# Patient Record
Sex: Female | Born: 1945 | Race: White | Hispanic: No | Marital: Married | Smoking: Never smoker
Health system: Southern US, Community
[De-identification: ages and names within clinical notes are randomized; demographics above are authoritative.]

## PROBLEM LIST (undated history)

## (undated) DIAGNOSIS — E039 Hypothyroidism, unspecified: Secondary | ICD-10-CM

## (undated) DIAGNOSIS — R51 Headache: Secondary | ICD-10-CM

## (undated) DIAGNOSIS — Z9889 Other specified postprocedural states: Secondary | ICD-10-CM

## (undated) DIAGNOSIS — C801 Malignant (primary) neoplasm, unspecified: Secondary | ICD-10-CM

## (undated) DIAGNOSIS — R112 Nausea with vomiting, unspecified: Secondary | ICD-10-CM

## (undated) DIAGNOSIS — R519 Headache, unspecified: Secondary | ICD-10-CM

## (undated) DIAGNOSIS — K219 Gastro-esophageal reflux disease without esophagitis: Secondary | ICD-10-CM

## (undated) DIAGNOSIS — M1990 Unspecified osteoarthritis, unspecified site: Secondary | ICD-10-CM

## (undated) DIAGNOSIS — C50919 Malignant neoplasm of unspecified site of unspecified female breast: Secondary | ICD-10-CM

## (undated) DIAGNOSIS — E079 Disorder of thyroid, unspecified: Secondary | ICD-10-CM

## (undated) DIAGNOSIS — I10 Essential (primary) hypertension: Secondary | ICD-10-CM

## (undated) HISTORY — PX: TONSILLECTOMY: SUR1361

## (undated) HISTORY — PX: DILATION AND CURETTAGE OF UTERUS: SHX78

## (undated) HISTORY — PX: BREAST SURGERY: SHX581

## (undated) HISTORY — PX: ABDOMINAL HYSTERECTOMY: SHX81

## (undated) HISTORY — PX: CHOLECYSTECTOMY: SHX55

## (undated) HISTORY — DX: Malignant neoplasm of unspecified site of unspecified female breast: C50.919

---

## 1974-12-02 HISTORY — PX: KNEE SURGERY: SHX244

## 1998-10-19 ENCOUNTER — Ambulatory Visit (HOSPITAL_COMMUNITY): Admission: RE | Admit: 1998-10-19 | Discharge: 1998-10-19 | Payer: Self-pay | Admitting: Neurosurgery

## 1998-10-19 ENCOUNTER — Encounter: Payer: Self-pay | Admitting: Neurosurgery

## 1998-11-08 ENCOUNTER — Ambulatory Visit (HOSPITAL_COMMUNITY): Admission: RE | Admit: 1998-11-08 | Discharge: 1998-11-08 | Payer: Self-pay | Admitting: Neurosurgery

## 1998-11-08 ENCOUNTER — Encounter: Payer: Self-pay | Admitting: Neurosurgery

## 1999-12-03 HISTORY — PX: BACK SURGERY: SHX140

## 2004-09-03 ENCOUNTER — Ambulatory Visit: Payer: Self-pay | Admitting: General Surgery

## 2005-09-02 ENCOUNTER — Ambulatory Visit: Payer: Self-pay | Admitting: General Surgery

## 2006-09-10 ENCOUNTER — Ambulatory Visit: Payer: Self-pay | Admitting: General Surgery

## 2006-09-12 ENCOUNTER — Ambulatory Visit: Payer: Self-pay | Admitting: General Surgery

## 2007-10-06 ENCOUNTER — Ambulatory Visit: Payer: Self-pay | Admitting: General Surgery

## 2008-10-11 ENCOUNTER — Ambulatory Visit: Payer: Self-pay | Admitting: General Surgery

## 2009-11-07 ENCOUNTER — Ambulatory Visit: Payer: Self-pay

## 2011-01-22 ENCOUNTER — Ambulatory Visit: Payer: Self-pay

## 2011-08-23 ENCOUNTER — Ambulatory Visit: Payer: Self-pay

## 2012-04-14 ENCOUNTER — Ambulatory Visit: Payer: Self-pay

## 2013-06-08 ENCOUNTER — Ambulatory Visit: Payer: Self-pay

## 2013-12-02 HISTORY — PX: MASTECTOMY: SHX3

## 2014-04-06 DIAGNOSIS — I10 Essential (primary) hypertension: Secondary | ICD-10-CM | POA: Insufficient documentation

## 2014-04-06 DIAGNOSIS — M47816 Spondylosis without myelopathy or radiculopathy, lumbar region: Secondary | ICD-10-CM | POA: Insufficient documentation

## 2014-04-06 DIAGNOSIS — K219 Gastro-esophageal reflux disease without esophagitis: Secondary | ICD-10-CM | POA: Insufficient documentation

## 2014-04-06 DIAGNOSIS — E039 Hypothyroidism, unspecified: Secondary | ICD-10-CM | POA: Insufficient documentation

## 2014-05-09 DIAGNOSIS — J310 Chronic rhinitis: Secondary | ICD-10-CM | POA: Insufficient documentation

## 2014-05-09 DIAGNOSIS — E785 Hyperlipidemia, unspecified: Secondary | ICD-10-CM | POA: Insufficient documentation

## 2014-06-21 ENCOUNTER — Ambulatory Visit: Payer: Self-pay | Admitting: Internal Medicine

## 2014-06-24 ENCOUNTER — Ambulatory Visit: Payer: Self-pay | Admitting: Internal Medicine

## 2014-06-28 ENCOUNTER — Ambulatory Visit: Payer: Self-pay | Admitting: Internal Medicine

## 2014-07-01 ENCOUNTER — Ambulatory Visit: Payer: Self-pay | Admitting: Internal Medicine

## 2014-07-02 ENCOUNTER — Ambulatory Visit: Payer: Self-pay | Admitting: Internal Medicine

## 2014-07-04 LAB — PATHOLOGY REPORT

## 2014-07-13 DIAGNOSIS — C50919 Malignant neoplasm of unspecified site of unspecified female breast: Secondary | ICD-10-CM | POA: Insufficient documentation

## 2014-08-02 ENCOUNTER — Ambulatory Visit: Payer: Self-pay | Admitting: Internal Medicine

## 2015-01-03 DIAGNOSIS — C50911 Malignant neoplasm of unspecified site of right female breast: Secondary | ICD-10-CM | POA: Diagnosis not present

## 2015-01-24 DIAGNOSIS — M25511 Pain in right shoulder: Secondary | ICD-10-CM | POA: Diagnosis not present

## 2015-01-24 DIAGNOSIS — K219 Gastro-esophageal reflux disease without esophagitis: Secondary | ICD-10-CM | POA: Diagnosis not present

## 2015-01-24 DIAGNOSIS — E039 Hypothyroidism, unspecified: Secondary | ICD-10-CM | POA: Diagnosis not present

## 2015-01-24 DIAGNOSIS — I10 Essential (primary) hypertension: Secondary | ICD-10-CM | POA: Diagnosis not present

## 2015-04-04 DIAGNOSIS — C50911 Malignant neoplasm of unspecified site of right female breast: Secondary | ICD-10-CM | POA: Diagnosis not present

## 2015-04-18 DIAGNOSIS — I10 Essential (primary) hypertension: Secondary | ICD-10-CM | POA: Diagnosis not present

## 2015-04-18 DIAGNOSIS — E039 Hypothyroidism, unspecified: Secondary | ICD-10-CM | POA: Diagnosis not present

## 2015-04-18 DIAGNOSIS — E782 Mixed hyperlipidemia: Secondary | ICD-10-CM | POA: Diagnosis not present

## 2015-04-25 DIAGNOSIS — E039 Hypothyroidism, unspecified: Secondary | ICD-10-CM | POA: Diagnosis not present

## 2015-04-25 DIAGNOSIS — E782 Mixed hyperlipidemia: Secondary | ICD-10-CM | POA: Diagnosis not present

## 2015-04-25 DIAGNOSIS — I10 Essential (primary) hypertension: Secondary | ICD-10-CM | POA: Diagnosis not present

## 2015-07-19 DIAGNOSIS — R51 Headache: Secondary | ICD-10-CM | POA: Diagnosis not present

## 2015-07-31 DIAGNOSIS — C50911 Malignant neoplasm of unspecified site of right female breast: Secondary | ICD-10-CM | POA: Diagnosis not present

## 2015-08-17 DIAGNOSIS — C50911 Malignant neoplasm of unspecified site of right female breast: Secondary | ICD-10-CM | POA: Diagnosis not present

## 2015-08-21 DIAGNOSIS — E039 Hypothyroidism, unspecified: Secondary | ICD-10-CM | POA: Diagnosis not present

## 2015-08-28 DIAGNOSIS — K219 Gastro-esophageal reflux disease without esophagitis: Secondary | ICD-10-CM | POA: Diagnosis not present

## 2015-08-28 DIAGNOSIS — Z23 Encounter for immunization: Secondary | ICD-10-CM | POA: Diagnosis not present

## 2015-08-28 DIAGNOSIS — I10 Essential (primary) hypertension: Secondary | ICD-10-CM | POA: Insufficient documentation

## 2015-08-28 DIAGNOSIS — E782 Mixed hyperlipidemia: Secondary | ICD-10-CM | POA: Insufficient documentation

## 2015-08-28 DIAGNOSIS — E039 Hypothyroidism, unspecified: Secondary | ICD-10-CM | POA: Diagnosis not present

## 2015-10-03 DIAGNOSIS — C50911 Malignant neoplasm of unspecified site of right female breast: Secondary | ICD-10-CM | POA: Diagnosis not present

## 2015-11-06 DIAGNOSIS — E039 Hypothyroidism, unspecified: Secondary | ICD-10-CM | POA: Diagnosis not present

## 2015-11-13 DIAGNOSIS — J302 Other seasonal allergic rhinitis: Secondary | ICD-10-CM | POA: Insufficient documentation

## 2015-11-13 DIAGNOSIS — K219 Gastro-esophageal reflux disease without esophagitis: Secondary | ICD-10-CM | POA: Diagnosis not present

## 2015-11-13 DIAGNOSIS — I10 Essential (primary) hypertension: Secondary | ICD-10-CM | POA: Diagnosis not present

## 2015-11-13 DIAGNOSIS — E559 Vitamin D deficiency, unspecified: Secondary | ICD-10-CM | POA: Insufficient documentation

## 2015-11-13 DIAGNOSIS — E039 Hypothyroidism, unspecified: Secondary | ICD-10-CM | POA: Diagnosis not present

## 2015-11-13 DIAGNOSIS — J3089 Other allergic rhinitis: Secondary | ICD-10-CM | POA: Diagnosis not present

## 2015-12-12 DIAGNOSIS — Z91041 Radiographic dye allergy status: Secondary | ICD-10-CM | POA: Diagnosis not present

## 2015-12-12 DIAGNOSIS — L905 Scar conditions and fibrosis of skin: Secondary | ICD-10-CM | POA: Diagnosis not present

## 2015-12-12 DIAGNOSIS — M79621 Pain in right upper arm: Secondary | ICD-10-CM | POA: Diagnosis not present

## 2015-12-12 DIAGNOSIS — G588 Other specified mononeuropathies: Secondary | ICD-10-CM | POA: Diagnosis not present

## 2015-12-12 DIAGNOSIS — L987 Excessive and redundant skin and subcutaneous tissue: Secondary | ICD-10-CM | POA: Diagnosis not present

## 2015-12-12 DIAGNOSIS — C50911 Malignant neoplasm of unspecified site of right female breast: Secondary | ICD-10-CM | POA: Diagnosis not present

## 2015-12-12 DIAGNOSIS — Z853 Personal history of malignant neoplasm of breast: Secondary | ICD-10-CM | POA: Diagnosis not present

## 2015-12-12 DIAGNOSIS — Z885 Allergy status to narcotic agent status: Secondary | ICD-10-CM | POA: Diagnosis not present

## 2015-12-12 DIAGNOSIS — M799 Soft tissue disorder, unspecified: Secondary | ICD-10-CM | POA: Diagnosis not present

## 2015-12-12 DIAGNOSIS — Z882 Allergy status to sulfonamides status: Secondary | ICD-10-CM | POA: Diagnosis not present

## 2015-12-12 DIAGNOSIS — N644 Mastodynia: Secondary | ICD-10-CM | POA: Diagnosis not present

## 2016-01-16 ENCOUNTER — Emergency Department
Admission: EM | Admit: 2016-01-16 | Discharge: 2016-01-16 | Disposition: A | Discharge status: Home / Self Care | Payer: Commercial Managed Care - HMO | Attending: Emergency Medicine | Admitting: Emergency Medicine

## 2016-01-16 ENCOUNTER — Encounter: Payer: Self-pay | Admitting: Emergency Medicine

## 2016-01-16 ENCOUNTER — Emergency Department: Payer: Commercial Managed Care - HMO

## 2016-01-16 DIAGNOSIS — R05 Cough: Secondary | ICD-10-CM

## 2016-01-16 DIAGNOSIS — K429 Umbilical hernia without obstruction or gangrene: Secondary | ICD-10-CM | POA: Diagnosis not present

## 2016-01-16 DIAGNOSIS — R059 Cough, unspecified: Secondary | ICD-10-CM

## 2016-01-16 DIAGNOSIS — R000 Tachycardia, unspecified: Secondary | ICD-10-CM | POA: Diagnosis not present

## 2016-01-16 DIAGNOSIS — I10 Essential (primary) hypertension: Secondary | ICD-10-CM | POA: Insufficient documentation

## 2016-01-16 DIAGNOSIS — R1031 Right lower quadrant pain: Secondary | ICD-10-CM | POA: Diagnosis present

## 2016-01-16 DIAGNOSIS — F419 Anxiety disorder, unspecified: Secondary | ICD-10-CM | POA: Insufficient documentation

## 2016-01-16 DIAGNOSIS — B349 Viral infection, unspecified: Secondary | ICD-10-CM | POA: Insufficient documentation

## 2016-01-16 DIAGNOSIS — Z9011 Acquired absence of right breast and nipple: Secondary | ICD-10-CM | POA: Diagnosis not present

## 2016-01-16 HISTORY — DX: Essential (primary) hypertension: I10

## 2016-01-16 HISTORY — DX: Disorder of thyroid, unspecified: E07.9

## 2016-01-16 HISTORY — DX: Malignant (primary) neoplasm, unspecified: C80.1

## 2016-01-16 LAB — CBC
HEMATOCRIT: 40.4 % (ref 35.0–47.0)
Hemoglobin: 13.7 g/dL (ref 12.0–16.0)
MCH: 29.1 pg (ref 26.0–34.0)
MCHC: 33.8 g/dL (ref 32.0–36.0)
MCV: 86 fL (ref 80.0–100.0)
Platelets: 216 10*3/uL (ref 150–440)
RBC: 4.7 MIL/uL (ref 3.80–5.20)
RDW: 14.1 % (ref 11.5–14.5)
WBC: 7.4 10*3/uL (ref 3.6–11.0)

## 2016-01-16 LAB — COMPREHENSIVE METABOLIC PANEL
ALT: 25 U/L (ref 14–54)
AST: 30 U/L (ref 15–41)
Albumin: 4.1 g/dL (ref 3.5–5.0)
Alkaline Phosphatase: 79 U/L (ref 38–126)
Anion gap: 10 (ref 5–15)
BUN: 19 mg/dL (ref 6–20)
CHLORIDE: 107 mmol/L (ref 101–111)
CO2: 23 mmol/L (ref 22–32)
Calcium: 9.7 mg/dL (ref 8.9–10.3)
Creatinine, Ser: 0.83 mg/dL (ref 0.44–1.00)
GFR calc Af Amer: >60 mL/min (ref >60)
Glucose, Bld: 111 mg/dL — ABNORMAL HIGH (ref 65–99)
POTASSIUM: 3.8 mmol/L (ref 3.5–5.1)
SODIUM: 140 mmol/L (ref 135–145)
Total Bilirubin: 0.5 mg/dL (ref 0.3–1.2)
Total Protein: 7.3 g/dL (ref 6.5–8.1)

## 2016-01-16 LAB — LIPASE, BLOOD: LIPASE: 25 U/L (ref 11–51)

## 2016-01-16 LAB — TROPONIN I
Troponin I: 0.03 ng/mL (ref <0.031)
Troponin I: <0.03 ng/mL (ref <0.031)

## 2016-01-16 MED ORDER — DIPHENHYDRAMINE HCL 50 MG/ML IJ SOLN
INTRAMUSCULAR | Status: AC
  Administered 2016-01-16: 12.5 mg via INTRAVENOUS
  Filled 2016-01-16: qty 1

## 2016-01-16 MED ORDER — DIPHENHYDRAMINE HCL 25 MG PO CAPS
50.0000 mg | ORAL_CAPSULE | Freq: Once | ORAL | Status: AC
  Administered 2016-01-16: 50 mg via ORAL
  Filled 2016-01-16: qty 2

## 2016-01-16 MED ORDER — IOHEXOL 300 MG/ML  SOLN
100.0000 mL | Freq: Once | INTRAMUSCULAR | Status: AC | PRN
  Administered 2016-01-16: 100 mL via INTRAVENOUS

## 2016-01-16 MED ORDER — DIPHENHYDRAMINE HCL 50 MG/ML IJ SOLN
12.5000 mg | Freq: Once | INTRAMUSCULAR | Status: AC
  Administered 2016-01-16: 12.5 mg via INTRAVENOUS

## 2016-01-16 MED ORDER — HYDROCORTISONE NA SUCCINATE PF 100 MG IJ SOLR
200.0000 mg | Freq: Once | INTRAMUSCULAR | Status: AC
  Administered 2016-01-16: 200 mg via INTRAVENOUS
  Filled 2016-01-16: qty 4

## 2016-01-16 MED ORDER — IOHEXOL 240 MG/ML SOLN: 25.0000 mL | Freq: Once | INTRAMUSCULAR | Status: DC | PRN

## 2016-01-16 NOTE — Discharge Instructions (Signed)
Fortunately a thorough evaluation today did not reveal any acute or emergent medical conditions.  You likely is suffering from a viral illness which is causing her to have some discomfort at your postoperative sites.  Please follow-up with your regular doctor tomorrow for the next available follow-up appointment.  We do recommend that you take over-the-counter Benadryl as needed for the next couple of days since she did have an allergic reaction to the IV contrast dye.  We have updated your allergy list in the computer to reflect this allergic reaction.  If he develop new or worsening symptoms, please return immediately to the emergency department.   Viral Infections A viral infection can be caused by different types of viruses.Most viral infections are not serious and resolve on their own. However, some infections may cause severe symptoms and may lead to further complications. SYMPTOMS Viruses can frequently cause:  Minor sore throat.  Aches and pains.  Headaches.  Runny nose.  Different types of rashes.  Watery eyes.  Tiredness.  Cough.  Loss of appetite.  Gastrointestinal infections, resulting in nausea, vomiting, and diarrhea. These symptoms do not respond to antibiotics because the infection is not caused by bacteria. However, you might catch a bacterial infection following the viral infection. This is sometimes called a "superinfection." Symptoms of such a bacterial infection may include:  Worsening sore throat with pus and difficulty swallowing.  Swollen neck glands.  Chills and a high or persistent fever.  Severe headache.  Tenderness over the sinuses.  Persistent overall ill feeling (malaise), muscle aches, and tiredness (fatigue).  Persistent cough.  Yellow, green, or brown mucus production with coughing. HOME CARE INSTRUCTIONS   Only take over-the-counter or prescription medicines for pain, discomfort, diarrhea, or fever as directed by your  caregiver.  Drink enough water and fluids to keep your urine clear or pale yellow. Sports drinks can provide valuable electrolytes, sugars, and hydration.  Get plenty of rest and maintain proper nutrition. Soups and broths with crackers or rice are fine. SEEK IMMEDIATE MEDICAL CARE IF:   You have severe headaches, shortness of breath, chest pain, neck pain, or an unusual rash.  You have uncontrolled vomiting, diarrhea, or you are unable to keep down fluids.  You or your child has an oral temperature above 102 F (38.9 C), not controlled by medicine.  Your baby is older than 3 months with a rectal temperature of 102 F (38.9 C) or higher.  Your baby is 100 months old or younger with a rectal temperature of 100.4 F (38 C) or higher. MAKE SURE YOU:   Understand these instructions.  Will watch your condition.  Will get help right away if you are not doing well or get worse.   This information is not intended to replace advice given to you by your health care provider. Make sure you discuss any questions you have with your health care provider.   Document Released: 08/28/2005 Document Revised: 02/10/2012 Document Reviewed: 04/26/2015 Elsevier Interactive Patient Education [employer].

## 2016-01-16 NOTE — ED Provider Notes (Signed)
[facility] Emergency Department Provider Note  ____________________________________________  Time seen: Approximately 12:04 PM  I have reviewed the triage vital signs and the nursing notes.   HISTORY  Chief Complaint Nausea RLQ pain Cough Post-op chest swelling Hypertension   HPI Margaret Townsend is a 70 y.o. female sent from [facility] for evaluation of HTN and tachycardia.  Patient arrives very anxious, hyperventilating.  She has a history of right-sided mastectomy with a surgical revision approximately 1 month ago at [facility].  She states that she has been having swelling around the sites of her incision over the last week with a feeling of "swelling on the inside" as well.  Over that period of time she has also been developing a cough which is nonproductive but moderate and persistent.  Nothing is making it better and nothing makes it worse.  She has also had nasal congestion and postnasal drip.  She denies shortness of breath and chest pain.  She has been checking her blood pressure and her heart rate regularly and has been concerned that the blood pressure has been "extremely high" with systolic pressures in the 150s.  She has had generalized weakness and fatigue.  She also developed some right lower quadrant abdominal pain yesterday which is persistent, dull, aching, nothing makes it better and nothing makes it worse.    Past Medical History  Diagnosis Date  . Hypertension   . Thyroid disease   . Cancer [facility])     breast    There are no active problems to display for this patient.   Past Surgical History  Procedure Laterality Date  . Breast surgery    . Abdominal hysterectomy    . Mastectomy      No current outpatient prescriptions on file.  Allergies Codeine; Sulfa antibiotics; and Iohexol  No family history on file.  Social History Social History  Substance Use Topics  . Smoking status: Never Smoker   . Smokeless tobacco: None  . Alcohol Use: No     Review of Systems Constitutional: No fever/chills Eyes: No visual changes. ENT: No sore throat.  Nasal congestion, runny nose, postnasal drip. Cardiovascular: Denies chest pain.  Intermittent tachycardia between 100-110 measured at home. Respiratory: Denies shortness of breath.  Persistent nonproductive cough.  Feelings of swelling in the right side of her chest in the area of her recent surgery Gastrointestinal: Right lower quadrant abdominal pain developed yesterday and persistent.  No nausea, no vomiting.  No diarrhea.  No constipation. Genitourinary: Negative for dysuria. Musculoskeletal: Negative for back pain. Skin: Negative for rash. Neurological: Negative for headaches, focal weakness or numbness.  10-point ROS otherwise negative.  ____________________________________________   PHYSICAL EXAM:  VITAL SIGNS: ED Triage Vitals  Enc Vitals Group     BP 01/16/16 1019 158/93 mmHg     Pulse Rate 01/16/16 1019 87     Resp 01/16/16 1019 18     Temp 01/16/16 1019 98.2 F (36.8 C)     Temp Source 01/16/16 1019 Oral     SpO2 01/16/16 1019 100 %     Weight 01/16/16 1019 168 lb (76.204 kg)     Height 01/16/16 1019 5\' 5"  (1.651 m)     Head Cir --      Peak Flow --      Pain Score --      Pain Loc --      Pain Edu? --      Excl. in [place]? --     Constitutional:  Alert and oriented.  Anxious but Well appearing and in no acute distress. Eyes: Conjunctivae are normal. PERRL. EOMI. Head: Atraumatic. Nose: Mild congestion Mouth/Throat: Mucous membranes are moist.  Oropharynx non-erythematous. Neck: No stridor.   Cardiovascular: Normal rate, regular rhythm. Grossly normal heart sounds.  Good peripheral circulation.  The patient is status post right-sided mastectomy with well-appearing surgical wounds.  I do not palpate any fluctuance around the wound edges, there is no dehiscence, and there is mild tenderness to palpation.  There is no evidence of cellulitis. Respiratory: Normal  respiratory effort.  No retractions. Lungs CTAB. Gastrointestinal: Soft with RLQ tenderness to palpation. No distention. No rebound nor guarding.  No abdominal bruits. No CVA tenderness. Musculoskeletal: No lower extremity tenderness nor edema.  No joint effusions. Neurologic:  Normal speech and language. No gross focal neurologic deficits are appreciated.  Skin:  Skin is warm, dry and intact. No rash noted. Psychiatric: Mood and affect are slightly anxious but otherwise normal. Speech and behavior are normal.  ____________________________________________   LABS (all labs ordered are listed, but only abnormal results are displayed)  Labs Reviewed  COMPREHENSIVE METABOLIC PANEL - Abnormal; Notable for the following:    Glucose, Bld 111 (*)    All other components within normal limits  LIPASE, BLOOD  CBC  TROPONIN I  TROPONIN I   ____________________________________________  EKG  ED ECG REPORT I, Janiylah Hannis, the attending physician, personally viewed and interpreted this ECG.  Date: 01/16/2016 EKG Time: 10:09 Rate: 95 Rhythm: normal sinus rhythm QRS Axis: normal Intervals: normal ST/T Wave abnormalities: normal Conduction Disturbances: none Narrative Interpretation: unremarkable  ____________________________________________  RADIOLOGY   Ct Chest W Contrast  01/16/2016  CLINICAL DATA:  Hyperventilation, high blood pressure today. EXAM: CT ABDOMEN AND PELVIS WITH CONTRAST TECHNIQUE: Multidetector CT imaging of the abdomen and pelvis was performed using the standard protocol following bolus administration of intravenous contrast. CONTRAST:  173mL OMNIPAQUE IOHEXOL 300 MG/ML  SOLN Patient broke out in hives after the examination. The patient was returned to the ER department in stable condition. The ER doctor was notified by the CT technologist. COMPARISON:  Chest x-ray August 23, 2011 FINDINGS: The heart size is normal. There is no pericardial effusion. There is no  mediastinal hilar lymphadenopathy. There is atherosclerosis of the aorta without dissection or aneurysm. Patient status post prior right mastectomy. Images of the lungs demonstrate minimal dependent atelectasis of the posterior lung bases. There is no moped pulmonary nodule/mass. There is no pleural effusion. There is no focal pneumonia. Patient status post prior cholecystectomy with mild intrahepatic and extra hepatic biliary ductal dilatation, postsurgical. The liver is otherwise normal. No focal liver lesion is identified. There is a 1 cm low-density lesion in the spleen possibly a cyst. The spleen is otherwise normal. The pancreas is normal. The adrenal glands and kidneys are normal. There is no hydronephrosis bilaterally. No focal renal lesion is noted. There is atherosclerosis of the abdominal aorta without aneurysmal dilatation. There is no abdominal lymphadenopathy. There is no small bowel obstruction or diverticulitis. The appendix is not seen but no inflammation is noted around cecum. Moderate bowel content is identified throughout colon. Fluid-filled bladder is normal. Patient status post prior hysterectomy. There is small umbilical herniation of mesenteric fat. There are scoliosis and degenerative joint changes of the spine. IMPRESSION: No acute abnormality identified in the chest, abdomen, and pelvis. Status post prior cholecystectomy with mild intra and extrahepatic postsurgical biliary ductal dilatation. Small splenic cyst. Small umbilical herniation of mesenteric fat.  Electronically Signed   By: Abelardo Diesel M.D.   On: 01/16/2016 16:15   Ct Abdomen Pelvis W Contrast  01/16/2016  CLINICAL DATA:  Hyperventilation, high blood pressure today. EXAM: CT ABDOMEN AND PELVIS WITH CONTRAST TECHNIQUE: Multidetector CT imaging of the abdomen and pelvis was performed using the standard protocol following bolus administration of intravenous contrast. CONTRAST:  138mL OMNIPAQUE IOHEXOL 300 MG/ML  SOLN Patient  broke out in hives after the examination. The patient was returned to the ER department in stable condition. The ER doctor was notified by the CT technologist. COMPARISON:  Chest x-ray August 23, 2011 FINDINGS: The heart size is normal. There is no pericardial effusion. There is no mediastinal hilar lymphadenopathy. There is atherosclerosis of the aorta without dissection or aneurysm. Patient status post prior right mastectomy. Images of the lungs demonstrate minimal dependent atelectasis of the posterior lung bases. There is no moped pulmonary nodule/mass. There is no pleural effusion. There is no focal pneumonia. Patient status post prior cholecystectomy with mild intrahepatic and extra hepatic biliary ductal dilatation, postsurgical. The liver is otherwise normal. No focal liver lesion is identified. There is a 1 cm low-density lesion in the spleen possibly a cyst. The spleen is otherwise normal. The pancreas is normal. The adrenal glands and kidneys are normal. There is no hydronephrosis bilaterally. No focal renal lesion is noted. There is atherosclerosis of the abdominal aorta without aneurysmal dilatation. There is no abdominal lymphadenopathy. There is no small bowel obstruction or diverticulitis. The appendix is not seen but no inflammation is noted around cecum. Moderate bowel content is identified throughout colon. Fluid-filled bladder is normal. Patient status post prior hysterectomy. There is small umbilical herniation of mesenteric fat. There are scoliosis and degenerative joint changes of the spine. IMPRESSION: No acute abnormality identified in the chest, abdomen, and pelvis. Status post prior cholecystectomy with mild intra and extrahepatic postsurgical biliary ductal dilatation. Small splenic cyst. Small umbilical herniation of mesenteric fat. Electronically Signed   By: Abelardo Diesel M.D.   On: 01/16/2016 16:15    ____________________________________________   PROCEDURES  Procedure(s)  performed: None  Critical Care performed: No ____________________________________________   INITIAL IMPRESSION / ASSESSMENT AND PLAN / ED COURSE  Pertinent labs & imaging results that were available during my care of the patient were reviewed by me and considered in my medical decision making (see chart for details).  The patient is very concerned about swelling that has developed over the last week in her postoperative areas of her chest after the surgery about 1 month ago.  She has been having intermittent hypertension and cough as well.  She has not had any leg swelling or pain.  She is also developed right lower quadrant tenderness to palpation.  Her labs are generally reassuring her vitals are stable at this time.  I provided reassurance but I will further investigate with a CT scan with IV contrast of her chest and a CT scan of her abdomen and pelvis with oral and IV contrast to further evaluate possible pneumonia, possible postoperative seroma/infection, and to investigate her right lower quadrant pain.  The patient and daughter strongly agree with this plan of very much appreciate a thorough evaluation as they are both very concerned that the patient has a serious medical problem at this time.  ----------------------------------------- 2:17 PM on 01/16/2016 -----------------------------------------  The patient reports a distant history of possible IV contrast allergic reaction.  As per radiology protocol, I ordered 200 mg of hydrocortisone IV and Benadryl 50  mg by mouth.  The CT scans with a place an hour later.  I discussed with the patient and she has no memory of what the allergic reaction may have been (and this was in the late 1960s), and I have a very low suspicion that she had a severe or anaphylactic reaction.  I will proceed as described above.  ----------------------------------------- 4:58 PM on 01/16/2016 ----------------------------------------- (Note that documentation  was delayed due to multiple ED patients requiring immediate care.)  The patient did develop hives after receiving the IV contrast.  I made sure that her allergy list was updated.  She received another Benadryl 12.5 mg IV immediate after returning from CT scan.  At this time, several hours later, she is in no acute distress and the hives have resolved.  I gave the patient my usual recommendations are continue to take Benadryl as needed for the next couple of days.  I do not think she would benefit from a prescription of prednisone as she has had no evidence of anaphylaxis and no airway compromise.  The patient's workup was unremarkable with 2 negative troponins and unremarkable chest and abdomen CTs.  I provided reassurance and explained that I think most likely her symptoms are the result of a viral syndrome which is a cough which exacerbated the operative chest wall.  I advised her to follow up with her primary care doctor tomorrow.  She and her family understand and agree with this plan.    ____________________________________________  FINAL CLINICAL IMPRESSION(S) / ED DIAGNOSES  Final diagnoses:  Viral syndrome  Cough      NEW MEDICATIONS STARTED DURING THIS VISIT:  New Prescriptions   No medications on file     Hinda Kehr, MD 01/16/16 1659

## 2016-01-16 NOTE — ED Notes (Signed)
Pt here sent from [facility] clinic with c/o high blood pressure (taken at home). Was very anxious upon initial assessment, hyperventilating, [facility] told her that her heart rate was 110 and she was very upset at the high number. Pt reassured that she was within normal heart range, EKG obtained, NSR at 95, pt encouraged to breathe through her nose, sats 100% on RA, pt began to calm down. States she has a cold, and some congestion. States her arms and legs still feel heavy and tired.

## 2016-01-17 DIAGNOSIS — Z853 Personal history of malignant neoplasm of breast: Secondary | ICD-10-CM | POA: Diagnosis not present

## 2016-01-17 DIAGNOSIS — Z885 Allergy status to narcotic agent status: Secondary | ICD-10-CM | POA: Diagnosis not present

## 2016-01-17 DIAGNOSIS — Z881 Allergy status to other antibiotic agents status: Secondary | ICD-10-CM | POA: Diagnosis not present

## 2016-01-17 DIAGNOSIS — N12 Tubulo-interstitial nephritis, not specified as acute or chronic: Secondary | ICD-10-CM | POA: Diagnosis not present

## 2016-01-17 DIAGNOSIS — Z91041 Radiographic dye allergy status: Secondary | ICD-10-CM | POA: Diagnosis not present

## 2016-01-17 DIAGNOSIS — Z880 Allergy status to penicillin: Secondary | ICD-10-CM | POA: Diagnosis not present

## 2016-01-17 DIAGNOSIS — Z79899 Other long term (current) drug therapy: Secondary | ICD-10-CM | POA: Diagnosis not present

## 2016-01-19 DIAGNOSIS — R5381 Other malaise: Secondary | ICD-10-CM | POA: Diagnosis not present

## 2016-01-19 DIAGNOSIS — R109 Unspecified abdominal pain: Secondary | ICD-10-CM | POA: Diagnosis not present

## 2016-01-19 DIAGNOSIS — M545 Low back pain: Secondary | ICD-10-CM | POA: Diagnosis not present

## 2016-01-19 DIAGNOSIS — R42 Dizziness and giddiness: Secondary | ICD-10-CM | POA: Diagnosis not present

## 2016-01-19 DIAGNOSIS — R102 Pelvic and perineal pain: Secondary | ICD-10-CM | POA: Diagnosis not present

## 2016-01-22 ENCOUNTER — Other Ambulatory Visit: Payer: Self-pay | Admitting: Physician Assistant

## 2016-01-22 DIAGNOSIS — M545 Low back pain, unspecified: Secondary | ICD-10-CM

## 2016-01-23 ENCOUNTER — Ambulatory Visit
Admission: RE | Admit: 2016-01-23 | Discharge: 2016-01-23 | Disposition: A | Discharge status: Home / Self Care | Payer: Commercial Managed Care - HMO | Source: Ambulatory Visit | Attending: Physician Assistant | Admitting: Physician Assistant

## 2016-01-23 DIAGNOSIS — M545 Low back pain, unspecified: Secondary | ICD-10-CM

## 2016-01-23 DIAGNOSIS — M4806 Spinal stenosis, lumbar region: Secondary | ICD-10-CM | POA: Diagnosis not present

## 2016-01-24 DIAGNOSIS — E039 Hypothyroidism, unspecified: Secondary | ICD-10-CM | POA: Diagnosis not present

## 2016-01-25 DIAGNOSIS — I10 Essential (primary) hypertension: Secondary | ICD-10-CM | POA: Diagnosis not present

## 2016-01-25 DIAGNOSIS — K219 Gastro-esophageal reflux disease without esophagitis: Secondary | ICD-10-CM | POA: Diagnosis not present

## 2016-01-25 DIAGNOSIS — E039 Hypothyroidism, unspecified: Secondary | ICD-10-CM | POA: Diagnosis not present

## 2016-01-30 DIAGNOSIS — M5416 Radiculopathy, lumbar region: Secondary | ICD-10-CM | POA: Diagnosis not present

## 2016-02-07 ENCOUNTER — Other Ambulatory Visit: Payer: Self-pay | Admitting: Neurosurgery

## 2016-02-07 DIAGNOSIS — M5416 Radiculopathy, lumbar region: Secondary | ICD-10-CM | POA: Diagnosis not present

## 2016-02-09 ENCOUNTER — Ambulatory Visit
Admission: RE | Admit: 2016-02-09 | Discharge: 2016-02-09 | Disposition: A | Discharge status: Home / Self Care | Payer: Commercial Managed Care - HMO | Source: Ambulatory Visit | Attending: Neurosurgery | Admitting: Neurosurgery

## 2016-02-09 ENCOUNTER — Other Ambulatory Visit: Payer: Self-pay

## 2016-02-09 DIAGNOSIS — M5126 Other intervertebral disc displacement, lumbar region: Secondary | ICD-10-CM | POA: Diagnosis not present

## 2016-02-09 DIAGNOSIS — M5416 Radiculopathy, lumbar region: Secondary | ICD-10-CM

## 2016-02-09 MED ORDER — IOHEXOL 180 MG/ML  SOLN
15.0000 mL | Freq: Once | INTRAMUSCULAR | Status: AC | PRN
  Administered 2016-02-09: 15 mL via INTRATHECAL

## 2016-02-09 MED ORDER — KETOROLAC TROMETHAMINE 30 MG/ML IJ SOLN
30.0000 mg | Freq: Once | INTRAMUSCULAR | Status: AC
  Administered 2016-02-09: 30 mg via INTRAMUSCULAR

## 2016-02-09 MED ORDER — DIAZEPAM 5 MG PO TABS
5.0000 mg | ORAL_TABLET | Freq: Once | ORAL | Status: AC
  Administered 2016-02-09: 5 mg via ORAL

## 2016-02-09 NOTE — Discharge Instructions (Signed)

## 2016-02-09 NOTE — Progress Notes (Signed)
Patient states she pre-medicated with Benadryl 50mg  PO an hour before procedure.

## 2016-02-19 DIAGNOSIS — I10 Essential (primary) hypertension: Secondary | ICD-10-CM | POA: Diagnosis not present

## 2016-02-19 DIAGNOSIS — Z6828 Body mass index (BMI) 28.0-28.9, adult: Secondary | ICD-10-CM | POA: Diagnosis not present

## 2016-02-19 DIAGNOSIS — M5416 Radiculopathy, lumbar region: Secondary | ICD-10-CM | POA: Diagnosis not present

## 2016-02-20 ENCOUNTER — Other Ambulatory Visit: Payer: Self-pay | Admitting: Neurosurgery

## 2016-02-20 DIAGNOSIS — M5416 Radiculopathy, lumbar region: Secondary | ICD-10-CM

## 2016-02-23 ENCOUNTER — Other Ambulatory Visit: Payer: Self-pay | Admitting: Neurosurgery

## 2016-02-23 ENCOUNTER — Ambulatory Visit
Admission: RE | Admit: 2016-02-23 | Discharge: 2016-02-23 | Disposition: A | Discharge status: Home / Self Care | Payer: Commercial Managed Care - HMO | Source: Ambulatory Visit | Attending: Neurosurgery | Admitting: Neurosurgery

## 2016-02-23 DIAGNOSIS — M5416 Radiculopathy, lumbar region: Secondary | ICD-10-CM

## 2016-02-23 DIAGNOSIS — M5417 Radiculopathy, lumbosacral region: Secondary | ICD-10-CM | POA: Diagnosis not present

## 2016-02-23 MED ORDER — IOHEXOL 180 MG/ML  SOLN
1.0000 mL | Freq: Once | INTRAMUSCULAR | Status: AC | PRN
  Administered 2016-02-23: 1 mL via EPIDURAL

## 2016-02-23 MED ORDER — METHYLPREDNISOLONE ACETATE 40 MG/ML INJ SUSP (RADIOLOG
120.0000 mg | Freq: Once | INTRAMUSCULAR | Status: AC
  Administered 2016-02-23: 120 mg via EPIDURAL

## 2016-02-23 NOTE — Discharge Instructions (Signed)

## 2016-02-27 DIAGNOSIS — E039 Hypothyroidism, unspecified: Secondary | ICD-10-CM | POA: Diagnosis not present

## 2016-02-27 DIAGNOSIS — I10 Essential (primary) hypertension: Secondary | ICD-10-CM | POA: Diagnosis not present

## 2016-02-27 DIAGNOSIS — E559 Vitamin D deficiency, unspecified: Secondary | ICD-10-CM | POA: Diagnosis not present

## 2016-02-29 DIAGNOSIS — E039 Hypothyroidism, unspecified: Secondary | ICD-10-CM | POA: Diagnosis not present

## 2016-02-29 DIAGNOSIS — R5382 Chronic fatigue, unspecified: Secondary | ICD-10-CM | POA: Diagnosis not present

## 2016-04-02 DIAGNOSIS — C50911 Malignant neoplasm of unspecified site of right female breast: Secondary | ICD-10-CM | POA: Diagnosis not present

## 2016-04-08 DIAGNOSIS — E039 Hypothyroidism, unspecified: Secondary | ICD-10-CM | POA: Diagnosis not present

## 2016-04-15 DIAGNOSIS — Z0000 Encounter for general adult medical examination without abnormal findings: Secondary | ICD-10-CM | POA: Diagnosis not present

## 2016-04-15 DIAGNOSIS — Z124 Encounter for screening for malignant neoplasm of cervix: Secondary | ICD-10-CM | POA: Diagnosis not present

## 2016-04-24 DIAGNOSIS — Z0000 Encounter for general adult medical examination without abnormal findings: Secondary | ICD-10-CM | POA: Diagnosis not present

## 2016-08-06 DIAGNOSIS — S53125A Posterior dislocation of left ulnohumeral joint, initial encounter: Secondary | ICD-10-CM | POA: Diagnosis not present

## 2016-08-06 DIAGNOSIS — R0989 Other specified symptoms and signs involving the circulatory and respiratory systems: Secondary | ICD-10-CM | POA: Diagnosis not present

## 2016-08-06 DIAGNOSIS — M50321 Other cervical disc degeneration at C4-C5 level: Secondary | ICD-10-CM | POA: Diagnosis not present

## 2016-08-06 DIAGNOSIS — S63005A Unspecified dislocation of left wrist and hand, initial encounter: Secondary | ICD-10-CM | POA: Diagnosis not present

## 2016-08-06 DIAGNOSIS — S199XXA Unspecified injury of neck, initial encounter: Secondary | ICD-10-CM | POA: Diagnosis not present

## 2016-08-06 DIAGNOSIS — R200 Anesthesia of skin: Secondary | ICD-10-CM | POA: Diagnosis not present

## 2016-08-06 DIAGNOSIS — S0990XA Unspecified injury of head, initial encounter: Secondary | ICD-10-CM | POA: Diagnosis not present

## 2016-08-06 DIAGNOSIS — W109XXA Fall (on) (from) unspecified stairs and steps, initial encounter: Secondary | ICD-10-CM | POA: Diagnosis not present

## 2016-08-06 DIAGNOSIS — S4992XA Unspecified injury of left shoulder and upper arm, initial encounter: Secondary | ICD-10-CM | POA: Diagnosis not present

## 2016-08-06 DIAGNOSIS — M19012 Primary osteoarthritis, left shoulder: Secondary | ICD-10-CM | POA: Diagnosis not present

## 2016-08-06 DIAGNOSIS — M79602 Pain in left arm: Secondary | ICD-10-CM | POA: Diagnosis not present

## 2016-08-06 DIAGNOSIS — W19XXXA Unspecified fall, initial encounter: Secondary | ICD-10-CM | POA: Diagnosis not present

## 2016-08-06 DIAGNOSIS — M25552 Pain in left hip: Secondary | ICD-10-CM | POA: Diagnosis not present

## 2016-08-06 DIAGNOSIS — M25512 Pain in left shoulder: Secondary | ICD-10-CM | POA: Diagnosis not present

## 2016-08-06 DIAGNOSIS — R55 Syncope and collapse: Secondary | ICD-10-CM | POA: Diagnosis not present

## 2016-08-06 DIAGNOSIS — M79605 Pain in left leg: Secondary | ICD-10-CM | POA: Diagnosis not present

## 2016-08-06 DIAGNOSIS — S0083XA Contusion of other part of head, initial encounter: Secondary | ICD-10-CM | POA: Diagnosis not present

## 2016-08-06 DIAGNOSIS — S0219XA Other fracture of base of skull, initial encounter for closed fracture: Secondary | ICD-10-CM | POA: Diagnosis not present

## 2016-08-06 DIAGNOSIS — S53105A Unspecified dislocation of left ulnohumeral joint, initial encounter: Secondary | ICD-10-CM | POA: Diagnosis not present

## 2016-08-06 DIAGNOSIS — R202 Paresthesia of skin: Secondary | ICD-10-CM | POA: Diagnosis not present

## 2016-08-06 DIAGNOSIS — S6992XA Unspecified injury of left wrist, hand and finger(s), initial encounter: Secondary | ICD-10-CM | POA: Diagnosis not present

## 2016-08-06 DIAGNOSIS — R000 Tachycardia, unspecified: Secondary | ICD-10-CM | POA: Diagnosis not present

## 2016-08-06 DIAGNOSIS — M7989 Other specified soft tissue disorders: Secondary | ICD-10-CM | POA: Diagnosis not present

## 2016-08-06 DIAGNOSIS — M25562 Pain in left knee: Secondary | ICD-10-CM | POA: Diagnosis not present

## 2016-08-07 DIAGNOSIS — S53125A Posterior dislocation of left ulnohumeral joint, initial encounter: Secondary | ICD-10-CM | POA: Diagnosis not present

## 2016-08-15 DIAGNOSIS — S53105A Unspecified dislocation of left ulnohumeral joint, initial encounter: Secondary | ICD-10-CM | POA: Diagnosis not present

## 2016-08-15 DIAGNOSIS — S59902A Unspecified injury of left elbow, initial encounter: Secondary | ICD-10-CM | POA: Diagnosis not present

## 2016-08-15 DIAGNOSIS — M7989 Other specified soft tissue disorders: Secondary | ICD-10-CM | POA: Diagnosis not present

## 2016-08-15 DIAGNOSIS — M25532 Pain in left wrist: Secondary | ICD-10-CM | POA: Diagnosis not present

## 2016-08-15 DIAGNOSIS — S42402A Unspecified fracture of lower end of left humerus, initial encounter for closed fracture: Secondary | ICD-10-CM | POA: Diagnosis not present

## 2016-08-21 ENCOUNTER — Encounter: Payer: Commercial Managed Care - HMO | Admitting: Occupational Therapy

## 2016-08-23 ENCOUNTER — Ambulatory Visit: Payer: Commercial Managed Care - HMO | Attending: Family | Admitting: Occupational Therapy

## 2016-08-23 DIAGNOSIS — M25522 Pain in left elbow: Secondary | ICD-10-CM | POA: Insufficient documentation

## 2016-08-23 DIAGNOSIS — M25622 Stiffness of left elbow, not elsewhere classified: Secondary | ICD-10-CM | POA: Diagnosis not present

## 2016-08-23 NOTE — Patient Instructions (Signed)
Heat  AROM in supine for elbow flexion and extention  Stop when feeling pull - pain less than 2/10 Ice at end 3-5 x day

## 2016-08-23 NOTE — Therapy (Signed)
[place] [facility] [address] [place], [address] Phone: [phone]   Fax:  [phone]  Occupational Therapy Treatment  Patient Details  Name: Margaret Townsend MRN: [id number] Date of Birth: [birth year] Referring Provider: Collier Bullock  Encounter Date: 08/23/2016      OT End of Session - 08/23/16 0847    Visit Number 1   Number of Visits 16   Date for OT Re-Evaluation 10/18/16   OT Start Time 0821   OT Stop Time 0916   OT Time Calculation (min) 55 min   Activity Tolerance Patient tolerated treatment well   Behavior During Therapy [facility] for tasks assessed/performed      Past Medical History:  Diagnosis Date  . Cancer (HCC)    breast  . Hypertension   . Thyroid disease     Past Surgical History:  Procedure Laterality Date  . ABDOMINAL HYSTERECTOMY    . BREAST SURGERY    . MASTECTOMY      There were no vitals filed for this visit.      Subjective Assessment - 08/23/16 0830    Subjective   Fell on 9/5 - reduction same day - was immobilize until 9/14- refer to therapy -    Patient Stated Goals Want to get the motion and use of L hand and arm back - to cut hair, put contact lenses, sewing , yard work , dressing ,    Currently in Pain? Yes   Pain Score 2    Pain Location Wrist   Pain Orientation Left   Pain Descriptors / Indicators Aching;Sore   Pain Type Acute pain   Pain Onset 1 to 4 weeks ago   Pain Frequency Intermittent            [facility] OT Assessment - 08/23/16 0001      Assessment   Diagnosis L elbow dislocation    Referring Provider Collier Bullock   Onset Date 08/06/16     Balance Screen   Has the patient fallen in the past 6 months Yes   How many times? 1   Has the patient had a decrease in activity level because of a fear of falling?  No   Is the patient reluctant to leave their home because of a fear of falling?  No     Home  Environment   Lives With Spouse     Prior  Function   Vocation Part time employment   Leisure likes to sew, yard work ,  work part time beautician      AROM   Left Elbow Flexion 120       heat done over elbow  Gentle AROM for elbow flexion and extention - neutral position - slight pull  10 reps  Reviewed HEP  Tight and tender over distal bicep                   OT Education - 08/23/16 0847    Education provided Yes   Education Details HEP    Person(s) Educated Patient   Methods Explanation;Demonstration;Tactile cues;Verbal cues   Comprehension Verbal cues required;Returned demonstration;Verbalized understanding          OT Short Term Goals - 08/23/16 1112      OT SHORT TERM GOAL #1   Title Pt to be ind in HEP to increase AROM in L elbow flexion and extention to put hand in pocket and put contact lenses in  Baseline flexion 120 , ext -70   Time 4   Period Weeks   Status New           OT Long Term Goals - 08/23/16 1114      OT LONG TERM GOAL #1   Title Upgrade goals to increase strength  in L Elbow to increase functional use of L arm   Baseline only gentle PROM and AROM to elbow   Time 4   Period Weeks   Status New     OT LONG TERM GOAL #2   Title Assess wrist AROM when ordered by MD    Baseline Wrist in splint - lunate fx   Time 4   Period Weeks   Status New               Plan - 08/23/16 0849    Clinical Impression Statement Pt present 2 1/2 wks out from reduction of posterior disclocation at L elbow - pt in hinged elbow splint - off for ADL's , ROM , - in wrist splint secondary to possible lunate fx - orders from MD only for gentle PROM and AROM to L elbow - no weight bearing - pt report strap around neck hurts it and then shoulder hurts some - - report pain at wrist more than elbow actually - pt limited in use of L hand in all activities - pain about 3/10 at the worse at elbow - but it is in hinge splint all the time - pt can benefit from OT services to increase ROM     Rehab Potential Good   OT Frequency 2x / week   OT Duration 8 weeks   OT Treatment/Interventions Self-care/ADL training;Moist Heat;Fluidtherapy;Patient/family education;Splinting;Therapeutic exercises;Contrast Bath;Manual Therapy;Passive range of motion   Plan assess how doing with HEP   OT Home Exercise Plan see pt instruction    Consulted and Agree with Plan of Care Patient      Patient will benefit from skilled therapeutic intervention in order to improve the following deficits and impairments:  Decreased range of motion, Impaired flexibility, Increased edema, Impaired UE functional use, Pain, Decreased strength  Visit Diagnosis: Pain in left elbow - Plan: Ot plan of care cert/re-cert  Stiffness of left elbow, not elsewhere classified - Plan: Ot plan of care cert/re-cert    Problem List Patient Active Problem List   Diagnosis Date Noted  . Allergic rhinitis, seasonal 11/13/2015  . Avitaminosis D 11/13/2015  . Essential (primary) hypertension 08/28/2015  . Gastro-esophageal reflux disease without esophagitis 08/28/2015  . Combined fat and carbohydrate induced hyperlipemia 08/28/2015  . Malignant neoplasm of breast ([place]) 07/13/2014  . HLD (hyperlipidemia) 05/09/2014  . Inflamed nasal mucosa 05/09/2014  . Acid reflux 04/06/2014  . BP (high blood pressure) 04/06/2014  . Adult hypothyroidism 04/06/2014  . Degenerative arthritis of lumbar spine 04/06/2014    Rosalyn Gess OTR/L,CLT 08/23/2016, 11:22 AM  [place] [facility] [address] [place], [address] Phone: [phone]   Fax:  [phone]  Name: Margaret Townsend MRN: [id number] Date of Birth: 1946/07/20

## 2016-08-27 ENCOUNTER — Ambulatory Visit: Payer: Commercial Managed Care - HMO | Admitting: Occupational Therapy

## 2016-08-27 DIAGNOSIS — M25622 Stiffness of left elbow, not elsewhere classified: Secondary | ICD-10-CM | POA: Diagnosis not present

## 2016-08-27 DIAGNOSIS — M25522 Pain in left elbow: Secondary | ICD-10-CM | POA: Diagnosis not present

## 2016-08-27 NOTE — Patient Instructions (Addendum)
Same HEP for AROM   Add Scapula retraction 10 reps  Cervical rotation billateral  Lat cervical flexion R  And scalenes stretch to R  Add to HEP

## 2016-08-27 NOTE — Therapy (Signed)
[place] [facility] [address] [place], [address] Phone: [phone]   Fax:  [phone]  Occupational Therapy Treatment  Patient Details  Name: Margaret Townsend MRN: [id number] Date of Birth: [birth year] Referring Provider: Collier Bullock  Encounter Date: 08/27/2016      OT End of Session - 08/27/16 1228    Visit Number 2   Number of Visits 16   Date for OT Re-Evaluation 10/18/16   OT Start Time 1207   OT Stop Time 1256   OT Time Calculation (min) 49 min   Activity Tolerance Patient tolerated treatment well   Behavior During Therapy [facility] for tasks assessed/performed      Past Medical History:  Diagnosis Date  . Cancer (HCC)    breast  . Hypertension   . Thyroid disease     Past Surgical History:  Procedure Laterality Date  . ABDOMINAL HYSTERECTOMY    . BREAST SURGERY    . MASTECTOMY      There were no vitals filed for this visit.      Subjective Assessment - 08/27/16 1227    Subjective  Did okay - it hurts only really when I do my exercises - pain about 1/10 - still a lot of swelling at the elbow    Patient Stated Goals Want to get the motion and use of L hand and arm back - to cut hair, put contact lenses, sewing , yard work , dressing ,    Currently in Pain? Yes   Pain Score 1    Pain Location Elbow   Pain Orientation Left   Pain Descriptors / Indicators Aching;Sore   Pain Type Acute pain   Pain Onset 1 to 4 weeks ago                      OT Treatments/Exercises (OP) - 08/27/16 0001      Moist Heat Therapy   Number Minutes Moist Heat 10 Minutes   Moist Heat Location Elbow      AROM measured flexion and extention In SUPINE  heat done over elbow in extention with pillow under arm for support Soft tissue mobs over bicep and volar upper arm - proximal more than distal - tender and some trigger points distally   Gentle AROM for elbow flexion and extention - neutral  position - slight pull  10 reps  Reviewed HEP with pt  In supine  AAROM for shoulder flexion reaching over head in supine - with forearm supported - able to touch top of head  Reaching for face - simulate pt maybe able to assist with L in supine to keep eye open to get contact lenses in  - can prop upper arm onto pillow  Scapula retraction 10 reps  Cervical rotation billateral  Lat cervical flexion R  And scalenes stretch to R  Add to HEP              OT Education - 08/27/16 2009    Education provided Yes   Education Details HEP and injury    Person(s) Educated Patient;Other (comment)   Methods Explanation;Demonstration;Tactile cues;Verbal cues   Comprehension Verbal cues required;Verbalized understanding;Returned demonstration          OT Short Term Goals - 08/23/16 1112      OT SHORT TERM GOAL #1   Title Pt to be ind in HEP to increase AROM in L elbow flexion and extention to put hand  in pocket and put contact lenses in    Baseline flexion 120 , ext -70   Time 4   Period Weeks   Status New           OT Long Term Goals - 08/23/16 1114      OT LONG TERM GOAL #1   Title Upgrade goals to increase strength  in L Elbow to increase functional use of L arm   Baseline only gentle PROM and AROM to elbow   Time 4   Period Weeks   Status New     OT LONG TERM GOAL #2   Title Assess wrist AROM when ordered by MD    Baseline Wrist in splint - lunate fx   Time 4   Period Weeks   Status New               Plan - 08/27/16 1229    Clinical Impression Statement Pt arrive reporting still dizzy because of not able to wear her contact and wearing old glasses - simulated in supine to using L hand to open eye - can prop upper arm on pillow - Pt showed increase AROM at elbow this date - -50 ext coming in and 125 flexion - pain about 1/10  per pt - pt showed increase flexion and extention in session as well as coming in with  upper traps spasm L worse than R - added  some stretch and scapula squeezes  for posture    Rehab Potential Good   OT Frequency 2x / week   OT Duration 8 weeks   OT Treatment/Interventions Self-care/ADL training;Moist Heat;Fluidtherapy;Patient/family education;Splinting;Therapeutic exercises;Contrast Bath;Manual Therapy;Passive range of motion   Plan assess progress with HEP and L upper traps    OT Home Exercise Plan see pt instruction    Consulted and Agree with Plan of Care Patient      Patient will benefit from skilled therapeutic intervention in order to improve the following deficits and impairments:  Decreased range of motion, Impaired flexibility, Increased edema, Impaired UE functional use, Pain, Decreased strength  Visit Diagnosis: Pain in left elbow  Stiffness of left elbow, not elsewhere classified    Problem List Patient Active Problem List   Diagnosis Date Noted  . Allergic rhinitis, seasonal 11/13/2015  . Avitaminosis D 11/13/2015  . Essential (primary) hypertension 08/28/2015  . Gastro-esophageal reflux disease without esophagitis 08/28/2015  . Combined fat and carbohydrate induced hyperlipemia 08/28/2015  . Malignant neoplasm of breast ([place]) 07/13/2014  . HLD (hyperlipidemia) 05/09/2014  . Inflamed nasal mucosa 05/09/2014  . Acid reflux 04/06/2014  . BP (high blood pressure) 04/06/2014  . Adult hypothyroidism 04/06/2014  . Degenerative arthritis of lumbar spine 04/06/2014    Rosalyn Gess OTR/L,CLT  08/27/2016, 8:21 PM  [place] [facility] [address] [place], [address] Phone: [phone]   Fax:  [phone]  Name: Margaret Townsend MRN: [id number] Date of Birth: 1946/04/08

## 2016-08-29 ENCOUNTER — Ambulatory Visit: Payer: Commercial Managed Care - HMO | Admitting: Occupational Therapy

## 2016-08-29 DIAGNOSIS — M25522 Pain in left elbow: Secondary | ICD-10-CM | POA: Diagnosis not present

## 2016-08-29 DIAGNOSIS — M25622 Stiffness of left elbow, not elsewhere classified: Secondary | ICD-10-CM

## 2016-08-29 NOTE — Therapy (Signed)
[place] [facility] [address], [place], [address] Phone: [phone]   Fax:  [phone]  Occupational Therapy Treatment  Patient Details  Name: Margaret Townsend MRN: [id number] Date of Birth: 01/01/1946 Referring Provider: Collier Bullock  Encounter Date: 08/29/2016      OT End of Session - 08/29/16 0819    Visit Number 3   Number of Visits 16   Date for OT Re-Evaluation 10/18/16   OT Start Time 0805   OT Stop Time 0855   OT Time Calculation (min) 50 min   Activity Tolerance Patient tolerated treatment well   Behavior During Therapy [facility] for tasks assessed/performed      Past Medical History:  Diagnosis Date  . Cancer (HCC)    breast  . Hypertension   . Thyroid disease     Past Surgical History:  Procedure Laterality Date  . ABDOMINAL HYSTERECTOMY    . BREAST SURGERY    . MASTECTOMY      There were no vitals filed for this visit.      Subjective Assessment - 08/29/16 0818    Subjective  Did okay - could comb my hair a little on the L side yesterday - feel mostly pull on the out side of the elbow    Patient Stated Goals Want to get the motion and use of L hand and arm back - to cut hair, put contact lenses, sewing , yard work , dressing ,    Currently in Pain? No/denies                      OT Treatments/Exercises (OP) - 08/29/16 0001      Moist Heat Therapy   Number Minutes Moist Heat 10 Minutes   Moist Heat Location Elbow      AROM measured flexion and extention 135, and extention -40 in standing  In SUPINE  heat done over elbow in extention with roll towel under hand for support Soft tissue mobs over bicep and ulnar /radial side of upper arm - distal more than proximal  - tender and some trigger points distally   Gentle AROM for elbow flexion and extention - neutral position - slight pull - worked 90 degrees to comfortable end range for both 10 reps  In supine  AAROM  for shoulder flexion reaching over head in supine - and down to L hip  Reaching for back of head -  And up to ceiling  AROM in standing against wall  Elbow flexion and extention with upper arm against wall  Scapula retraction 10 reps  Cervical rotation billateral  Lat cervical flexion R  And scalenes stretch to R  Add to HEP              OT Education - 08/29/16 0819    Education provided Yes   Education Details HEP update adn changes   Person(s) Educated Patient   Methods Explanation;Demonstration;Tactile cues;Verbal cues   Comprehension Verbal cues required;Returned demonstration;Verbalized understanding          OT Short Term Goals - 08/23/16 1112      OT SHORT TERM GOAL #1   Title Pt to be ind in HEP to increase AROM in L elbow flexion and extention to put hand in pocket and put contact lenses in    Baseline flexion 120 , ext -70   Time 4   Period Weeks   Status New  OT Long Term Goals - 08/23/16 1114      OT LONG TERM GOAL #1   Title Upgrade goals to increase strength  in L Elbow to increase functional use of L arm   Baseline only gentle PROM and AROM to elbow   Time 4   Period Weeks   Status New     OT LONG TERM GOAL #2   Title Assess wrist AROM when ordered by MD    Baseline Wrist in splint - lunate fx   Time 4   Period Weeks   Status New               Plan - 08/29/16 [id number]    Clinical Impression Statement Pt cont to show increase ROM in L elbow flexion and extention - able to reach better in supine involving shoulder and elbow - over head , and used hand to fix hair lightly - still pain end range , edema - upper traps better this date with doing stretches at home    Rehab Potential Good   OT Frequency 2x / week   OT Treatment/Interventions Self-care/ADL training;Moist Heat;Fluidtherapy;Patient/family education;Splinting;Therapeutic exercises;Contrast Bath;Manual Therapy;Passive range of motion   Plan assess ROM gains -  reaching in standing /shoulder ??   OT Home Exercise Plan see pt instruction    Consulted and Agree with Plan of Care Patient      Patient will benefit from skilled therapeutic intervention in order to improve the following deficits and impairments:  Decreased range of motion, Impaired flexibility, Increased edema, Impaired UE functional use, Pain, Decreased strength  Visit Diagnosis: Pain in left elbow  Stiffness of left elbow, not elsewhere classified    Problem List Patient Active Problem List   Diagnosis Date Noted  . Allergic rhinitis, seasonal 11/13/2015  . Avitaminosis D 11/13/2015  . Essential (primary) hypertension 08/28/2015  . Gastro-esophageal reflux disease without esophagitis 08/28/2015  . Combined fat and carbohydrate induced hyperlipemia 08/28/2015  . Malignant neoplasm of breast ([place]) 07/13/2014  . HLD (hyperlipidemia) 05/09/2014  . Inflamed nasal mucosa 05/09/2014  . Acid reflux 04/06/2014  . BP (high blood pressure) 04/06/2014  . Adult hypothyroidism 04/06/2014  . Degenerative arthritis of lumbar spine 04/06/2014    Rosalyn Gess OTR/L,CLT 08/29/2016, 1:59 PM  [place] [facility] [address], [place], [address] Phone: [phone]   Fax:  [phone]  Name: Margaret Townsend MRN: [id number] Date of Birth: [birth year]

## 2016-08-29 NOTE — Patient Instructions (Addendum)
Heat at home  HEP  Gentle AROM for elbow flexion and extention - neutral position - slight pull - AROM for shoulder flexion reaching over head in supine - and down to L hip  Reaching for back of head -  And up to ceiling  AROM in standing against wall  Elbow flexion and extention with upper arm against wall  Scapula retraction 10 reps  Cervical rotation billateral  Lat cervical flexion R  And scalenes stretch to R

## 2016-09-03 ENCOUNTER — Ambulatory Visit: Payer: Commercial Managed Care - HMO | Attending: Family | Admitting: Occupational Therapy

## 2016-09-03 DIAGNOSIS — M25532 Pain in left wrist: Secondary | ICD-10-CM | POA: Insufficient documentation

## 2016-09-03 DIAGNOSIS — M25522 Pain in left elbow: Secondary | ICD-10-CM | POA: Diagnosis not present

## 2016-09-03 DIAGNOSIS — M25622 Stiffness of left elbow, not elsewhere classified: Secondary | ICD-10-CM | POA: Insufficient documentation

## 2016-09-03 DIAGNOSIS — M25632 Stiffness of left wrist, not elsewhere classified: Secondary | ICD-10-CM | POA: Diagnosis not present

## 2016-09-03 NOTE — Therapy (Signed)
[place] [facility] [address] [place], [address] Phone: [phone]   Fax:  [phone]  Occupational Therapy Treatment  Patient Details  Name: Margaret Townsend MRN: [id number] Date of Birth: 12/31/1945 Referring Provider: Collier Bullock  Encounter Date: 09/03/2016      OT End of Session - 09/03/16 1504    Visit Number 4   Number of Visits 16   Date for OT Re-Evaluation 10/18/16   OT Start Time 1216   OT Stop Time 1305   OT Time Calculation (min) 49 min   Activity Tolerance Patient tolerated treatment well   Behavior During Therapy [facility] for tasks assessed/performed      Past Medical History:  Diagnosis Date  . Cancer (HCC)    breast  . Hypertension   . Thyroid disease     Past Surgical History:  Procedure Laterality Date  . ABDOMINAL HYSTERECTOMY    . BREAST SURGERY    . MASTECTOMY      There were no vitals filed for this visit.      Subjective Assessment - 09/03/16 1502    Subjective  Doing okay - exercises - and yes  I am wearing contact today - was able to get them in - see MD next Thursday - my fingers and hand bother me in am - really stiff    Patient Stated Goals Want to get the motion and use of L hand and arm back - to cut hair, put contact lenses, sewing , yard work , dressing ,    Currently in Pain? Yes   Pain Score 1    Pain Location Elbow   Pain Orientation Left   Pain Descriptors / Indicators Aching;Sore   Pain Type Acute pain   Pain Onset More than a month ago            [facility] OT Assessment - 09/03/16 0001      AROM   Left Elbow Flexion 135   Left Elbow Extension -28                  OT Treatments/Exercises (OP) - 09/03/16 0001      Moist Heat Therapy   Number Minutes Moist Heat 10 Minutes   Moist Heat Location Elbow      AROM measured flexion and extention 135, and extention -28 in standing  In SUPINE  heat done over elbow in extention with cervical  on upper arm and one over prox forearm Soft tissue mobs over bicep, tricep and ulnar /radial side of upper arm - distal more than proximal  - tender and some trigger points distally   Gentle AROM for elbow flexion and extention - neutral position - slight pull-3 digits from mat - but compensate with shoulder  10 reps  In supine  AROM for shoulder flexion reaching over head in supine - and down to L hip/mat Reaching for back of head -  And up to ceiling  AROM in standing against wall  Elbow flexion and extention with upper arm against wall, and reaching behind head and down to hip   Scapula retraction 10 reps  Cervical rotation billateral  Lat cervical flexion R  And scalenes stretch to R              OT Education - 09/03/16 1503    Education provided Yes   Education Details HEP review   Person(s) Educated Patient   Methods Explanation;Demonstration;Tactile cues;Verbal cues  Comprehension Verbalized understanding;Returned demonstration;Verbal cues required          OT Short Term Goals - 08/23/16 1112      OT SHORT TERM GOAL #1   Title Pt to be ind in HEP to increase AROM in L elbow flexion and extention to put hand in pocket and put contact lenses in    Baseline flexion 120 , ext -70   Time 4   Period Weeks   Status New           OT Long Term Goals - 08/23/16 1114      OT LONG TERM GOAL #1   Title Upgrade goals to increase strength  in L Elbow to increase functional use of L arm   Baseline only gentle PROM and AROM to elbow   Time 4   Period Weeks   Status New     OT LONG TERM GOAL #2   Title Assess wrist AROM when ordered by MD    Baseline Wrist in splint - lunate fx   Time 4   Period Weeks   Status New               Plan - 09/03/16 1504    Clinical Impression Statement Pt cont to make progress in AROM in L elbow - still pain in extention more than flexion - and not adressing any wrist ROM or supination until appt with MD - pt able to use  lightly - elbow and wrist splint still  on    Rehab Potential Good   OT Frequency 2x / week   OT Duration 8 weeks   OT Treatment/Interventions Self-care/ADL training;Moist Heat;Fluidtherapy;Patient/family education;Splinting;Therapeutic exercises;Contrast Bath;Manual Therapy;Passive range of motion   Plan cont to progress AROM    OT Home Exercise Plan see pt instruction    Consulted and Agree with Plan of Care Patient      Patient will benefit from skilled therapeutic intervention in order to improve the following deficits and impairments:  Decreased range of motion, Impaired flexibility, Increased edema, Impaired UE functional use, Pain, Decreased strength  Visit Diagnosis: Pain in left elbow  Stiffness of left elbow, not elsewhere classified    Problem List Patient Active Problem List   Diagnosis Date Noted  . Allergic rhinitis, seasonal 11/13/2015  . Avitaminosis D 11/13/2015  . Essential (primary) hypertension 08/28/2015  . Gastro-esophageal reflux disease without esophagitis 08/28/2015  . Combined fat and carbohydrate induced hyperlipemia 08/28/2015  . Malignant neoplasm of breast ([place]) 07/13/2014  . HLD (hyperlipidemia) 05/09/2014  . Inflamed nasal mucosa 05/09/2014  . Acid reflux 04/06/2014  . BP (high blood pressure) 04/06/2014  . Adult hypothyroidism 04/06/2014  . Degenerative arthritis of lumbar spine 04/06/2014    Rosalyn Gess OTR/L,CLT 09/03/2016, 3:09 PM  [place] [facility] [address], [place], [address] Phone: [phone]   Fax:  [phone]  Name: Margaret Townsend MRN: [id number] Date of Birth: 1946-09-18

## 2016-09-03 NOTE — Patient Instructions (Signed)
Same HEP - great progress

## 2016-09-04 ENCOUNTER — Encounter: Payer: Commercial Managed Care - HMO | Admitting: Occupational Therapy

## 2016-09-06 ENCOUNTER — Ambulatory Visit: Payer: Commercial Managed Care - HMO | Admitting: Occupational Therapy

## 2016-09-06 DIAGNOSIS — M25522 Pain in left elbow: Secondary | ICD-10-CM | POA: Diagnosis not present

## 2016-09-06 DIAGNOSIS — M25622 Stiffness of left elbow, not elsewhere classified: Secondary | ICD-10-CM | POA: Diagnosis not present

## 2016-09-06 DIAGNOSIS — M25632 Stiffness of left wrist, not elsewhere classified: Secondary | ICD-10-CM | POA: Diagnosis not present

## 2016-09-06 DIAGNOSIS — M25532 Pain in left wrist: Secondary | ICD-10-CM | POA: Diagnosis not present

## 2016-09-06 NOTE — Patient Instructions (Addendum)
Gentle AROM for elbow flexion and extention - neutral position -In supine  AROM for shoulder flexion reaching over head in supine - and down to L hip/mat Reaching across to R shoulder and out to L side - 10 reps  AROM in standing against wall Elbow flexion and extention with upper arm against wall, and reaching behind head and down to hip  Add D1 patterns - acoss body reaching up on L and down to R hip  And R shoulder to L hip  10 reps  Felt pull at elbow -   Scapula retraction 10 reps  Add shoulder flexion - scaption at 45 degrees angle - to 90 against wall

## 2016-09-06 NOTE — Therapy (Signed)
[place] [facility] [address], [place], [address] Phone: [phone]   Fax:  [phone]  Occupational Therapy Treatment  Patient Details  Name: Margaret Townsend MRN: [id number] Date of Birth: 07-22-1946 Referring Provider: Collier Bullock  Encounter Date: 09/06/2016      OT End of Session - 09/06/16 0821    Visit Number 5   Number of Visits 16   Date for OT Re-Evaluation 10/18/16   OT Start Time 0810   OT Stop Time 0849   OT Time Calculation (min) 39 min   Activity Tolerance Patient tolerated treatment well   Behavior During Therapy [facility] for tasks assessed/performed      Past Medical History:  Diagnosis Date  . Cancer (HCC)    breast  . Hypertension   . Thyroid disease     Past Surgical History:  Procedure Laterality Date  . ABDOMINAL HYSTERECTOMY    . BREAST SURGERY    . MASTECTOMY      There were no vitals filed for this visit.      Subjective Assessment - 09/06/16 0812    Subjective  Doing okay - using my hand more in dressing and bathing -pain more in fingers and wrist    Patient Stated Goals Want to get the motion and use of L hand and arm back - to cut hair, put contact lenses, sewing , yard work , dressing ,    Currently in Pain? Yes   Pain Score 1    Pain Location Finger (Comment which one)   Pain Orientation Left   Pain Descriptors / Indicators Aching                      OT Treatments/Exercises (OP) - 09/06/16 0001      Moist Heat Therapy   Number Minutes Moist Heat 10 Minutes   Moist Heat Location Elbow      AROM measured flexion and extention 140, and extention -24 in standing  In SUPINE  heat done over elbow in extention with cervical on upper arm and one over prox forearm Soft tissue mobs over bicep, tricep and ulnar /radial side of upper arm - distal more than proximal - tender and some trigger points distally  Used some Graston tools nr 2 over bicep and  tricep prior to ROM - to decrease spasm and trigger points - and increase ROM   Gentle AROM for elbow flexion and extention - neutral position - slight pull-touching mat  This date with 5th  10 reps  In supine  AROM for shoulder flexion reaching over head in supine - and down to L hip/mat Reaching across to R shoulder and out to L side - 10 reps  AROM in standing against wall Elbow flexion and extention with upper arm against wall, and reaching behind head and down to hip  Add D1 patterns - acoss body reaching up on L and down to R hip  And R shoulder to L hip  10 reps  Felt pull at elbow -   Scapula retraction 10 reps  Add shoulder flexion - scaption at 45 degrees angle - to 90 against wall              OT Education - 09/06/16 0821    Education provided Yes   Education Details HEP review   Person(s) Educated Patient   Methods Explanation;Demonstration;Tactile cues   Comprehension Verbalized understanding;Returned demonstration;Verbal cues required  OT Short Term Goals - 09/06/16 0937      OT SHORT TERM GOAL #1   Title Pt to be ind in HEP to increase AROM in L elbow flexion and extention to put hand in pocket and put contact lenses in    Baseline flexion 140; extention -24    Status Achieved           OT Long Term Goals - 09/06/16 [id number]      OT LONG TERM GOAL #1   Title Upgrade goals to increase strength  in L Elbow to increase functional use of L arm   Baseline only gentle PROM and AROM to elbow   Time 4   Period Weeks   Status On-going     OT LONG TERM GOAL #2   Title Assess wrist AROM when ordered by MD    Baseline Wrist in splint - lunate fx   Time 4   Period Weeks   Status On-going               Plan - 09/06/16 [id number]    Clinical Impression Statement Pt cont to make great progress in AROM at L elbow - as well as shoulder - increase functional use in ADL's - wrist still in splint - add some shoulder and elbow against gravity this  date - pain and edema decreasing    Rehab Potential Good   OT Frequency 2x / week   OT Duration 6 weeks   OT Treatment/Interventions Self-care/ADL training;Moist Heat;Fluidtherapy;Patient/family education;Splinting;Therapeutic exercises;Contrast Bath;Manual Therapy;Passive range of motion   Plan cont to assess progress and upgrade as needed    OT Home Exercise Plan see pt instruction    Consulted and Agree with Plan of Care Patient      Patient will benefit from skilled therapeutic intervention in order to improve the following deficits and impairments:  Decreased range of motion, Impaired flexibility, Increased edema, Impaired UE functional use, Pain, Decreased strength  Visit Diagnosis: Pain in left elbow  Stiffness of left elbow, not elsewhere classified    Problem List Patient Active Problem List   Diagnosis Date Noted  . Allergic rhinitis, seasonal 11/13/2015  . Avitaminosis D 11/13/2015  . Essential (primary) hypertension 08/28/2015  . Gastro-esophageal reflux disease without esophagitis 08/28/2015  . Combined fat and carbohydrate induced hyperlipemia 08/28/2015  . Malignant neoplasm of breast ([place]) 07/13/2014  . HLD (hyperlipidemia) 05/09/2014  . Inflamed nasal mucosa 05/09/2014  . Acid reflux 04/06/2014  . BP (high blood pressure) 04/06/2014  . Adult hypothyroidism 04/06/2014  . Degenerative arthritis of lumbar spine 04/06/2014    Rosalyn Gess OTR/L,CLT 09/06/2016, 9:44 AM  [place] [facility] [address], [place], [address] Phone: [phone]   Fax:  [phone]  Name: HALIMATOU BORING MRN: [id number] Date of Birth: 1946-08-13

## 2016-09-09 ENCOUNTER — Encounter: Payer: Self-pay | Admitting: Emergency Medicine

## 2016-09-09 ENCOUNTER — Emergency Department
Admission: EM | Admit: 2016-09-09 | Discharge: 2016-09-09 | Disposition: A | Discharge status: Home / Self Care | Payer: Commercial Managed Care - HMO | Attending: Emergency Medicine | Admitting: Emergency Medicine

## 2016-09-09 ENCOUNTER — Emergency Department: Payer: Commercial Managed Care - HMO

## 2016-09-09 ENCOUNTER — Other Ambulatory Visit: Payer: Self-pay

## 2016-09-09 DIAGNOSIS — Z7951 Long term (current) use of inhaled steroids: Secondary | ICD-10-CM | POA: Insufficient documentation

## 2016-09-09 DIAGNOSIS — F0781 Postconcussional syndrome: Secondary | ICD-10-CM | POA: Insufficient documentation

## 2016-09-09 DIAGNOSIS — R519 Headache, unspecified: Secondary | ICD-10-CM

## 2016-09-09 DIAGNOSIS — I10 Essential (primary) hypertension: Secondary | ICD-10-CM | POA: Diagnosis not present

## 2016-09-09 DIAGNOSIS — Z853 Personal history of malignant neoplasm of breast: Secondary | ICD-10-CM | POA: Insufficient documentation

## 2016-09-09 DIAGNOSIS — E039 Hypothyroidism, unspecified: Secondary | ICD-10-CM | POA: Diagnosis not present

## 2016-09-09 DIAGNOSIS — Z791 Long term (current) use of non-steroidal anti-inflammatories (NSAID): Secondary | ICD-10-CM | POA: Diagnosis not present

## 2016-09-09 DIAGNOSIS — Z79899 Other long term (current) drug therapy: Secondary | ICD-10-CM | POA: Diagnosis not present

## 2016-09-09 DIAGNOSIS — R42 Dizziness and giddiness: Secondary | ICD-10-CM

## 2016-09-09 DIAGNOSIS — R51 Headache: Secondary | ICD-10-CM | POA: Insufficient documentation

## 2016-09-09 LAB — CBC
HEMATOCRIT: 40.6 % (ref 35.0–47.0)
Hemoglobin: 14.2 g/dL (ref 12.0–16.0)
MCH: 30.5 pg (ref 26.0–34.0)
MCHC: 35 g/dL (ref 32.0–36.0)
MCV: 87.2 fL (ref 80.0–100.0)
PLATELETS: 226 10*3/uL (ref 150–440)
RBC: 4.66 MIL/uL (ref 3.80–5.20)
RDW: 14.3 % (ref 11.5–14.5)
WBC: 9 10*3/uL (ref 3.6–11.0)

## 2016-09-09 LAB — BASIC METABOLIC PANEL
Anion gap: 10 (ref 5–15)
BUN: 19 mg/dL (ref 6–20)
CHLORIDE: 104 mmol/L (ref 101–111)
CO2: 23 mmol/L (ref 22–32)
CREATININE: 0.77 mg/dL (ref 0.44–1.00)
Calcium: 9.5 mg/dL (ref 8.9–10.3)
GFR calc Af Amer: >60 mL/min (ref >60)
GLUCOSE: 107 mg/dL — AB (ref 65–99)
POTASSIUM: 3.8 mmol/L (ref 3.5–5.1)
Sodium: 137 mmol/L (ref 135–145)

## 2016-09-09 MED ORDER — ACETAMINOPHEN 500 MG PO TABS
1000.0000 mg | ORAL_TABLET | Freq: Once | ORAL | Status: AC
  Administered 2016-09-09: 1000 mg via ORAL
  Filled 2016-09-09: qty 2

## 2016-09-09 MED ORDER — TRAMADOL HCL 50 MG PO TABS
50.0000 mg | ORAL_TABLET | Freq: Once | ORAL | Status: AC
  Administered 2016-09-09: 50 mg via ORAL
  Filled 2016-09-09: qty 1

## 2016-09-09 MED ORDER — BUTALBITAL-APAP-CAFFEINE 50-325-40 MG PO TABS: 1.0000 | ORAL_TABLET | Freq: Four times a day (QID) | ORAL | 0 refills | Status: AC | PRN

## 2016-09-09 NOTE — ED Triage Notes (Signed)
Pt presents with c/o headache and dizziness and some blurry vision for five weeks. Pt fell about five weeks ago injuring her left shoulder and arm and states she did hit her head at time of fall but was seen at [facility] for fall and they did CT scan of head and was negative. Pt states she did not take her BP meds today.

## 2016-09-09 NOTE — ED Notes (Signed)
Pt will go to MRI to have scan completed and will be escorted by ED Tech who will stay with pt during the entire time of MRI.

## 2016-09-09 NOTE — ED Notes (Signed)
Pt daughter request to see edp about discharge and pt cannot take the fioricet and can only take tramadol for pain. edp aware of pt request.

## 2016-09-09 NOTE — ED Provider Notes (Addendum)
[facility] Emergency Department Provider Note        Time seen: ----------------------------------------- 5:17 PM on 09/09/2016 -----------------------------------------    I have reviewed the triage vital signs and the nursing notes.   HISTORY  Chief Complaint Headache and Hypertension    HPI Margaret Townsend is a 70 y.o. female who presents to ER for headache and dizziness with blurry vision for the last 5 weeks. Patient states 5 weeks ago she fell injuring her left shoulder and left arm and states she did hit her head at that time was seen at [facility]. She did have loss of consciousness at that time and had a normal CT scan. She was also concerned today because her blood pressure was elevated but she has not taken her blood pressure medicine. Patient reports persistent headache, dizziness, vision trouble and generally not feeling well since her head injury.   Past Medical History:  Diagnosis Date  . Cancer (HCC)    breast  . Hypertension   . Thyroid disease     Patient Active Problem List   Diagnosis Date Noted  . Allergic rhinitis, seasonal 11/13/2015  . Avitaminosis D 11/13/2015  . Essential (primary) hypertension 08/28/2015  . Gastro-esophageal reflux disease without esophagitis 08/28/2015  . Combined fat and carbohydrate induced hyperlipemia 08/28/2015  . Malignant neoplasm of breast ([place]) 07/13/2014  . HLD (hyperlipidemia) 05/09/2014  . Inflamed nasal mucosa 05/09/2014  . Acid reflux 04/06/2014  . BP (high blood pressure) 04/06/2014  . Adult hypothyroidism 04/06/2014  . Degenerative arthritis of lumbar spine 04/06/2014    Past Surgical History:  Procedure Laterality Date  . ABDOMINAL HYSTERECTOMY    . BREAST SURGERY    . MASTECTOMY      Allergies Codeine; Oxycodone-acetaminophen; Propoxyphene; Albuterol; Demerol [meperidine]; Dilaudid [hydromorphone hcl]; Morphine and related; Sulfa antibiotics; Amoxicillin-pot clavulanate;  Hydrocodone-acetaminophen; Iodinated diagnostic agents; Levofloxacin; Promethazine; and Tramadol  Social History Social History  Substance Use Topics  . Smoking status: Never Smoker  . Smokeless tobacco: Never Used  . Alcohol use No    Review of Systems Constitutional: Negative for fever. ENT: Positive for vision changes Cardiovascular: Negative for chest pain. Respiratory: Negative for shortness of breath. Gastrointestinal: Negative for abdominal pain, vomiting and diarrhea. Genitourinary: Negative for dysuria. Musculoskeletal: Negative for back pain. Skin: Negative for rash. Neurological: Positive for headache, dizziness, weakness  10-point ROS otherwise negative.  ____________________________________________   PHYSICAL EXAM:  VITAL SIGNS: ED Triage Vitals  Enc Vitals Group     BP 09/09/16 1258 (!) 167/102     Pulse Rate 09/09/16 1258 80     Resp 09/09/16 1258 18     Temp 09/09/16 1258 98.6 F (37 C)     Temp Source 09/09/16 1258 Oral     SpO2 09/09/16 1258 98 %     Weight 09/09/16 1259 172 lb (78 kg)     Height 09/09/16 1259 5\' 5"  (1.651 m)     Head Circumference --      Peak Flow --      Pain Score 09/09/16 1259 10     Pain Loc --      Pain Edu? --      Excl. in University? --     Constitutional: Alert and oriented. Well appearing and in no distress. Eyes: Conjunctivae are normal. PERRL. Normal extraocular movements. ENT   Head: Normocephalic and atraumatic.   Nose: No congestion/rhinnorhea.   Mouth/Throat: Mucous membranes are moist.   Neck: No stridor. Cardiovascular: Normal rate, regular  rhythm. No murmurs, rubs, or gallops. Respiratory: Normal respiratory effort without tachypnea nor retractions. Breath sounds are clear and equal bilaterally. No wheezes/rales/rhonchi. Gastrointestinal: Soft and nontender. Normal bowel sounds Musculoskeletal: Nontender with normal range of motion in all extremities. No lower extremity tenderness nor  edema. Neurologic:  Normal speech and language. No gross focal neurologic deficits are appreciated. Strength, sensation, cranial nerves appear to be intact. Skin:  Skin is warm, dry and intact. No rash noted. Psychiatric: Mood and affect are normal. Speech and behavior are normal.  ____________________________________________  EKG: Interpreted by me. Normal sinus rhythm rate 85 bpm, normal PR interval, normal QRS, normal QT interval. Normal axis.  ____________________________________________  ED COURSE:  Pertinent labs & imaging results that were available during my care of the patient were reviewed by me and considered in my medical decision making (see chart for details). Clinical Course  Patient is in no distress, likely with postconcussive symptoms. We will assess with basic labs, consider imaging.  Procedures ____________________________________________   LABS (pertinent positives/negatives)  Labs Reviewed  BASIC METABOLIC PANEL - Abnormal; Notable for the following:       Result Value   Glucose, Bld 107 (*)    All other components within normal limits  CBC  URINALYSIS COMPLETEWITH MICROSCOPIC ([facility] ONLY)  MRI brain IMPRESSION: No acute infarct or intracranial hemorrhage.  Mild chronic microvascular changes.  Mild global atrophy.  Minimal mucosal thickening ethmoid sinus air cells and maxillary sinuses.  ____________________________________________  FINAL ASSESSMENT AND PLAN  Dizziness, postconcussive syndrome  Plan: Patient with labs as dictated above. Patient is in no acute distress, MRI is reassuring. I will advise that she restart her blood pressure medication which she has been noncompliant with. Likely this is postconcussive in etiology, I will prescribe headache medicine and encouraged her to have close outpatient with follow-up with her doctor and referral to neurology.   Earleen Newport, MD   Note: This dictation was prepared with Dragon  dictation. Any transcriptional errors that result from this process are unintentional    Earleen Newport, MD 09/09/16 1720    Earleen Newport, MD 09/09/16 2001

## 2016-09-09 NOTE — ED Notes (Signed)
MRI tech notified of order.

## 2016-09-10 ENCOUNTER — Ambulatory Visit: Payer: Commercial Managed Care - HMO | Admitting: Occupational Therapy

## 2016-09-12 DIAGNOSIS — S53105D Unspecified dislocation of left ulnohumeral joint, subsequent encounter: Secondary | ICD-10-CM | POA: Diagnosis not present

## 2016-09-12 DIAGNOSIS — M25532 Pain in left wrist: Secondary | ICD-10-CM | POA: Diagnosis not present

## 2016-09-12 DIAGNOSIS — S63502D Unspecified sprain of left wrist, subsequent encounter: Secondary | ICD-10-CM | POA: Diagnosis not present

## 2016-09-12 DIAGNOSIS — M7989 Other specified soft tissue disorders: Secondary | ICD-10-CM | POA: Diagnosis not present

## 2016-09-13 ENCOUNTER — Ambulatory Visit: Payer: Commercial Managed Care - HMO | Admitting: Occupational Therapy

## 2016-09-13 DIAGNOSIS — M25632 Stiffness of left wrist, not elsewhere classified: Secondary | ICD-10-CM | POA: Diagnosis not present

## 2016-09-13 DIAGNOSIS — M25532 Pain in left wrist: Secondary | ICD-10-CM | POA: Diagnosis not present

## 2016-09-13 DIAGNOSIS — M25622 Stiffness of left elbow, not elsewhere classified: Secondary | ICD-10-CM

## 2016-09-13 DIAGNOSIS — M25522 Pain in left elbow: Secondary | ICD-10-CM | POA: Diagnosis not present

## 2016-09-13 DIAGNOSIS — I10 Essential (primary) hypertension: Secondary | ICD-10-CM | POA: Diagnosis not present

## 2016-09-13 DIAGNOSIS — R42 Dizziness and giddiness: Secondary | ICD-10-CM | POA: Diagnosis not present

## 2016-09-13 NOTE — Patient Instructions (Addendum)
Add to HEP for AROM for wrist flexion , ext, RD and UD - on lap without gravity  AROM for sup/pro  Tendon glides  10 reps each hold 3 sec   Cont with elbow AROM - rotate forearm in 3 position during flexion and extention of elbow  Pt to cont with shoulder and  D1 and D2  Patterns AROM  cervical AROM

## 2016-09-13 NOTE — Therapy (Signed)
[place] [facility] [address] [place], [address] Phone: [phone]   Fax:  [phone]  Occupational Therapy Treatment  Patient Details  Name: Margaret Townsend MRN: [id number] Date of Birth: 1946-02-20 Referring Provider: Collier Bullock  Encounter Date: 09/13/2016      OT End of Session - 09/13/16 1416    Visit Number 6   Number of Visits 16   Date for OT Re-Evaluation 10/18/16   OT Start Time 1120   OT Stop Time 1201   OT Time Calculation (min) 41 min   Activity Tolerance Patient tolerated treatment well   Behavior During Therapy [facility] for tasks assessed/performed      Past Medical History:  Diagnosis Date  . Cancer (HCC)    breast  . Hypertension   . Thyroid disease     Past Surgical History:  Procedure Laterality Date  . ABDOMINAL HYSTERECTOMY    . BREAST SURGERY    . MASTECTOMY      There were no vitals filed for this visit.      Subjective Assessment - 09/13/16 1405    Subjective   Seen DR and can take off elbow and wrist splint when home - wear when inbetween people- was in ER with hight BP, dizziness and H/a - said it is post concussion - the elbw MD send this paper for you    Patient Stated Goals Want to get the motion and use of L hand and arm back - to cut hair, put contact lenses, sewing , yard work , dressing ,    Currently in Pain? Yes   Pain Score 2    Pain Location Wrist   Pain Orientation Left   Pain Descriptors / Indicators Aching   Pain Type Acute pain                      OT Treatments/Exercises (OP) - 09/13/16 0001      LUE Fluidotherapy   Number Minutes Fluidotherapy 10 Minutes   LUE Fluidotherapy Location Hand;Wrist   Comments AROM at [facility] to wrist and digits to increase ROM and decrease pain - heatingpad sournd elbow       Assess AROM for wrist in all planes fluido for wrist and digits heatingpad around elbow  AROM for wrist flexion , ext, RD and  UD - on lap without gravity  AROM for sup/pro  Tendon glides  10 reps each hold 3 sec   Gentle PROM for elbow flexion and extention - palm up , down and neutral 10 reps each  Add and change for HEP  Pt to cont with shoulder and  D1 and D2  Patterns AROM  cervical AROM             OT Education - 09/13/16 1416    Education provided Yes   Education Details HEP update   Person(s) Educated Patient   Methods Explanation;Demonstration;Tactile cues;Verbal cues;Handout   Comprehension Verbal cues required;Returned demonstration;Verbalized understanding          OT Short Term Goals - 09/06/16 0937      OT SHORT TERM GOAL #1   Title Pt to be ind in HEP to increase AROM in L elbow flexion and extention to put hand in pocket and put contact lenses in    Baseline flexion 140; extention -24    Status Achieved           OT Long Term Goals -  09/06/16 0938      OT LONG TERM GOAL #1   Title Upgrade goals to increase strength  in L Elbow to increase functional use of L arm   Baseline only gentle PROM and AROM to elbow   Time 4   Period Weeks   Status On-going     OT LONG TERM GOAL #2   Title Assess wrist AROM when ordered by MD    Baseline Wrist in splint - lunate fx   Time 4   Period Weeks   Status On-going               Plan - 09/13/16 1417    Clinical Impression Statement Pt making great progress in sup/pro , elbow flexion and extention - did get new order from MD for wrist and elbow PROM , stretching and strengthengin - pt now 5 wks  from elbow dislocation  - pt to wean out of wrist splint  into benik neoprened for wrist and elbow hinge splint only when inbetween  people   Rehab Potential Good   OT Frequency 2x / week   OT Duration 6 weeks   OT Treatment/Interventions Self-care/ADL training;Moist Heat;Fluidtherapy;Patient/family education;Splinting;Therapeutic exercises;Contrast Bath;Manual Therapy;Passive range of motion   Plan assess progress at elbow and  shoulder - upgrade HEP   OT Home Exercise Plan see pt instruction    Consulted and Agree with Plan of Care Patient      Patient will benefit from skilled therapeutic intervention in order to improve the following deficits and impairments:  Decreased range of motion, Impaired flexibility, Increased edema, Impaired UE functional use, Pain, Decreased strength  Visit Diagnosis: Pain in left elbow  Stiffness of left elbow, not elsewhere classified  Stiffness of left wrist, not elsewhere classified  Pain in left wrist    Problem List Patient Active Problem List   Diagnosis Date Noted  . Allergic rhinitis, seasonal 11/13/2015  . Avitaminosis D 11/13/2015  . Essential (primary) hypertension 08/28/2015  . Gastro-esophageal reflux disease without esophagitis 08/28/2015  . Combined fat and carbohydrate induced hyperlipemia 08/28/2015  . Malignant neoplasm of breast ([place]) 07/13/2014  . HLD (hyperlipidemia) 05/09/2014  . Inflamed nasal mucosa 05/09/2014  . Acid reflux 04/06/2014  . BP (high blood pressure) 04/06/2014  . Adult hypothyroidism 04/06/2014  . Degenerative arthritis of lumbar spine 04/06/2014    Rosalyn Gess OTR/L,CLT 09/13/2016, 2:21 PM  [place] [facility] [address] [place], [address] Phone: [phone]   Fax:  [phone]  Name: Margaret Townsend MRN: [id number] Date of Birth: 06/20/1946

## 2016-09-18 ENCOUNTER — Ambulatory Visit: Payer: Commercial Managed Care - HMO | Admitting: Occupational Therapy

## 2016-09-18 DIAGNOSIS — M25522 Pain in left elbow: Secondary | ICD-10-CM

## 2016-09-18 DIAGNOSIS — M25622 Stiffness of left elbow, not elsewhere classified: Secondary | ICD-10-CM | POA: Diagnosis not present

## 2016-09-18 DIAGNOSIS — M25532 Pain in left wrist: Secondary | ICD-10-CM | POA: Diagnosis not present

## 2016-09-18 DIAGNOSIS — M25632 Stiffness of left wrist, not elsewhere classified: Secondary | ICD-10-CM

## 2016-09-18 NOTE — Therapy (Signed)
[place] [facility] [address] [place], [address] Phone: [phone]   Fax:  [phone]  Occupational Therapy Treatment  Patient Details  Name: Margaret Townsend MRN: [id number] Date of Birth: Apr 25, 1946 Referring Provider: Collier Bullock  Encounter Date: 09/18/2016      OT End of Session - 09/18/16 1743    Visit Number 7   Number of Visits 16   Date for OT Re-Evaluation 10/18/16   OT Start Time 43   OT Stop Time 1818   OT Time Calculation (min) 50 min   Activity Tolerance Patient tolerated treatment well   Behavior During Therapy [facility] for tasks assessed/performed      Past Medical History:  Diagnosis Date  . Cancer (HCC)    breast  . Hypertension   . Thyroid disease     Past Surgical History:  Procedure Laterality Date  . ABDOMINAL HYSTERECTOMY    . BREAST SURGERY    . MASTECTOMY      There were no vitals filed for this visit.      Subjective Assessment - 09/18/16 1739    Subjective  Still having the dizziness, high BP, and neck in the back hurts at times nad go numb feels - did see my MD and see her again friday - they did MRI in ER - and blood work looked normal - my wrist and elbow is doing better -    Patient Stated Goals Want to get the motion and use of L hand and arm back - to cut hair, put contact lenses, sewing , yard work , dressing ,    Currently in Pain? Yes   Pain Score 1    Pain Location Wrist   Pain Orientation Left   Pain Descriptors / Indicators Aching                      OT Treatments/Exercises (OP) - 09/18/16 0001      LUE Fluidotherapy   Number Minutes Fluidotherapy 10 Minutes   LUE Fluidotherapy Location Hand;Wrist   Comments AROM at [facility] in fluido to decrease pain and increase ROM       fluido for wrist and digits heatingpad around elbow  AROM for wrist flexion , ext, RD and UD AROM for sup/pro  Add wrist extenion PROM - prayer stretch towards  her Tendon glides  10 reps each hold 3 sec  Add light blue putty for gripping - but pain free - 12 reps  2 x day   PROM for elbow flexion and extention - palm up , down and neutral 10 reps each  Trigger point on volar elbow - very tender - soft tissue mobs after  [place] at 3.3MHZ at 50% static 4 min - 0.8 intensity - prior to soft tissue  No pain after wards - increase to -14 extention   Add to HEP light blue putty for grip , gentle prayer stretch towards her - 10 reps  Pain free   Pt to cont with shoulder and  D1 and D2  Patterns AROM               OT Education - 09/18/16 1742    Education provided Yes   Education Details HEP changes    Person(s) Educated Patient   Methods Explanation;Demonstration;Tactile cues;Verbal cues   Comprehension Verbal cues required;Returned demonstration;Verbalized understanding          OT Short Term Goals - 09/06/16 [id number]  OT SHORT TERM GOAL #1   Title Pt to be ind in HEP to increase AROM in L elbow flexion and extention to put hand in pocket and put contact lenses in    Baseline flexion 140; extention -24    Status Achieved           OT Long Term Goals - 09/06/16 [id number]      OT LONG TERM GOAL #1   Title Upgrade goals to increase strength  in L Elbow to increase functional use of L arm   Baseline only gentle PROM and AROM to elbow   Time 4   Period Weeks   Status On-going     OT LONG TERM GOAL #2   Title Assess wrist AROM when ordered by MD    Baseline Wrist in splint - lunate fx   Time 4   Period Weeks   Status On-going               Plan - 09/18/16 1743    Clinical Impression Statement Pt cont to make progress in ROM at wrist and elbow - add some PROM for wrist ext , easy putty for grip - elbow improving in ROM and pain - pt to cont to work on end range for ext -and will add 1 lbs for strenghtening next week - pt cont to have issue with ? concusstion - pt to ask MD on Friday about referral to Dr Manuella Ghazi neurology     Rehab Potential Good   OT Duration 4 weeks   OT Treatment/Interventions Self-care/ADL training;Moist Heat;Fluidtherapy;Patient/family education;Splinting;Therapeutic exercises;Contrast Bath;Manual Therapy;Passive range of motion   Plan upgrade HEP - assess pain    OT Home Exercise Plan see pt instruction    Consulted and Agree with Plan of Care Patient      Patient will benefit from skilled therapeutic intervention in order to improve the following deficits and impairments:  Decreased range of motion, Impaired flexibility, Increased edema, Impaired UE functional use, Pain, Decreased strength  Visit Diagnosis: Pain in left elbow  Stiffness of left elbow, not elsewhere classified  Stiffness of left wrist, not elsewhere classified  Pain in left wrist    Problem List Patient Active Problem List   Diagnosis Date Noted  . Allergic rhinitis, seasonal 11/13/2015  . Avitaminosis D 11/13/2015  . Essential (primary) hypertension 08/28/2015  . Gastro-esophageal reflux disease without esophagitis 08/28/2015  . Combined fat and carbohydrate induced hyperlipemia 08/28/2015  . Malignant neoplasm of breast ([place]) 07/13/2014  . HLD (hyperlipidemia) 05/09/2014  . Inflamed nasal mucosa 05/09/2014  . Acid reflux 04/06/2014  . BP (high blood pressure) 04/06/2014  . Adult hypothyroidism 04/06/2014  . Degenerative arthritis of lumbar spine 04/06/2014    Rosalyn Gess OTR/L,CLT 09/18/2016, 6:36 PM  [place] [facility] [address], [place], [address] Phone: [phone]   Fax:  [phone]  Name: Margaret Townsend MRN: [id number] Date of Birth: 1946/05/03

## 2016-09-18 NOTE — Patient Instructions (Addendum)
   Add to HEP light blue putty for grip , gentle prayer stretch towards her - 10 reps  Pain free   Pt to cont with shoulder and  D1 and D2  Patterns AROM

## 2016-09-20 DIAGNOSIS — R2689 Other abnormalities of gait and mobility: Secondary | ICD-10-CM | POA: Diagnosis not present

## 2016-09-20 DIAGNOSIS — Z131 Encounter for screening for diabetes mellitus: Secondary | ICD-10-CM | POA: Diagnosis not present

## 2016-09-20 DIAGNOSIS — M62838 Other muscle spasm: Secondary | ICD-10-CM | POA: Diagnosis not present

## 2016-09-20 DIAGNOSIS — M5030 Other cervical disc degeneration, unspecified cervical region: Secondary | ICD-10-CM | POA: Diagnosis not present

## 2016-09-20 DIAGNOSIS — R740 Nonspecific elevation of levels of transaminase and lactic acid dehydrogenase [LDH]: Secondary | ICD-10-CM | POA: Diagnosis not present

## 2016-09-20 DIAGNOSIS — R51 Headache: Secondary | ICD-10-CM | POA: Diagnosis not present

## 2016-09-21 DIAGNOSIS — M5030 Other cervical disc degeneration, unspecified cervical region: Secondary | ICD-10-CM | POA: Insufficient documentation

## 2016-09-23 DIAGNOSIS — C50911 Malignant neoplasm of unspecified site of right female breast: Secondary | ICD-10-CM | POA: Diagnosis not present

## 2016-09-23 DIAGNOSIS — Z79899 Other long term (current) drug therapy: Secondary | ICD-10-CM | POA: Diagnosis not present

## 2016-09-23 DIAGNOSIS — Z8041 Family history of malignant neoplasm of ovary: Secondary | ICD-10-CM | POA: Diagnosis not present

## 2016-09-23 DIAGNOSIS — Z7951 Long term (current) use of inhaled steroids: Secondary | ICD-10-CM | POA: Diagnosis not present

## 2016-09-23 DIAGNOSIS — Z08 Encounter for follow-up examination after completed treatment for malignant neoplasm: Secondary | ICD-10-CM | POA: Diagnosis not present

## 2016-09-23 DIAGNOSIS — Z853 Personal history of malignant neoplasm of breast: Secondary | ICD-10-CM | POA: Diagnosis not present

## 2016-09-23 DIAGNOSIS — R921 Mammographic calcification found on diagnostic imaging of breast: Secondary | ICD-10-CM | POA: Diagnosis not present

## 2016-09-23 DIAGNOSIS — Z170 Estrogen receptor positive status [ER+]: Secondary | ICD-10-CM | POA: Diagnosis not present

## 2016-09-23 DIAGNOSIS — Z9289 Personal history of other medical treatment: Secondary | ICD-10-CM | POA: Diagnosis not present

## 2016-09-23 DIAGNOSIS — Z885 Allergy status to narcotic agent status: Secondary | ICD-10-CM | POA: Diagnosis not present

## 2016-09-23 DIAGNOSIS — R922 Inconclusive mammogram: Secondary | ICD-10-CM | POA: Diagnosis not present

## 2016-09-23 DIAGNOSIS — Z9011 Acquired absence of right breast and nipple: Secondary | ICD-10-CM | POA: Diagnosis not present

## 2016-09-24 ENCOUNTER — Ambulatory Visit: Payer: Commercial Managed Care - HMO | Admitting: Occupational Therapy

## 2016-09-24 DIAGNOSIS — M25632 Stiffness of left wrist, not elsewhere classified: Secondary | ICD-10-CM | POA: Diagnosis not present

## 2016-09-24 DIAGNOSIS — M25522 Pain in left elbow: Secondary | ICD-10-CM

## 2016-09-24 DIAGNOSIS — M25532 Pain in left wrist: Secondary | ICD-10-CM | POA: Diagnosis not present

## 2016-09-24 DIAGNOSIS — M25622 Stiffness of left elbow, not elsewhere classified: Secondary | ICD-10-CM

## 2016-09-24 IMAGING — CR DG MYELOGRAPHY LUMBAR INJ LUMBOSACRAL
4 of 5 series · 14 of 19 positions shown · non-contrast
Comparison: Lumbar myelogram 01/23/2016.

CLINICAL DATA: Low back pain. LEFT leg pain. Patient awoke with the
symptoms in mid Jumper.
TECHNIQUE: Contiguous axial images were obtained through the Lumbar spine after
the intrathecal infusion of infusion. Coronal and sagittal
reconstructions were obtained of the axial image sets.

[Series 1: ortho standard · 11 of 15 slices shown]
[im 1/15]
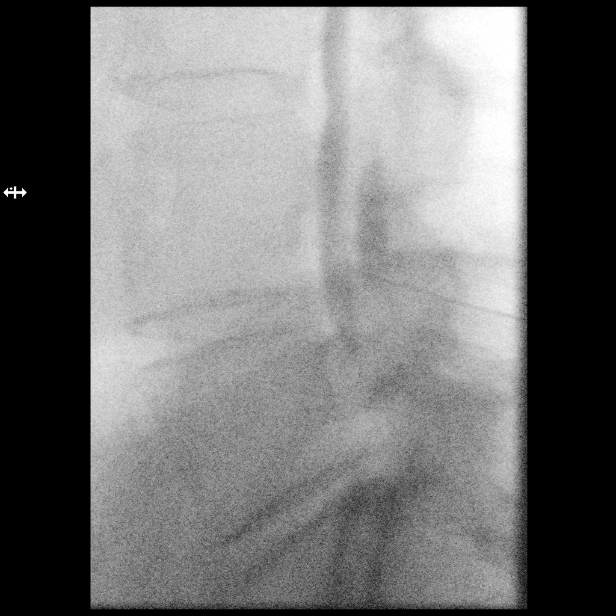
[im 3/15]
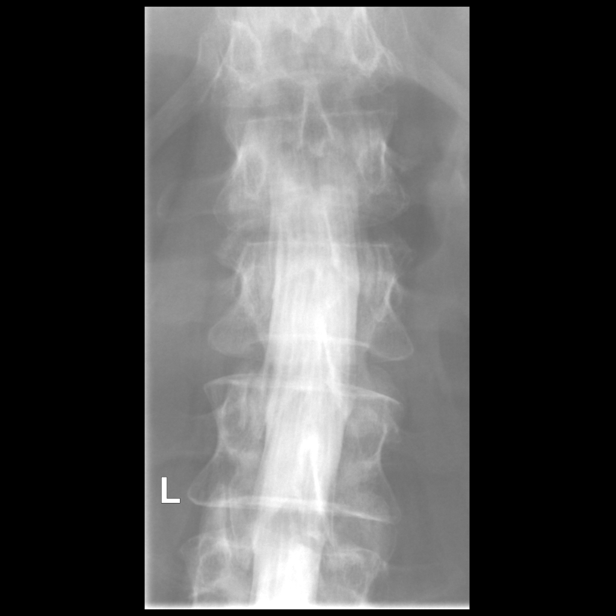
[im 4/15]
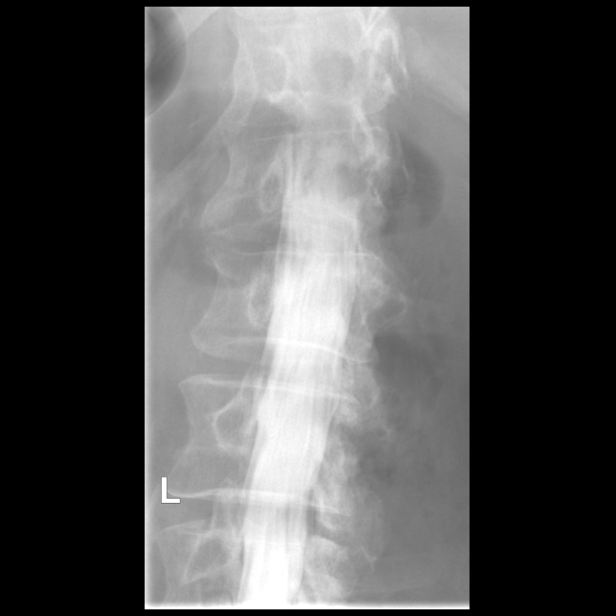
[im 5/15]
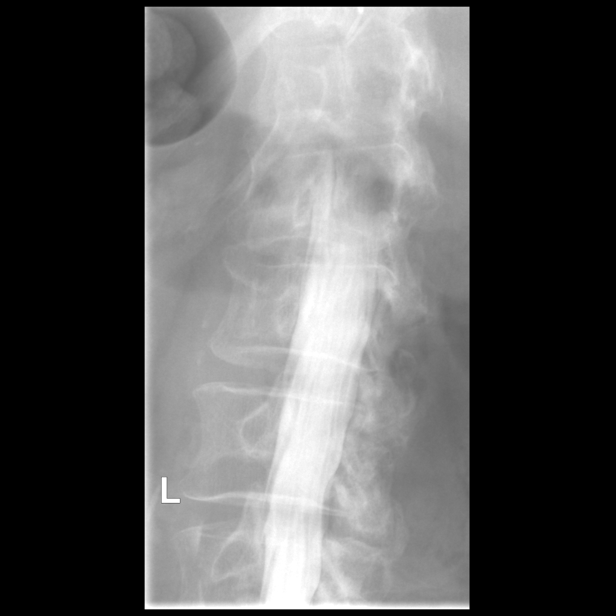
[im 7/15]
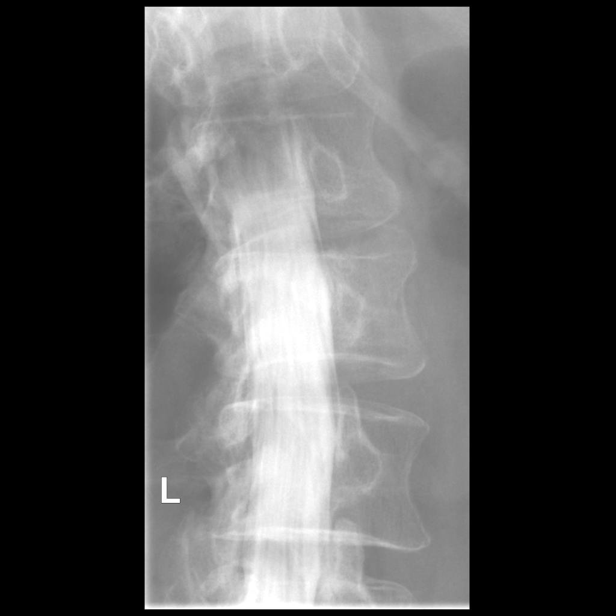
[im 8/15]
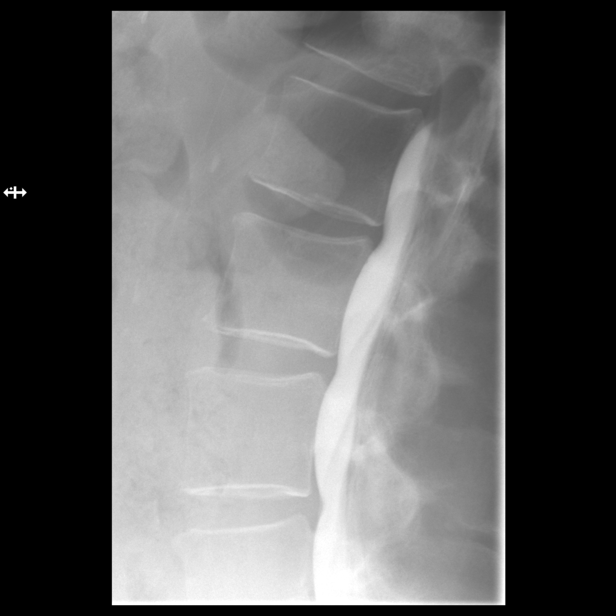
[im 9/15]
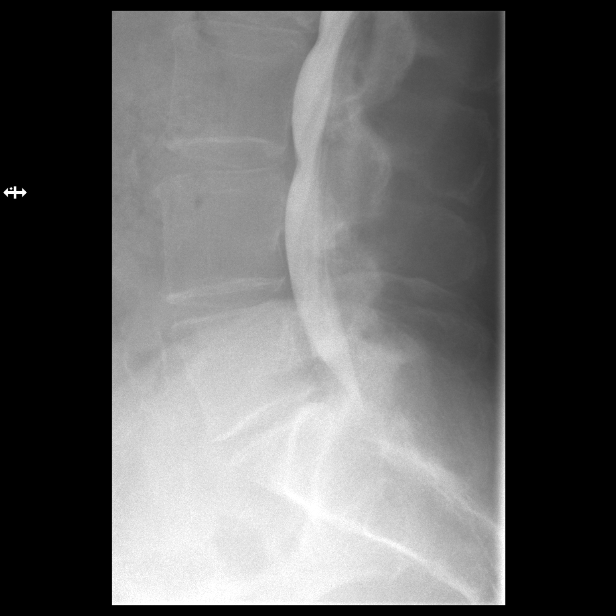
[im 11/15]
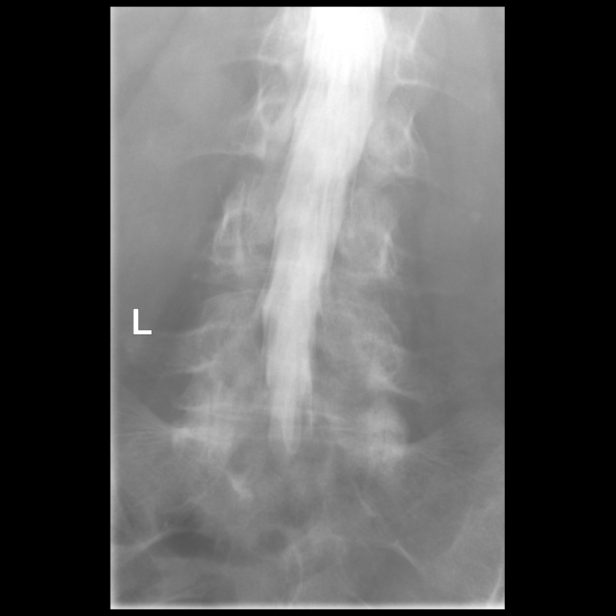
[im 12/15]
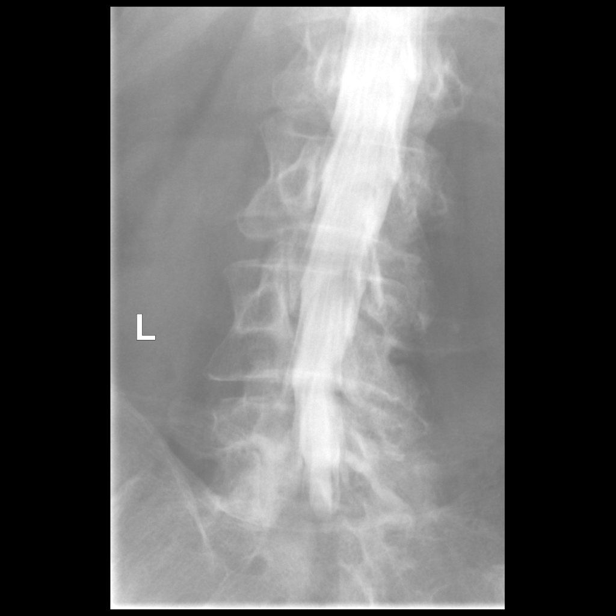
[im 13/15]
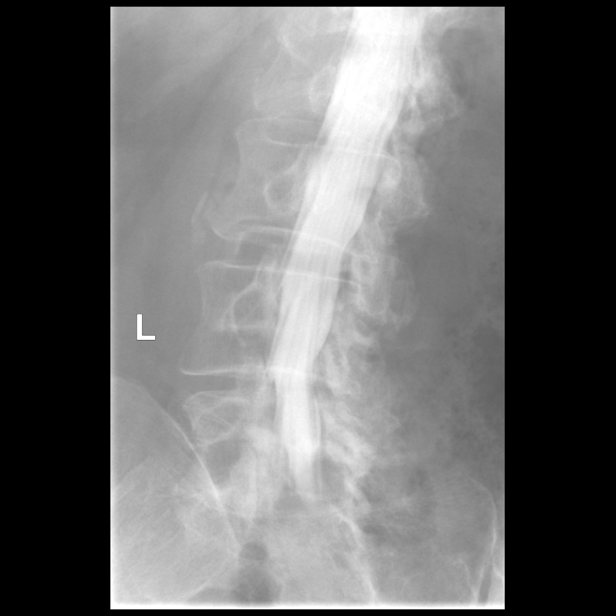
[im 15/15]
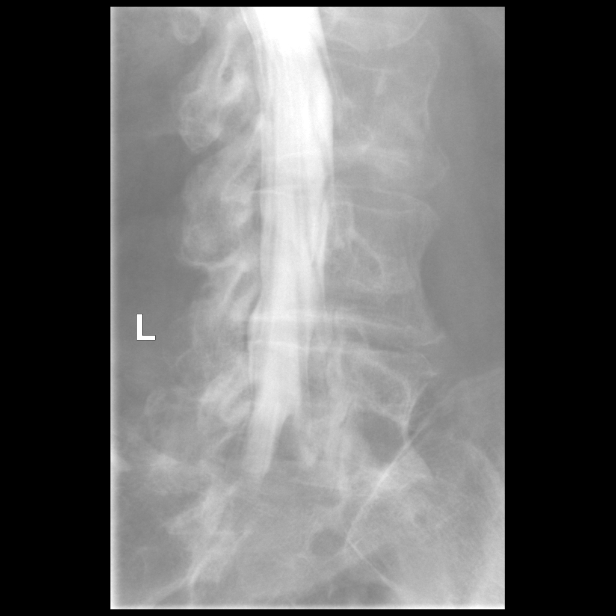

[w lumbar spine ap]
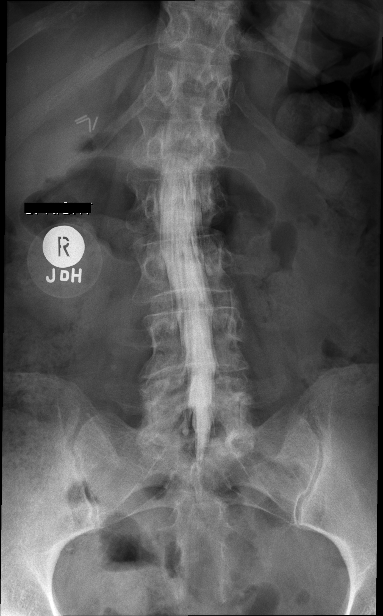

[w lumbar spine lat]
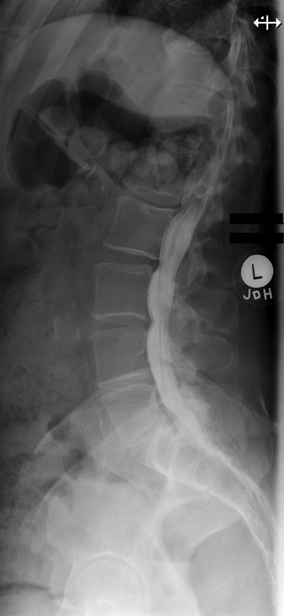

[w lumbar spine extension]
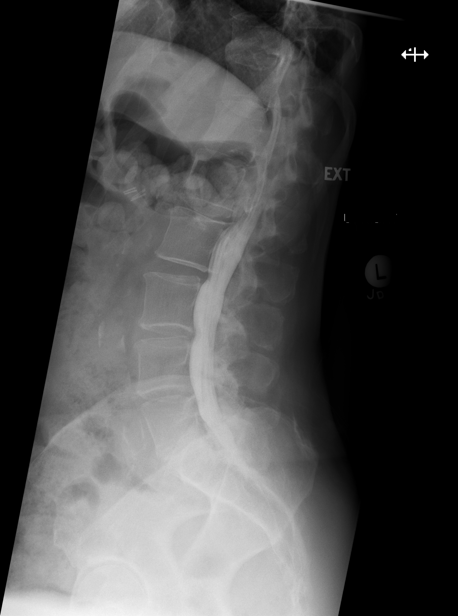

[14 of 19 positions shown; findings below may reference images not displayed]

EXAM:
LUMBAR MYELOGRAM

FLUOROSCOPY TIME:  51 seconds corresponding to a Dose Area Product
of 228.59 ?Gy*m2

PROCEDURE:
After thorough discussion of risks and benefits of the procedure
including bleeding, infection, injury to nerves, blood vessels,
adjacent structures as well as headache and CSF leak, written and
oral informed consent was obtained. Consent was obtained by Dr. Inger-Hilde
[place]. Time out form was completed.

Patient was positioned prone on the fluoroscopy table. Local
anesthesia was provided with 1% lidocaine without epinephrine after
prepped and draped in the usual sterile fashion. Puncture was
performed at L4-5 using a 3 1/2 inch 22-gauge spinal needle via
midline approach. Using a single pass through the dura, the needle
was placed within the thecal sac, with return of clear CSF. 15 mL of
Isovue M 200 was injected into the thecal sac, with normal
opacification of the nerve roots and cauda equina consistent with
free flow within the subarachnoid space.

I personally performed the lumbar puncture and administered the
intrathecal contrast. I also personally supervised acquisition of
the myelogram images.
FINDINGS: LUMBAR MYELOGRAM FINDINGS:

Good opacification lumbar subarachnoid space. Slight degenerative
scoliosis convex RIGHT. Disc space narrowing at L4-5 and L5-S1
related to prior surgery. No frank nerve root cut off or spinal
stenosis at the surgical levels. There is slight effacement of the
LEFT L3 root due to subarticular zone narrowing, but no frank L3
nerve root cut off. Shallow ventral defects at L1-2, L2-3, and L3-4,
with moderate ventral defect at T12-L1. Slight degenerative
scoliosis convex RIGHT upper lumbar region.

Standing flexion extension demonstrates no dynamic instability.

CT LUMBAR MYELOGRAM FINDINGS:

No prevertebral or paraspinous masses. No worrisome osseous lesions.
Aortic atherosclerosis without aneurysmal dilatation.
Cholecystectomy changes.

T12-L1: Central and leftward extrusion. Effacement anterior
subarachnoid space. LEFT L1 nerve root impingement is possible.

L1-L2: Central and leftward protrusion. Slight subarticular zone
narrowing. LEFT L2 nerve root impingement is possible.

L2-L3:  Mild bulge.  No impingement.

L3-L4: Central protrusion. Posterior element hypertrophy. Slight
RIGHT subarticular zone narrowing could affect the L4 nerve root.

L4-L5: Disc space narrowing. LEFT hemilaminectomy. Advanced facet
arthropathy is worse on the RIGHT. Central disc osteophyte complex
with osseous ridging extending to the RIGHT foramen and beyond.
RIGHT greater than LEFT L4 nerve root impingement in the foramen. No
definite L5 nerve root impingement in the canal/subarticular zone.

L5-S1: Disc space narrowing. Suspect prior LEFT laminectomy
Posterior element hypertrophy. Osseous ridging projects more to the
LEFT than RIGHT. No subarticular zone narrowing of significance.
LEFT greater than RIGHT L5 nerve root impingement is possible due to
bony foraminal narrowing.

Compared with prior MR, good general agreement.
IMPRESSION: LUMBAR MYELOGRAM IMPRESSION:

Multilevel spondylosis without significant stenosis or LEFT-sided
nerve root cut off. No dynamic instability with patient standing.

CT LUMBAR MYELOGRAM IMPRESSION:

Potentially symptomatic LEFT-sided foraminal narrowing at L5-S1 due
to osseous ridging. All

Central and leftward extrusion at T12-L1 and central and leftward
protrusion at L1-L2 could affect the LEFT-sided nerve roots on that
side.

## 2016-09-24 NOTE — Patient Instructions (Addendum)
Same HEP add - practice  showing different signs with fingers - pt to work on 3 digits, 2 digits , peace,ect 10 reps each hold 3 sec   light blue putty for gripping - but pain free - 12 reps  Add 3 point and lat grip pain free  2 x day   PROM for elbow extention - palm up , down and neutral 10 reps each  AROM for elbow in all planes  Add   1 lbs for wrist flexion and extention 5 reps each

## 2016-09-24 NOTE — Therapy (Signed)
[facility] [address] [place], [address] Phone: [phone]   Fax:  [phone]  Occupational Therapy Treatment  Patient Details  Name: Margaret Townsend MRN: [id number] Date of Birth: 11/30/1946 Referring Provider: Collier Bullock  Encounter Date: 09/24/2016      OT End of Session - 09/24/16 0922    Visit Number 8   Number of Visits 16   Date for OT Re-Evaluation 10/18/16   OT Start Time 0905   OT Stop Time 0950   OT Time Calculation (min) 45 min   Activity Tolerance Patient tolerated treatment well   Behavior During Therapy [facility] for tasks assessed/performed      Past Medical History:  Diagnosis Date  . Cancer (HCC)    breast  . Hypertension   . Thyroid disease     Past Surgical History:  Procedure Laterality Date  . ABDOMINAL HYSTERECTOMY    . BREAST SURGERY    . MASTECTOMY      There were no vitals filed for this visit.      Subjective Assessment - 09/24/16 0918    Subjective  Doing better my dizziness- not appt with neurologist until Dec - my hand and wrist stiff this am - exercises did okay    Patient Stated Goals Want to get the motion and use of L hand and arm back - to cut hair, put contact lenses, sewing , yard work , dressing ,    Pain Score 1    Pain Location Wrist   Pain Orientation Left   Pain Descriptors / Indicators Aching                      OT Treatments/Exercises (OP) - 09/24/16 0001      LUE Fluidotherapy   Number Minutes Fluidotherapy 10 Minutes   LUE Fluidotherapy Location Hand;Wrist   Comments AROm for wrist in all planes - heatingpad on elbow while in fluido     luido for wrist and digits heatingpad around elbow   AROM for wrist flexion , ext, RD and UD AROM for sup/pro  wrist extenion PROM - prayer stretch towards her  add gentle PROM for wrist flexion  Tendon glides - pt hard time showing different signs with fingers - pt to work on 3  digits, 2 digits , peace,ect 10 reps each hold 3 sec  Reviewed light blue putty for gripping - but pain free - 12 reps  Add 3 point and lat grip pain free  2 x day   PROM for elbow extention - palm up , down and neutral 10 reps each  Trigger point on volar elbow - very tender - soft tissue mobs after  [place] at 3.3MHZ at 50% static 4 min - 0.8 intensity - prior to soft tissue  No pain after wards AROM on wall in full extention 3 cm foam block at wrist away from wall  8 reps each position   1 lbs for wrist flexion and extention 5 reps each - and add to HEP               OT Education - 09/24/16 0921    Education Details HEP update   Person(s) Educated Patient   Methods Explanation;Demonstration;Tactile cues;Verbal cues   Comprehension Verbalized understanding;Returned demonstration;Verbal cues required          OT Short Term Goals - 09/06/16 0937      OT SHORT TERM GOAL #1  Title Pt to be ind in HEP to increase AROM in L elbow flexion and extention to put hand in pocket and put contact lenses in    Baseline flexion 140; extention -24    Status Achieved           OT Long Term Goals - 09/06/16 [id number]      OT LONG TERM GOAL #1   Title Upgrade goals to increase strength  in L Elbow to increase functional use of L arm   Baseline only gentle PROM and AROM to elbow   Time 4   Period Weeks   Status On-going     OT LONG TERM GOAL #2   Title Assess wrist AROM when ordered by MD    Baseline Wrist in splint - lunate fx   Time 4   Period Weeks   Status On-going               Plan - 09/24/16 [id number]    Clinical Impression Statement Pt making progress in ROM at wrist nad elbow - wrist extention and flexion still impaired - and pain with individual digits AROM - tolerance for 1 lbs weight and adding prehension  for putty was great - add to HEP this date    Rehab Potential Good   OT Frequency 2x / week   OT Duration 4 weeks   OT Treatment/Interventions  Self-care/ADL training;Moist Heat;Fluidtherapy;Patient/family education;Splinting;Therapeutic exercises;Contrast Bath;Manual Therapy;Passive range of motion   Plan tolerance with add 1 lbs weight and prehension for light blue putty    OT Home Exercise Plan see pt instruction    Consulted and Agree with Plan of Care Patient      Patient will benefit from skilled therapeutic intervention in order to improve the following deficits and impairments:  Decreased range of motion, Impaired flexibility, Increased edema, Impaired UE functional use, Pain, Decreased strength  Visit Diagnosis: Pain in left elbow  Stiffness of left elbow, not elsewhere classified  Stiffness of left wrist, not elsewhere classified  Pain in left wrist    Problem List Patient Active Problem List   Diagnosis Date Noted  . Allergic rhinitis, seasonal 11/13/2015  . Avitaminosis D 11/13/2015  . Essential (primary) hypertension 08/28/2015  . Gastro-esophageal reflux disease without esophagitis 08/28/2015  . Combined fat and carbohydrate induced hyperlipemia 08/28/2015  . Malignant neoplasm of breast ([place]) 07/13/2014  . HLD (hyperlipidemia) 05/09/2014  . Inflamed nasal mucosa 05/09/2014  . Acid reflux 04/06/2014  . BP (high blood pressure) 04/06/2014  . Adult hypothyroidism 04/06/2014  . Degenerative arthritis of lumbar spine 04/06/2014    Rosalyn Gess OTR/L,CLT 09/24/2016, 6:30 PM  [facility] [address], [place], [address] Phone: [phone]   Fax:  [phone]  Name: Margaret Townsend MRN: [id number] Date of Birth: 04-27-1946

## 2016-09-26 ENCOUNTER — Ambulatory Visit: Payer: Commercial Managed Care - HMO | Admitting: Occupational Therapy

## 2016-09-26 ENCOUNTER — Encounter: Payer: Self-pay | Admitting: Occupational Therapy

## 2016-09-26 DIAGNOSIS — M25632 Stiffness of left wrist, not elsewhere classified: Secondary | ICD-10-CM

## 2016-09-26 DIAGNOSIS — M25622 Stiffness of left elbow, not elsewhere classified: Secondary | ICD-10-CM

## 2016-09-26 DIAGNOSIS — M25532 Pain in left wrist: Secondary | ICD-10-CM | POA: Diagnosis not present

## 2016-09-26 DIAGNOSIS — M25522 Pain in left elbow: Secondary | ICD-10-CM | POA: Diagnosis not present

## 2016-09-26 NOTE — Therapy (Signed)
[facility] [address] [place], [address] Phone: [phone]   Fax:  [phone]  Occupational Therapy Treatment  Patient Details  Name: Margaret Townsend MRN: [id number] Date of Birth: 31-Mar-1946 Referring Provider: Collier Bullock  Encounter Date: 09/26/2016      OT End of Session - 09/26/16 1151    Visit Number 9   Number of Visits 16   Date for OT Re-Evaluation 10/18/16   OT Start Time 0905   OT Stop Time 0959   OT Time Calculation (min) 54 min   Activity Tolerance Patient tolerated treatment well   Behavior During Therapy [facility] for tasks assessed/performed      Past Medical History:  Diagnosis Date  . Cancer (HCC)    breast  . Hypertension   . Thyroid disease     Past Surgical History:  Procedure Laterality Date  . ABDOMINAL HYSTERECTOMY    . BREAST SURGERY    . MASTECTOMY      There were no vitals filed for this visit.      Subjective Assessment - 09/26/16 0912    Subjective  Pt reports that she is having pain L thumb ranging from 0-2/10 "depending on what I do" Pt also reports morning stiffness   Patient Stated Goals Want to get the motion and use of L hand and arm back - to cut hair, put contact lenses, sewing , yard work , dressing ,    Currently in Pain? Yes   Pain Score 2    Pain Location Finger (Comment which one)   Pain Orientation Left   Pain Descriptors / Indicators Aching   Pain Type Acute pain   Pain Onset More than a month ago   Pain Frequency Intermittent   Multiple Pain Sites No                      OT Treatments/Exercises (OP) - 09/26/16 0001      Exercises   Exercises Elbow;Wrist;Hand     Elbow Exercises   Other elbow exercises AROM for left wrist flexion; Extension, RD/UD; Forearm Pronation/supination;. PROM Wrist extension, prayer stretch toward her. Tendon gliding (AROM) 10 reps each hold x3 seconds. Pt performed Gentle PROM for L elbow  extension, palm up, down and neutral x10 reps each AROM at wall for left elbow extension w/ small foam block at wrist away from wall - palm in pronation, supination and neurtal x8 reps each   Other elbow exercises Light blue putty Painfree x5 reps each. Added tip to tip prehension ex's with putty to HEP. Pt verbalized understanding in clinic today.     Modalities   Modalities Ultrasound     Moist Heat Therapy   Number Minutes Moist Heat 10 Minutes   Moist Heat Location Elbow  Pt with redness @ medial epicondyle after treatment noted.      Ultrasound   Ultrasound Location L elbow medial epicondyle area   Ultrasound Parameters Pulsed [place] @ 1.0 w/cm2 w/ 3Mhz x8 min   Ultrasound Goals Edema;Pain  Prior to soft tissue mobilization.     LUE Fluidotherapy   Number Minutes Fluidotherapy 10 Minutes   LUE Fluidotherapy Location Hand;Wrist   Comments AROM for wrist in all planes - heatingpad on elbow while in fluido      Manual Therapy   Manual Therapy Soft tissue mobilization   Soft tissue mobilization Left elbow bicepstrigger point volar elbow x8 min  OT Education - 09/26/16 1151    Education provided Yes   Education Details HEP update   Person(s) Educated Patient   Methods Explanation;Demonstration;Verbal cues;Tactile cues   Comprehension Verbalized understanding;Returned demonstration          OT Short Term Goals - 09/06/16 0937      OT SHORT TERM GOAL #1   Title Pt to be ind in HEP to increase AROM in L elbow flexion and extention to put hand in pocket and put contact lenses in    Baseline flexion 140; extention -24    Status Achieved           OT Long Term Goals - 09/06/16 [id number]      OT LONG TERM GOAL #1   Title Upgrade goals to increase strength  in L Elbow to increase functional use of L arm   Baseline only gentle PROM and AROM to elbow   Time 4   Period Weeks   Status On-going     OT LONG TERM GOAL #2   Title Assess wrist AROM when  ordered by MD    Baseline Wrist in splint - lunate fx   Time 4   Period Weeks   Status On-going               Plan - 09/26/16 1152    Clinical Impression Statement Pt cont to demonstrate gains in AROM L wrist and elbow. Soreness/pain appears to be limiting factor and isolated finger AROM is difficult. Pt with some redness left volar elbow after moist heat today - pt educated to monitor - no c/o per her report.   Rehab Potential Good   OT Frequency 2x / week   OT Duration 4 weeks   OT Treatment/Interventions Self-care/ADL training;Moist Heat;Fluidtherapy;Patient/family education;Splinting;Therapeutic exercises;Contrast Bath;Manual Therapy;Passive range of motion   Plan Check/review upgraded HEP LUE  - 1# weight at home and tip to tip prehension w/ light blue putty.   OT Home Exercise Plan see pt instruction    Consulted and Agree with Plan of Care Patient      Patient will benefit from skilled therapeutic intervention in order to improve the following deficits and impairments:  Decreased range of motion, Impaired flexibility, Increased edema, Impaired UE functional use, Pain, Decreased strength  Visit Diagnosis: Pain in left elbow  Pain in left wrist  Stiffness of left elbow, not elsewhere classified  Stiffness of left wrist, not elsewhere classified    Problem List Patient Active Problem List   Diagnosis Date Noted  . Allergic rhinitis, seasonal 11/13/2015  . Avitaminosis D 11/13/2015  . Essential (primary) hypertension 08/28/2015  . Gastro-esophageal reflux disease without esophagitis 08/28/2015  . Combined fat and carbohydrate induced hyperlipemia 08/28/2015  . Malignant neoplasm of breast ([place]) 07/13/2014  . HLD (hyperlipidemia) 05/09/2014  . Inflamed nasal mucosa 05/09/2014  . Acid reflux 04/06/2014  . BP (high blood pressure) 04/06/2014  . Adult hypothyroidism 04/06/2014  . Degenerative arthritis of lumbar spine 04/06/2014    Percell Miller Beth Dixon,  OTR/L 09/26/2016, 11:57 AM  [facility] [address] [place], [address] Phone: [phone]   Fax:  [phone]  Name: Margaret Townsend MRN: [id number] Date of Birth: 1946/01/01

## 2016-10-03 ENCOUNTER — Ambulatory Visit: Payer: Commercial Managed Care - HMO | Attending: Family | Admitting: Occupational Therapy

## 2016-10-03 DIAGNOSIS — M25522 Pain in left elbow: Secondary | ICD-10-CM | POA: Diagnosis not present

## 2016-10-03 DIAGNOSIS — M25632 Stiffness of left wrist, not elsewhere classified: Secondary | ICD-10-CM | POA: Diagnosis not present

## 2016-10-03 DIAGNOSIS — M25622 Stiffness of left elbow, not elsewhere classified: Secondary | ICD-10-CM | POA: Diagnosis not present

## 2016-10-03 DIAGNOSIS — M25532 Pain in left wrist: Secondary | ICD-10-CM | POA: Diagnosis not present

## 2016-10-03 NOTE — Patient Instructions (Addendum)
Tendon glides - to cont with  10 reps each hold 3 sec  Reviewed light blue putty for gripping - but pain free - 12 reps  Add 3 point and lat grip pain free  2 x day   PROM for elbow extention - palm up , down and neutral 10 reps each  AROM on wall in full extention - 3 positions touching wall  Add YTB for elbow extention - 3 positions - was unable to do flexion - increase wrist pain   5-10 reps each position   1 lbs for wrist flexion and extention 5 reps each  And upgrade to 2 lbs for RD, UD , sup/pro 10 reps

## 2016-10-03 NOTE — Therapy (Signed)
[facility] [address], [place], [address] Phone: [phone]   Fax:  [phone]  Occupational Therapy Treatment  Patient Details  Name: Margaret Townsend MRN: [id number] Date of Birth: 01-28-1946 Referring Provider: Collier Bullock  Encounter Date: 10/03/2016      OT End of Session - 10/03/16 1402    Visit Number 10   Number of Visits 16   Date for OT Re-Evaluation 10/18/16   OT Start Time 0930   OT Stop Time 1018   OT Time Calculation (min) 48 min   Activity Tolerance Patient tolerated treatment well   Behavior During Therapy [facility] for tasks assessed/performed      Past Medical History:  Diagnosis Date  . Cancer (HCC)    breast  . Hypertension   . Thyroid disease     Past Surgical History:  Procedure Laterality Date  . ABDOMINAL HYSTERECTOMY    . BREAST SURGERY    . MASTECTOMY      There were no vitals filed for this visit.      Subjective Assessment - 10/03/16 0933    Subjective  I am using it little more like fixing hair now , folding laundry- but getting bored at home - still little dizzy feeling    Patient Stated Goals Want to get the motion and use of L hand and arm back - to cut hair, put contact lenses, sewing , yard work , dressing ,    Currently in Pain? Yes   Pain Score 1    Pain Location Finger (Comment which one)   Pain Orientation Left   Pain Descriptors / Indicators Aching            [facility] OT Assessment - 10/03/16 0001      AROM   Left Elbow Flexion 150   Left Elbow Extension -24   Left Wrist Extension 45 Degrees   Left Wrist Flexion 60 Degrees   Left Wrist Radial Deviation 24 Degrees   Left Wrist Ulnar Deviation 30 Degrees                  OT Treatments/Exercises (OP) - 10/03/16 0001      LUE Fluidotherapy   Number Minutes Fluidotherapy 10 Minutes   LUE Fluidotherapy Location Hand;Wrist   Comments AROM for wrist and thumb in all planes - at [facility]  to decrease pain and increase ROM        Fuido for wrist and digits heatingpad around elbow   Measurements taken for ROM at elbow and wrist  At [facility]  wrist extenion PROM - prayer stretch , and can do wall slides    Tendon glides - to cont with  10 reps each hold 3 sec  Reviewed light blue putty for gripping - but pain free - 12 reps  Add 3 point and lat grip pain free  2 x day   PROM for elbow extention - palm up , down and neutral 10 reps each  Trigger point on volar elbow - very tender - soft tissue mobs after  [place] at 3.3MHZ at 50% static 4 min - 0.8 intensity - prior to soft tissue  No pain after wards  CPM on BTE for wrist extention 200 sec   AROM on wall in full extention - 3 positions touching wall  Add YTB for elbow extention - 3 positions - was unable to do flexion - increase wrist pain   5-10 reps each position  1 lbs for wrist flexion and extention 5 reps each  And upgrade to 2 lbs for RD, UD , sup/pro 10 reps              OT Education - 10/03/16 1401    Education provided Yes   Education Details HEP changes    Person(s) Educated Patient   Methods Explanation;Demonstration;Tactile cues;Verbal cues;Handout   Comprehension Verbal cues required;Returned demonstration;Verbalized understanding          OT Short Term Goals - 09/06/16 0937      OT SHORT TERM GOAL #1   Title Pt to be ind in HEP to increase AROM in L elbow flexion and extention to put hand in pocket and put contact lenses in    Baseline flexion 140; extention -24    Status Achieved           OT Long Term Goals - 09/06/16 [id number]      OT LONG TERM GOAL #1   Title Upgrade goals to increase strength  in L Elbow to increase functional use of L arm   Baseline only gentle PROM and AROM to elbow   Time 4   Period Weeks   Status On-going     OT LONG TERM GOAL #2   Title Assess wrist AROM when ordered by MD    Baseline Wrist in splint - lunate fx   Time 4   Period Weeks   Status  On-going               Plan - 10/03/16 1402    Clinical Impression Statement Pt cont to make great progress in AROM in elbow extention - and strength - able to increase to 2 lbs and YTB for extention -thumb CMC bothering her and wrist extention  this date- Pt to focus on PROM for wrist extention    Rehab Potential Good   OT Frequency 2x / week   OT Duration 4 weeks   OT Treatment/Interventions Self-care/ADL training;Moist Heat;Fluidtherapy;Patient/family education;Splinting;Therapeutic exercises;Contrast Bath;Manual Therapy;Passive range of motion   Plan assess how HEP doing with increase to 2 lbs , and YTB    OT Home Exercise Plan see pt instruction    Consulted and Agree with Plan of Care Patient      Patient will benefit from skilled therapeutic intervention in order to improve the following deficits and impairments:  Decreased range of motion, Impaired flexibility, Increased edema, Impaired UE functional use, Pain, Decreased strength  Visit Diagnosis: Pain in left elbow  Pain in left wrist  Stiffness of left elbow, not elsewhere classified  Stiffness of left wrist, not elsewhere classified    Problem List Patient Active Problem List   Diagnosis Date Noted  . Allergic rhinitis, seasonal 11/13/2015  . Avitaminosis D 11/13/2015  . Essential (primary) hypertension 08/28/2015  . Gastro-esophageal reflux disease without esophagitis 08/28/2015  . Combined fat and carbohydrate induced hyperlipemia 08/28/2015  . Malignant neoplasm of breast ([place]) 07/13/2014  . HLD (hyperlipidemia) 05/09/2014  . Inflamed nasal mucosa 05/09/2014  . Acid reflux 04/06/2014  . BP (high blood pressure) 04/06/2014  . Adult hypothyroidism 04/06/2014  . Degenerative arthritis of lumbar spine 04/06/2014    Rosalyn Gess OTR/L,CLT 10/03/2016, 2:10 PM  [facility] [address], [place], [address] Phone: [phone]    Fax:  [phone]  Name: SYMPHANIE SMALLRIDGE MRN: [id number] Date of Birth: [birth year]

## 2016-10-08 DIAGNOSIS — E2839 Other primary ovarian failure: Secondary | ICD-10-CM | POA: Diagnosis not present

## 2016-10-08 DIAGNOSIS — Z170 Estrogen receptor positive status [ER+]: Secondary | ICD-10-CM | POA: Diagnosis not present

## 2016-10-08 DIAGNOSIS — Z23 Encounter for immunization: Secondary | ICD-10-CM | POA: Diagnosis not present

## 2016-10-08 DIAGNOSIS — C50911 Malignant neoplasm of unspecified site of right female breast: Secondary | ICD-10-CM | POA: Diagnosis not present

## 2016-10-09 ENCOUNTER — Ambulatory Visit: Payer: Commercial Managed Care - HMO | Admitting: Occupational Therapy

## 2016-10-09 DIAGNOSIS — M25532 Pain in left wrist: Secondary | ICD-10-CM | POA: Diagnosis not present

## 2016-10-09 DIAGNOSIS — M25522 Pain in left elbow: Secondary | ICD-10-CM | POA: Diagnosis not present

## 2016-10-09 DIAGNOSIS — M25632 Stiffness of left wrist, not elsewhere classified: Secondary | ICD-10-CM | POA: Diagnosis not present

## 2016-10-09 DIAGNOSIS — M25622 Stiffness of left elbow, not elsewhere classified: Secondary | ICD-10-CM | POA: Diagnosis not present

## 2016-10-09 NOTE — Therapy (Signed)
[facility] [address] [place], [address] Phone: [phone]   Fax:  [phone]  Occupational Therapy Treatment  Patient Details  Name: Margaret Townsend MRN: [id number] Date of Birth: Apr 09, 1946 Referring Provider: Collier Bullock  Encounter Date: 10/09/2016      OT End of Session - 10/09/16 1528    Visit Number 11   Number of Visits 16   Date for OT Re-Evaluation 10/18/16   OT Start Time 0930   OT Stop Time 1029   OT Time Calculation (min) 59 min   Activity Tolerance Patient tolerated treatment well   Behavior During Therapy [facility] for tasks assessed/performed      Past Medical History:  Diagnosis Date  . Cancer (HCC)    breast  . Hypertension   . Thyroid disease     Past Surgical History:  Procedure Laterality Date  . ABDOMINAL HYSTERECTOMY    . BREAST SURGERY    . MASTECTOMY      There were no vitals filed for this visit.      Subjective Assessment - 10/09/16 1453    Subjective  Not as dizzy but my back of head still tight feeling- pain more at my wrist and the base of thumb - MD did say xray showed some arthritis at thumb - pain goes up to 3/10   Patient Stated Goals Want to get the motion and use of L hand and arm back - to cut hair, put contact lenses, sewing , yard work , dressing ,    Currently in Pain? Yes   Pain Score 3    Pain Location Wrist   Pain Orientation Left   Pain Descriptors / Indicators Aching;Sharp;Tender   Pain Type Acute pain   Pain Onset In the past 7 days                      OT Treatments/Exercises (OP) - 10/09/16 0001      Ultrasound   Ultrasound Location volar distal elbow and radial wrist    Ultrasound Parameters 3.3MHZ,, 20% , 0.8 intensity , - 6 min    Ultrasound Goals Pain     LUE Fluidotherapy   Number Minutes Fluidotherapy 10 Minutes   LUE Fluidotherapy Location Hand;Wrist   Comments AT SOC to decrease pain and increase wrist and  diigts ROM       Fuido for wrist and digits heatingpad around elbow   Pain with wrist flexion , ext and RD  At radial wrist  Tender with touch and at distal elbow - but pain less after [place] Fitted with CMC neoprene splint  Had less pain for wrist AROM  attempted 2 lbs - but some pain   decrease HEP to 1 lbs for wrist in all planes with neoprene splint on- 10 reps all planes Gentle PROM for elbow extention  2 lbs for elbow in all planes 6 reps - each of 3 positions To do at home   discharge or on hold YTB   light blue putty for gripping - but pain free - 12 reps but put on hold  3 point and lat grip             OT Education - 10/09/16 1528    Education provided Yes   Education Details HEP   Person(s) Educated Patient   Methods Explanation;Demonstration;Tactile cues;Verbal cues   Comprehension Verbal cues required;Returned demonstration;Verbalized understanding  OT Short Term Goals - 09/06/16 0937      OT SHORT TERM GOAL #1   Title Pt to be ind in HEP to increase AROM in L elbow flexion and extention to put hand in pocket and put contact lenses in    Baseline flexion 140; extention -24    Status Achieved           OT Long Term Goals - 09/06/16 [id number]      OT LONG TERM GOAL #1   Title Upgrade goals to increase strength  in L Elbow to increase functional use of L arm   Baseline only gentle PROM and AROM to elbow   Time 4   Period Weeks   Status On-going     OT LONG TERM GOAL #2   Title Assess wrist AROM when ordered by MD    Baseline Wrist in splint - lunate fx   Time 4   Period Weeks   Status On-going               Plan - 10/09/16 1529    Clinical Impression Statement Pt present this date with more pain at base of CMC of thumb and radial wrist - pt to hold off on using YTB that she probably gripped with her thumb, putty for prehension - put that on hold and decrease weight for wrist to 1 lbs and fitted with CMC neoprene splint    Rehab  Potential Good   OT Frequency 2x / week   OT Duration 4 weeks   OT Treatment/Interventions Self-care/ADL training;Moist Heat;Fluidtherapy;Patient/family education;Splinting;Therapeutic exercises;Contrast Bath;Manual Therapy;Passive range of motion   Plan Assess tolerance and pain with changes of HEP   OT Home Exercise Plan see pt instruction    Consulted and Agree with Plan of Care Patient      Patient will benefit from skilled therapeutic intervention in order to improve the following deficits and impairments:  Decreased range of motion, Impaired flexibility, Increased edema, Impaired UE functional use, Pain, Decreased strength  Visit Diagnosis: Pain in left elbow  Pain in left wrist  Stiffness of left elbow, not elsewhere classified  Stiffness of left wrist, not elsewhere classified    Problem List Patient Active Problem List   Diagnosis Date Noted  . Allergic rhinitis, seasonal 11/13/2015  . Avitaminosis D 11/13/2015  . Essential (primary) hypertension 08/28/2015  . Gastro-esophageal reflux disease without esophagitis 08/28/2015  . Combined fat and carbohydrate induced hyperlipemia 08/28/2015  . Malignant neoplasm of breast ([place]) 07/13/2014  . HLD (hyperlipidemia) 05/09/2014  . Inflamed nasal mucosa 05/09/2014  . Acid reflux 04/06/2014  . BP (high blood pressure) 04/06/2014  . Adult hypothyroidism 04/06/2014  . Degenerative arthritis of lumbar spine 04/06/2014    Rosalyn Gess OTR/L,CLT 10/09/2016, 3:36 PM  [facility] [address] [place], [address] Phone: [phone]   Fax:  [phone]  Name: ALYSSIA GOODE MRN: [id number] Date of Birth: October 31, 1946

## 2016-10-09 NOTE — Patient Instructions (Addendum)
decrease HEP to 1 lbs for wrist in all planes with neoprene splint on- 10 reps all planes Gentle PROM for elbow extention  2 lbs for elbow in all planes 6 reps - each of 3 positions To do at home   discharge or on hold YTB   light blue putty for gripping - but pain free - 12 reps but put on hold  3 point and lat grip  Can wear CMC neoprene splint for activities

## 2016-10-11 DIAGNOSIS — I6523 Occlusion and stenosis of bilateral carotid arteries: Secondary | ICD-10-CM | POA: Diagnosis not present

## 2016-10-11 DIAGNOSIS — R2689 Other abnormalities of gait and mobility: Secondary | ICD-10-CM | POA: Diagnosis not present

## 2016-10-16 ENCOUNTER — Ambulatory Visit: Payer: Commercial Managed Care - HMO | Admitting: Occupational Therapy

## 2016-10-16 DIAGNOSIS — M25522 Pain in left elbow: Secondary | ICD-10-CM

## 2016-10-16 DIAGNOSIS — M25632 Stiffness of left wrist, not elsewhere classified: Secondary | ICD-10-CM | POA: Diagnosis not present

## 2016-10-16 DIAGNOSIS — M25532 Pain in left wrist: Secondary | ICD-10-CM

## 2016-10-16 DIAGNOSIS — M25622 Stiffness of left elbow, not elsewhere classified: Secondary | ICD-10-CM

## 2016-10-16 NOTE — Therapy (Signed)
[place] [facility] [address] [place], [address] Phone: [phone]   Fax:  [phone]  Occupational Therapy Treatment  Patient Details  Name: Margaret Townsend MRN: [id number] Date of Birth: [birth year] Referring Provider: Collier Bullock  Encounter Date: 10/16/2016      OT End of Session - 10/16/16 1021    Visit Number 12   Number of Visits 16   Date for OT Re-Evaluation 10/18/16   OT Start Time 1005   OT Stop Time 1050   OT Time Calculation (min) 45 min   Activity Tolerance Patient tolerated treatment well   Behavior During Therapy [facility] for tasks assessed/performed      Past Medical History:  Diagnosis Date  . Cancer (HCC)    breast  . Hypertension   . Thyroid disease     Past Surgical History:  Procedure Laterality Date  . ABDOMINAL HYSTERECTOMY    . BREAST SURGERY    . MASTECTOMY      There were no vitals filed for this visit.      Subjective Assessment - 10/16/16 1009    Subjective  Since last time still pain - not at the side of thumb but my whole hand- and neck bothering more - and with elbow exercise you feel pull in your neck and shoulder - fingers tips feels like it is burning    Patient Stated Goals Want to get the motion and use of L hand and arm back - to cut hair, put contact lenses, sewing , yard work , dressing ,    Currently in Pain? Yes   Pain Score 3    Pain Location Wrist   Pain Orientation Left   Pain Descriptors / Indicators Burning;Sharp;Aching   Pain Type Acute pain   Pain Onset 1 to 4 weeks ago   Pain Frequency Intermittent                      OT Treatments/Exercises (OP) - 10/16/16 0001      Moist Heat Therapy   Number Minutes Moist Heat 10 Minutes   Moist Heat Location Elbow     Ultrasound   Ultrasound Location radial side of elbow , and  at distal radius head -L   Ultrasound Parameters 3.3MHZ , 20% , 1.0 intensity , 8 min total      LUE  Fluidotherapy   Number Minutes Fluidotherapy 10 Minutes   LUE Fluidotherapy Location Hand;Wrist   Comments at [facility] to decrease pain -     Pain in wrist in all planes  Tender over distal radius head and Finkelstein positive Pt fitted with prefab thumb spica - to wear all the time off with ADL"s and 2 x day for pain free AROM of L wrist and thumb  Fuido for wrist and digits - but pain free range  heatingpad around elbow   Gentle PROM for elbow extention  All 3 planes - then AROM into mat and against wall  attempted 2 lbs - but some pain at wrist even with thumb spica on   decrease HEP to 1 lbs for  elbow in all planes 6 reps - each of 3 positions To do at home   discharge or on hold YTB and 2 lbs  Hold off on any weight for wrist  Hold off on putty               OT Education - 10/16/16 1021  Education provided Yes   Education Details HEP changes   Person(s) Educated Patient   Methods Explanation;Demonstration;Tactile cues;Verbal cues   Comprehension Verbal cues required;Returned demonstration;Verbalized understanding          OT Short Term Goals - 09/06/16 0937      OT SHORT TERM GOAL #1   Title Pt to be ind in HEP to increase AROM in L elbow flexion and extention to put hand in pocket and put contact lenses in    Baseline flexion 140; extention -24    Status Achieved           OT Long Term Goals - 09/06/16 [id number]      OT LONG TERM GOAL #1   Title Upgrade goals to increase strength  in L Elbow to increase functional use of L arm   Baseline only gentle PROM and AROM to elbow   Time 4   Period Weeks   Status On-going     OT LONG TERM GOAL #2   Title Assess wrist AROM when ordered by MD    Baseline Wrist in splint - lunate fx   Time 4   Period Weeks   Status On-going               Plan - 10/16/16 1022    Clinical Impression Statement Pt had increase pain at wrist and hand since last week - pt held off on YTB and decrease wrist to 1 lbs - CMC  neoprene splint but pain this date still in pain - tender over distal radius head and positive Wynn Maudlin - ? DeQuervain - pt fitted this date with thumb prefab spica to wear most all the time - decrease elbow to 1 lbs weight with thumb spica on     Rehab Potential Good   OT Frequency 2x / week   OT Duration 4 weeks   OT Treatment/Interventions Self-care/ADL training;Moist Heat;Fluidtherapy;Patient/family education;Splinting;Therapeutic exercises;Contrast Bath;Manual Therapy;Passive range of motion   Plan assess pain - if improve    OT Home Exercise Plan see pt instruction    Consulted and Agree with Plan of Care Patient      Patient will benefit from skilled therapeutic intervention in order to improve the following deficits and impairments:  Decreased range of motion, Impaired flexibility, Increased edema, Impaired UE functional use, Pain, Decreased strength  Visit Diagnosis: Pain in left elbow  Pain in left wrist  Stiffness of left elbow, not elsewhere classified  Stiffness of left wrist, not elsewhere classified    Problem List Patient Active Problem List   Diagnosis Date Noted  . Allergic rhinitis, seasonal 11/13/2015  . Avitaminosis D 11/13/2015  . Essential (primary) hypertension 08/28/2015  . Gastro-esophageal reflux disease without esophagitis 08/28/2015  . Combined fat and carbohydrate induced hyperlipemia 08/28/2015  . Malignant neoplasm of breast ([place]) 07/13/2014  . HLD (hyperlipidemia) 05/09/2014  . Inflamed nasal mucosa 05/09/2014  . Acid reflux 04/06/2014  . BP (high blood pressure) 04/06/2014  . Adult hypothyroidism 04/06/2014  . Degenerative arthritis of lumbar spine 04/06/2014    Rosalyn Gess OTR/L,CLT 10/16/2016, 2:08 PM  [place] [facility] [address] [place], [address] Phone: [phone]   Fax:  [phone]  Name: Margaret Townsend MRN: [id number] Date of Birth: 1946/02/05

## 2016-10-16 NOTE — Patient Instructions (Addendum)
Pt fitted with prefab thumb spica - to wear all the time off with ADL"s and 2 x day for pain free AROM of L wrist and thumb  Gentle PROM for elbow extention  All 3 planes - and  AROM into mat and against wall   decrease HEP to 1 lbs for  elbow in all planes 6 reps - each of 3 positions To do at home   discharge or on hold YTB and 2 lbs  Hold off on any weight for wrist  Hold off on putty

## 2016-10-18 ENCOUNTER — Ambulatory Visit: Payer: Commercial Managed Care - HMO | Admitting: Occupational Therapy

## 2016-10-18 DIAGNOSIS — M25632 Stiffness of left wrist, not elsewhere classified: Secondary | ICD-10-CM | POA: Diagnosis not present

## 2016-10-18 DIAGNOSIS — M25532 Pain in left wrist: Secondary | ICD-10-CM | POA: Diagnosis not present

## 2016-10-18 DIAGNOSIS — M25522 Pain in left elbow: Secondary | ICD-10-CM | POA: Diagnosis not present

## 2016-10-18 DIAGNOSIS — M25622 Stiffness of left elbow, not elsewhere classified: Secondary | ICD-10-CM | POA: Diagnosis not present

## 2016-10-18 NOTE — Patient Instructions (Addendum)
Same HEP as last time  

## 2016-10-18 NOTE — Therapy (Signed)
[place] [facility] [address], [place], [address] Phone: [phone]   Fax:  [phone]  Occupational Therapy Treatment  Patient Details  Name: Margaret Townsend MRN: [id number] Date of Birth: 15-Jul-1946 Referring Provider: Collier Bullock  Encounter Date: 10/18/2016      OT End of Session - 10/18/16 1651    Visit Number 13   Number of Visits 16   Date for OT Re-Evaluation 10/18/16   OT Start Time 0928   OT Stop Time 1020   OT Time Calculation (min) 52 min   Activity Tolerance Patient tolerated treatment well   Behavior During Therapy [facility] for tasks assessed/performed      Past Medical History:  Diagnosis Date  . Cancer (HCC)    breast  . Hypertension   . Thyroid disease     Past Surgical History:  Procedure Laterality Date  . ABDOMINAL HYSTERECTOMY    . BREAST SURGERY    . MASTECTOMY      There were no vitals filed for this visit.      Subjective Assessment - 10/18/16 0938    Subjective  (P)  No pain with this splint on - I took if off about 2 x day and do heatingpad - and work my wrist - but keep my thumb still - still feel catch in my thumb- whoozy at times    Patient Stated Goals (P)  Want to get the motion and use of L hand and arm back - to cut hair, put contact lenses, sewing , yard work , dressing ,    Currently in Pain? (P)  Yes   Pain Score (P)  2                       OT Treatments/Exercises (OP) - 10/18/16 0001      Moist Heat Therapy   Number Minutes Moist Heat 10 Minutes   Moist Heat Location Elbow     Ultrasound   Ultrasound Location Radial side of wrist - 1 st dorsal compartment   Ultrasound Parameters 3.3MHZ at 20%; 1.0 intensity , 6 min   Ultrasound Goals Pain     LUE Fluidotherapy   Number Minutes Fluidotherapy 10 Minutes   LUE Fluidotherapy Location Hand;Wrist   Comments At [facility] to do AROM for wrist and thumb pain free       Tenderness less over   distal radius head and Finkelstein slight pull - pain better than last time  Less pain with wrist AROM and thumb flexion and RA - pain with PA - but ? CMC arthritis  Pt to cont with prefab thumb spica - to wear all the time off with ADL"s and 2 x day for pain free AROM of L wrist and thumb  Fuido for wrist and digits AROM  - but pain free range  heatingpad around elbow   Gentle PROM for elbow extention  All 3 planes - then AROM into mat and against wall end range - Place and hold end range for elbow extention 1 min each position UBE 5 min change direction every min for elbow flexion and extention - with thumb spica on               OT Education - 10/18/16 1650    Education provided Yes   Education Details HEP reviewed   Person(s) Educated Patient   Methods Explanation;Demonstration;Tactile cues;Verbal cues   Comprehension Verbal cues required;Returned demonstration;Verbalized  understanding          OT Short Term Goals - 09/06/16 0937      OT SHORT TERM GOAL #1   Title Pt to be ind in HEP to increase AROM in L elbow flexion and extention to put hand in pocket and put contact lenses in    Baseline flexion 140; extention -24    Status Achieved           OT Long Term Goals - 09/06/16 [id number]      OT LONG TERM GOAL #1   Title Upgrade goals to increase strength  in L Elbow to increase functional use of L arm   Baseline only gentle PROM and AROM to elbow   Time 4   Period Weeks   Status On-going     OT LONG TERM GOAL #2   Title Assess wrist AROM when ordered by MD    Baseline Wrist in splint - lunate fx   Time 4   Period Weeks   Status On-going               Plan - 10/18/16 1652    Clinical Impression Statement Pt show progress in pain over 1st dorsal compartment since last time - pt to cont with prefab  thumb spica - and ROM pain free range - and 1 lbs for elbow after end range AROM and PROM    Rehab Potential Good   OT Frequency 2x / week   OT  Duration 4 weeks   OT Treatment/Interventions Self-care/ADL training;Moist Heat;Fluidtherapy;Patient/family education;Splinting;Therapeutic exercises;Contrast Bath;Manual Therapy;Passive range of motion   Plan assess progress and if can wean out of thumb spica   OT Home Exercise Plan see pt instruction    Consulted and Agree with Plan of Care Patient      Patient will benefit from skilled therapeutic intervention in order to improve the following deficits and impairments:  Decreased range of motion, Impaired flexibility, Increased edema, Impaired UE functional use, Pain, Decreased strength  Visit Diagnosis: Pain in left elbow  Pain in left wrist  Stiffness of left elbow, not elsewhere classified  Stiffness of left wrist, not elsewhere classified    Problem List Patient Active Problem List   Diagnosis Date Noted  . Allergic rhinitis, seasonal 11/13/2015  . Avitaminosis D 11/13/2015  . Essential (primary) hypertension 08/28/2015  . Gastro-esophageal reflux disease without esophagitis 08/28/2015  . Combined fat and carbohydrate induced hyperlipemia 08/28/2015  . Malignant neoplasm of breast ([place]) 07/13/2014  . HLD (hyperlipidemia) 05/09/2014  . Inflamed nasal mucosa 05/09/2014  . Acid reflux 04/06/2014  . BP (high blood pressure) 04/06/2014  . Adult hypothyroidism 04/06/2014  . Degenerative arthritis of lumbar spine 04/06/2014    Rosalyn Gess OTR/L;CLT 10/18/2016, 4:55 PM  [place] [facility] [address], [place], [address] Phone: [phone]   Fax:  [phone]  Name: Margaret Townsend MRN: [id number] Date of Birth: 11/16/1946

## 2016-10-23 ENCOUNTER — Ambulatory Visit: Payer: Commercial Managed Care - HMO | Admitting: Occupational Therapy

## 2016-10-23 DIAGNOSIS — M25622 Stiffness of left elbow, not elsewhere classified: Secondary | ICD-10-CM

## 2016-10-23 DIAGNOSIS — M25632 Stiffness of left wrist, not elsewhere classified: Secondary | ICD-10-CM

## 2016-10-23 DIAGNOSIS — M25522 Pain in left elbow: Secondary | ICD-10-CM

## 2016-10-23 DIAGNOSIS — M25532 Pain in left wrist: Secondary | ICD-10-CM

## 2016-10-23 NOTE — Patient Instructions (Addendum)
Pt to cont with prefab thumb spica - to wear all the time off with ADL"s and 2-3  x day for pain free AROM of L wrist and thumb  PROM for wrist extention - prayer stretch to stomach - gentle 10  AROM RD and UD , sup/pro  10 reps no increase pain  Thumb PA and RA , flexion - no pain increase - combination flexion of thumb and UD  10 reps  Each and add to HEP  Isometric strengthening for wrist flexion /ext, RD and UD - in neutral position  10 reps each    PROM for elbow extention All 3 planes - then AROM  against wall end range - 2 lbs for wrist flexion and extention - no pain at wrist and thumb spica on  10 reps  Add to HEP

## 2016-10-23 NOTE — Therapy (Signed)
[place] [facility] [address], [place], [address] Phone: [phone]   Fax:  [phone]  Occupational Therapy Treatment  Patient Details  Name: Margaret Townsend MRN: [id number] Date of Birth: 1946-04-01 Referring Provider: Collier Bullock  Encounter Date: 10/23/2016      OT End of Session - 10/23/16 1136    Visit Number 14   Number of Visits 19   Date for OT Re-Evaluation 11/27/16   OT Start Time 0921   OT Stop Time 1010   OT Time Calculation (min) 49 min   Activity Tolerance Patient tolerated treatment well   Behavior During Therapy [facility] for tasks assessed/performed      Past Medical History:  Diagnosis Date  . Cancer (HCC)    breast  . Hypertension   . Thyroid disease     Past Surgical History:  Procedure Laterality Date  . ABDOMINAL HYSTERECTOMY    . BREAST SURGERY    . MASTECTOMY      There were no vitals filed for this visit.      Subjective Assessment - 10/23/16 0922    Subjective  some good days and bad day - stil feel like neck cause a lot of it - shooting pain sometimes down neck to thumb - using it little more my hands   Patient Stated Goals Want to get the motion and use of L hand and arm back - to cut hair, put contact lenses, sewing , yard work , dressing ,    Currently in Pain? Yes   Pain Score 1    Pain Location Wrist   Pain Orientation Left   Pain Descriptors / Indicators Aching   Pain Type Acute pain            [facility] OT Assessment - 10/23/16 0001      AROM   Left Elbow Flexion 150   Left Elbow Extension -18   Left Wrist Extension 45 Degrees   Left Wrist Flexion 65 Degrees   Left Wrist Radial Deviation 24 Degrees   Left Wrist Ulnar Deviation 30 Degrees                  OT Treatments/Exercises (OP) - 10/23/16 0001      Moist Heat Therapy   Number Minutes Moist Heat 10 Minutes   Moist Heat Location Elbow     LUE Fluidotherapy   Number Minutes  Fluidotherapy 10 Minutes   LUE Fluidotherapy Location Hand;Wrist   Comments AT [facility] to increase ROM and decrease pain at wrist and thumb        Tenderness less over  distal radius head and Finkelstein slight pull - pain better than last time  Less pain with wrist AROM  Except wrist extention - but was  Pt to cont with prefab thumb spica - to wear all the time off with ADL"s and 2-3  x day for pain free AROM of L wrist and thumb  Fuido for wrist and digits AROM  - but pain free range  heatingpad around elbow   PROM for wrist extention - prayer stretch to stomach - genlte 10  AROM RD and UD , sup/pro  10 reps no increase pain  Thumb PA and RA , flexion - no pain increase - combination flexion of thumb and UD  10 reps  Each and add to HEP  Isometric strengthening for wrist flexion /ext, RD and UD - in neutral position  10 reps each  PROM for elbow extention All 3 planes - then AROM  against wall end range - 2 lbs for wrist flexion and extention - no pain at wrist and thumb spica on  10 reps  Add to HEP               OT Education - 10/23/16 1136    Education provided Yes   Education Details HEP upgraded   Person(s) Educated Patient   Methods Explanation;Demonstration;Verbal cues;Handout;Tactile cues   Comprehension Returned demonstration;Verbalized understanding;Verbal cues required          OT Short Term Goals - 10/23/16 1139      OT SHORT TERM GOAL #1   Title Pt to be ind in HEP to increase AROM in L elbow flexion and extention to put hand in pocket and put contact lenses in    Status Achieved           OT Long Term Goals - 10/23/16 1139      OT LONG TERM GOAL #1   Title Upgrade goals to increase strength  in L Elbow to increase functional use of L arm   Status Achieved     OT LONG TERM GOAL #2   Title Assess wrist AROM when ordered by MD    Status Achieved     OT LONG TERM GOAL #3   Title L grip strength improve to 50% compare to R hand  to carry groceries without increase pain    Baseline NT because of increase wrist pain    Time 4   Period Weeks   Status New     OT LONG TERM GOAL #4   Title Wrist AROM on L improve to WNL to bath and dress with out increase pain or symptoms at wrist    Baseline Wrist extention 45 and pain increase -    Time 3   Period Weeks   Status New     OT LONG TERM GOAL #5   Title L elbow strength increase for pt to push up from chair and carry 5 lbs    Baseline can do 2lbs but wrist pain - and thumb spica on    Time 3   Period Weeks   Status New               Plan - 10/23/16 1137    Clinical Impression Statement Pt still to wear thumb spica - but CMC or dequervain pain improving - able to do more AROM and add PROM for wrist extention this date - as well as isometric for wrist - able to tolerate 2 lbs with splint on for elbow HEP - pt to see MD next week - cont 1 x wk for 5 wks  to increase ROM and strength - wean out of wrist thumb spica    Rehab Potential Good   OT Frequency 1x / week   OT Duration 4 weeks   OT Treatment/Interventions Self-care/ADL training;Moist Heat;Fluidtherapy;Patient/family education;Splinting;Therapeutic exercises;Contrast Bath;Manual Therapy;Passive range of motion   Plan Results from MD appt    OT Home Exercise Plan see pt instruction    Consulted and Agree with Plan of Care Patient      Patient will benefit from skilled therapeutic intervention in order to improve the following deficits and impairments:  Decreased range of motion, Impaired flexibility, Increased edema, Impaired UE functional use, Pain, Decreased strength  Visit Diagnosis: Pain in left elbow  Pain in left wrist  Stiffness of left elbow, not elsewhere classified  Stiffness of left wrist, not elsewhere classified    Problem List Patient Active Problem List   Diagnosis Date Noted  . Allergic rhinitis, seasonal 11/13/2015  . Avitaminosis D 11/13/2015  . Essential (primary)  hypertension 08/28/2015  . Gastro-esophageal reflux disease without esophagitis 08/28/2015  . Combined fat and carbohydrate induced hyperlipemia 08/28/2015  . Malignant neoplasm of breast ([place]) 07/13/2014  . HLD (hyperlipidemia) 05/09/2014  . Inflamed nasal mucosa 05/09/2014  . Acid reflux 04/06/2014  . BP (high blood pressure) 04/06/2014  . Adult hypothyroidism 04/06/2014  . Degenerative arthritis of lumbar spine 04/06/2014    Rosalyn Gess OTR/L,CLT 10/23/2016, 11:43 AM  [place] [facility] [address] [place], [address] Phone: [phone]   Fax:  [phone]  Name: THEMBI MCFARLAIN MRN: [id number] Date of Birth: 1946/11/30

## 2016-10-23 NOTE — Addendum Note (Signed)
Addended by: Rosalyn Gess on: 10/23/2016 11:46 AM   Modules accepted: Orders

## 2016-10-29 DIAGNOSIS — M5412 Radiculopathy, cervical region: Secondary | ICD-10-CM | POA: Diagnosis not present

## 2016-10-29 DIAGNOSIS — M7989 Other specified soft tissue disorders: Secondary | ICD-10-CM | POA: Diagnosis not present

## 2016-10-29 DIAGNOSIS — F0781 Postconcussional syndrome: Secondary | ICD-10-CM | POA: Diagnosis not present

## 2016-10-29 DIAGNOSIS — M25532 Pain in left wrist: Secondary | ICD-10-CM | POA: Diagnosis not present

## 2016-10-29 DIAGNOSIS — S6992XA Unspecified injury of left wrist, hand and finger(s), initial encounter: Secondary | ICD-10-CM | POA: Diagnosis not present

## 2016-10-30 ENCOUNTER — Other Ambulatory Visit: Payer: Self-pay | Admitting: Family

## 2016-10-30 DIAGNOSIS — M25532 Pain in left wrist: Secondary | ICD-10-CM

## 2016-10-30 DIAGNOSIS — M5412 Radiculopathy, cervical region: Secondary | ICD-10-CM

## 2016-10-31 ENCOUNTER — Ambulatory Visit: Payer: Commercial Managed Care - HMO | Admitting: Occupational Therapy

## 2016-11-01 ENCOUNTER — Ambulatory Visit
Admission: RE | Admit: 2016-11-01 | Discharge: 2016-11-01 | Disposition: A | Discharge status: Home / Self Care | Payer: Commercial Managed Care - HMO | Source: Ambulatory Visit | Attending: Family | Admitting: Family

## 2016-11-01 DIAGNOSIS — M5412 Radiculopathy, cervical region: Secondary | ICD-10-CM | POA: Diagnosis not present

## 2016-11-01 DIAGNOSIS — M50323 Other cervical disc degeneration at C6-C7 level: Secondary | ICD-10-CM | POA: Insufficient documentation

## 2016-11-01 DIAGNOSIS — M4802 Spinal stenosis, cervical region: Secondary | ICD-10-CM | POA: Diagnosis not present

## 2016-11-04 DIAGNOSIS — M5412 Radiculopathy, cervical region: Secondary | ICD-10-CM | POA: Diagnosis not present

## 2016-11-08 ENCOUNTER — Ambulatory Visit
Admission: RE | Admit: 2016-11-08 | Discharge: 2016-11-08 | Disposition: A | Discharge status: Home / Self Care | Payer: Commercial Managed Care - HMO | Source: Ambulatory Visit | Attending: Family | Admitting: Family

## 2016-11-08 DIAGNOSIS — M25532 Pain in left wrist: Secondary | ICD-10-CM | POA: Insufficient documentation

## 2016-11-12 ENCOUNTER — Ambulatory Visit: Payer: Commercial Managed Care - HMO | Attending: Family

## 2016-11-12 DIAGNOSIS — M5412 Radiculopathy, cervical region: Secondary | ICD-10-CM | POA: Diagnosis not present

## 2016-11-12 DIAGNOSIS — R208 Other disturbances of skin sensation: Secondary | ICD-10-CM | POA: Insufficient documentation

## 2016-11-12 DIAGNOSIS — M542 Cervicalgia: Secondary | ICD-10-CM | POA: Diagnosis not present

## 2016-11-12 NOTE — Patient Instructions (Signed)
Neck: Retraction    Sit with back and head straight or lay down flat with head on pillow. Pull chin back to line up ear with shoulder. Do not turn or tilt head. Hold _3___ seconds. Repeat _10___ times. Relax and then repeat for another 10 repetitions.  Do _4___ sessions per day. CAUTION: Movement should be gentle, steady and slow.

## 2016-11-13 DIAGNOSIS — M5412 Radiculopathy, cervical region: Secondary | ICD-10-CM | POA: Diagnosis not present

## 2016-11-13 NOTE — Therapy (Signed)
[facility] [address], [place], [address] Phone: [phone]   Fax:  [phone]  Physical Therapy Evaluation  Patient Details  Name: Margaret Townsend MRN: [id number] Date of Birth: 05-22-1946 Referring Provider: Collier Bullock  Encounter Date: 11/12/2016      PT End of Session - 11/12/16 1026    Visit Number 1   Number of Visits 17   Date for PT Re-Evaluation Feb 06, 2017   Authorization Type g codes 1/10   PT Start Time 1000   PT Stop Time 1100   PT Time Calculation (min) 60 min   Activity Tolerance Patient tolerated treatment well   Behavior During Therapy [facility] for tasks assessed/performed      Past Medical History:  Diagnosis Date  . Cancer (HCC)    breast  . Hypertension   . Thyroid disease     Past Surgical History:  Procedure Laterality Date  . ABDOMINAL HYSTERECTOMY    . BREAST SURGERY    . MASTECTOMY      There were no vitals filed for this visit.       Subjective Assessment - 11/12/16 1022    Subjective Neck pain   Pertinent History Pt reports that she feel of the side of her steps in the garage on 08/06/16. She struck her head and L elbow on shelves next to the step. She suffered a L elbow dislocation when she fell and they performed a closed reduction. She also suffered a lunate fracture and was subsequently immobilized. She was seeing OT for her L elbow but was limited due to pulling sensation in her L neck and pain in her wrist with all resisted exercises. Following the fall pt immediately started to have numbness/tingling in left hand in her first and second digits both dorsal and palmar. She also started having neck pain from the left side of her neck down into her L shoulder and down her left arm. She also reports worsening dizziness since the concussion and she is actually more concerned about this at the moment. She reports some increase nausea with the nausea due to her neck pain.  Pt has a history of R sided breast cancer with lymph node removal 2015. L side was never involved.      Diagnostic tests L hand MRI to assess lunate fracture; MRI brain due to high BP: no acute changes per patient; MRI: mild central canal stenosis C4-C7, severe C5-C7 L foraminal stenosis, Carotid U/S: clear on the R, <40% on the left per patient report, NCV: will be performed 11/13/16   Currently in Pain? Yes   Pain Score 2   Worst: 5/10; Best: 0/10   Pain Location Neck   Pain Orientation Left  Pt has some R neck pain as well   Pain Descriptors / Indicators Other (Comment)  "Pinching   Pain Type Chronic pain  Since September 2017   Pain Radiating Towards L shoulder and down LUE   Pain Onset More than a month ago   Pain Frequency Intermittent   Aggravating Factors  Pt unsure   Pain Relieving Factors rest, heat   Multiple Pain Sites No            [facility] PT Assessment - 11/12/16 1028      Assessment   Medical Diagnosis Cervical radiculopathy   Referring Provider Collier Bullock   Onset Date/Surgical Date 08/06/16   Hand Dominance Right   Next MD Visit NCV test tomorrow  Prior Therapy History of OT for L elbow, remote history of chiropractic adjustments     Precautions   Precautions None     Restrictions   Weight Bearing Restrictions No     Balance Screen   Has the patient fallen in the past 6 months Yes   How many times? 1   Has the patient had a decrease in activity level because of a fear of falling?  Yes   Is the patient reluctant to leave their home because of a fear of falling?  Yes     [place] Private residence   Living Arrangements Spouse/significant other   Available Help at Discharge Family   Type of [place] to enter   Entrance Stairs-Number of Steps 2   [place] One level     Prior Function   Level of Independence Independent   Vocation Part time employment    Dietitian   Leisure likes to sew, yard work ,  work part time Proofreader   Overall Cognitive Status Within Functional Limits for tasks assessed     Observation/Other Assessments   Other Surveys  Other Surveys   Neck Disability Index  68.2%     Sensation   Additional Comments Diminished sensation C6-C7 LUE. Otherwise sensation symmetrical C2-T1     Posture/Postural Control   Posture Comments Forward head and rounded shoulder in sitting position     ROM / Strength   AROM / PROM / Strength Strength;AROM     AROM   Overall AROM Comments Positive spurling L side with reproduction of pain into L shoulder. No change in symptoms with repeated protraction. Centralization with repeated retraction. Positive cervical distraction with decrease in L shoulder pain but some mild increase in neck pain. Pt would not tolerate CPA testing of cervical spine due to increase in pain with palpation along spinouse processes of cervical spine   AROM Assessment Site --   Cervical Flexion 30, reproduction of pain down to L shoulder   Cervical Extension 55, painless active, pain with OP into R side of neck   Cervical - Right Side Bend 35, L sided neck stretch active and with OP   Cervical - Left Side Bend 30, L neck pain radiating into L shoulder   Cervical - Right Rotation 60, painless, 70 with OP painless   Cervical - Left Rotation 55, pain in L neck and into L shoulder actively, 65 degrees and worsening pain with OP     Strength   Overall Strength Comments UE strength appears 4 to 4+/5 throughout with the exception of decreased L grip strength. Mytomes intact C2-T1 except C8 diminished with extremely weak grip strength on L side. Cervical isometrics are strong and painless except painful resisted cervical extension     Palpation   Palpation comment Tender to palpation along cervical spine but no radiating pain. Pain with palpation along L upper trap with pain that  radiates into L shoulder and L upper arm.     Ambulation/Gait   Gait Comments Deferred         TREATMENT  THER-EX Performed supine repeated retractions x 10 into pillow, Handout provided with extensive education about proper performance. Pt also educated about proper sitting posture with neutral cervical spine.                   PT  Education - 11/12/16 1026    Education provided Yes   Education Details Plan of care and HEP   Person(s) Educated Patient   Methods Explanation;Demonstration;Verbal cues;Handout;Tactile cues   Comprehension Verbalized understanding;Returned demonstration             PT Long Term Goals - 11/13/16 1353      PT LONG TERM GOAL #1   Title Pt will be independent with HEP in order to improve strength and balance in order to decrease fall risk and improve function at home and work.   Time 8   Period Weeks   Status New     PT LONG TERM GOAL #2   Title Pt will report worst pain on NPRS as 3/10 in order to demonstrate clinically significant reduction in neck pain   Baseline 11/12/16: worst: 5/10   Time 8   Period Weeks   Status New     PT LONG TERM GOAL #3   Title Pt will decrease NDI by at least 8.5 points in order to demonstrate clinically significant reduction in disability related to neck pain   Baseline 11/12/16: 68.2%   Time 8   Period Weeks   Status New               Plan - 11/13/16 1344    Clinical Impression Statement Pt is a pleasant 70 year-old female referred for cervical radiculopathy. Pt reports bilateral neck pain but worse on the L side. Pain radiates into her L shoulder and down her L arm. Pt also reports numbness/tingling into the first and second digits of her L hand. Pt reports history of L elbow dislocation as well as L lunate fracture. Pt is unsure if anyone has ruled out median/ulnar nerve damage secondary to elbow dislocation. Pt reports she has a NCV tomorrow and that neurologist believes that her  symptoms are due to cervical nerve compression. Pt has a history of MRI showing both central and foraminal stenosis. PT examination reveals positive Spurling's for reproduction of neck and shoulder pain. Pain decreases and centralizes with both cervical distraction and repeated retraction. Pt with limited cervical AROM but most notably positive reproduction of pain with cervical flexion, L lateral flexion, and L rotation. Of note pt reports increasing dizziness since her fall with concurrent concussion. She is very concerned regarding this. Pt appears to have some post-concussive dizziness based on limited exam today and would benefit from referral for vestibular physical therapy once her neck pain has improved. Pt will benefit from skilled PT services to address deficits in neck pain and LUE radicular symptoms.    Rehab Potential Fair   Clinical Impairments Affecting Rehab Potential Positive: motivation, age; Negative: distal symptoms, chronicity   PT Frequency 2x / week   PT Duration 8 weeks   PT Treatment/Interventions ADLs/Self Care Home Management;Aquatic Therapy;Biofeedback;Canalith Repostioning;Cryotherapy;Electrical Stimulation;Iontophoresis 4mg /ml Dexamethasone;Moist Heat;Traction;Ultrasound;DME Instruction;Gait training;Stair training;Functional mobility training;Therapeutic activities;Therapeutic exercise;Balance training;Neuromuscular re-education;Patient/family education;Manual techniques;Passive range of motion;Dry needling;Vestibular   PT Next Visit Plan progress cervical retraction, manual technique for pain and ROM, progression to DNF strengthening   PT Home Exercise Plan repeated cervical retractions 2 x 10, 4 times/day   Consulted and Agree with Plan of Care Patient      Patient will benefit from skilled therapeutic intervention in order to improve the following deficits and impairments:  Pain, Decreased range of motion, Decreased strength  Visit Diagnosis: Cervicalgia - Plan: PT  plan of care cert/re-cert  Radiculopathy, cervical region - Plan: PT plan of care  cert/re-cert  Other disturbances of skin sensation - Plan: PT plan of care cert/re-cert      G-Codes - [id number]    Functional Assessment Tool Used clinical judgement, NDI   Functional Limitation Changing and maintaining body position   Changing and Maintaining Body Position Current Status [id number]) At least 60 percent but less than 80 percent impaired, limited or restricted   Changing and Maintaining Body Position Goal Status [id number]) At least 20 percent but less than 40 percent impaired, limited or restricted       Problem List Patient Active Problem List   Diagnosis Date Noted  . Allergic rhinitis, seasonal 11/13/2015  . Avitaminosis D 11/13/2015  . Essential (primary) hypertension 08/28/2015  . Gastro-esophageal reflux disease without esophagitis 08/28/2015  . Combined fat and carbohydrate induced hyperlipemia 08/28/2015  . Malignant neoplasm of breast ([place]) 07/13/2014  . HLD (hyperlipidemia) 05/09/2014  . Inflamed nasal mucosa 05/09/2014  . Acid reflux 04/06/2014  . BP (high blood pressure) 04/06/2014  . Adult hypothyroidism 04/06/2014  . Degenerative arthritis of lumbar spine 04/06/2014   Phillips Grout PT, DPT   Kallum Jorgensen 11/13/2016, 5:28 PM  [facility] [address], [place], [address] Phone: [phone]   Fax:  [phone]  Name: Margaret Townsend MRN: [id number] Date of Birth: 1946/02/22

## 2016-11-18 ENCOUNTER — Ambulatory Visit: Payer: Commercial Managed Care - HMO | Admitting: Physical Therapy

## 2016-11-18 ENCOUNTER — Encounter: Payer: Self-pay | Admitting: Physical Therapy

## 2016-11-18 DIAGNOSIS — M5412 Radiculopathy, cervical region: Secondary | ICD-10-CM | POA: Diagnosis not present

## 2016-11-18 DIAGNOSIS — R208 Other disturbances of skin sensation: Secondary | ICD-10-CM

## 2016-11-18 DIAGNOSIS — M542 Cervicalgia: Secondary | ICD-10-CM | POA: Diagnosis not present

## 2016-11-18 NOTE — Therapy (Signed)
[facility] [address] [place], [address] Phone: [phone]   Fax:  [phone]  Physical Therapy Treatment  Patient Details  Name: Margaret Townsend MRN: [id number] Date of Birth: 02-Mar-1946 Referring Provider: Collier Bullock  Encounter Date: 11/18/2016      PT End of Session - 11/18/16 1026    Visit Number 2   Number of Visits 17   Date for PT Re-Evaluation 2017/01/08   Authorization Type g codes 12-Jan-2023   PT Start Time 1016   PT Stop Time 1104   PT Time Calculation (min) 48 min   Activity Tolerance Patient tolerated treatment well   Behavior During Therapy [facility] for tasks assessed/performed      Past Medical History:  Diagnosis Date  . Cancer (HCC)    breast  . Hypertension   . Thyroid disease     Past Surgical History:  Procedure Laterality Date  . ABDOMINAL HYSTERECTOMY    . BREAST SURGERY    . MASTECTOMY      There were no vitals filed for this visit.      Subjective Assessment - 11/18/16 1020    Subjective Pt report most of her pain seems to be over base of R neck which is mainly sore but her pain is radiating from her L neck down to fingers.  She is also having some pain under her L shoulder blade.  Pt is unable to pick up objects or open a bottle.  Pt believes her dizziness has improved as she is able to do "just about anything" (except driving) with constant mild dizziness.  Pt reports she is completing her HEP 4-6x/day with no complaints or concerns.  Pt had NCV test done to determine if her N/T in her hand was due to her elbow injury. Pet pt the MD excluded this possibility and MD believes it is originating from her cervical spine.    Pertinent History Pt reports that she feel of the side of her steps in the garage on 08/06/16. She struck her head and L elbow on shelves next to the step. She suffered a L elbow dislocation when she fell and they performed a closed reduction. She also suffered  a lunate fracture and was subsequently immobilized. She was seeing OT for her L elbow but was limited due to pulling sensation in her L neck and pain in her wrist with all resisted exercises. Following the fall pt immediately started to have numbness/tingling in left hand in her first and second digits both dorsal and palmar. She also started having neck pain from the left side of her neck down into her L shoulder and down her left arm. She also reports worsening dizziness since the concussion and she is actually more concerned about this at the moment. She reports some increase nausea with the nausea due to her neck pain. Pt has a history of R sided breast cancer with lymph node removal 2015. L side was never involved.      Diagnostic tests L hand MRI to assess lunate fracture; MRI brain due to high BP: no acute changes per patient; MRI: mild central canal stenosis C4-C7, severe C5-C7 L foraminal stenosis, Carotid U/S: clear on the R, <40% on the left per patient report, NCV: will be performed 11/13/16   Currently in Pain? Yes   Pain Score 2    Pain Location Neck   Pain Orientation Left   Pain Descriptors / Indicators Other (Comment)  pinching; stinging   Pain Type Chronic pain   Pain Onset More than a month ago        TREATMENT   Therapeutic Exercise:  Seated cervical retraction 2x10 with 3 second holds. Cues to continue breathing throughout exercise as she has the tendency to hold her breath into retraction. Pt reports pain in L scapular region when she performs this combined with scapular retraction which appears to be due to pt's activation of UT with scapular retraction. Pt instructed to relax shoulders during cervical retraction and pt denied pain with this.  Seated scapular retractions with verbal and tactile cues for scapular squeeze. Pt denies pain with this. 1x10 with 3 second holds.   Manual Therapy:  Cervical distraction 4x30 seconds. Pain 3/10 prior to distraction. Pain 0/10 but  numbness in L hand.  Ischemic compression R suboccipital muscles where pt reports soreness. 3x30 seconds in supine.  L UT contract relax in supine 2x5. Pt reports onset of L hand numbness with activation of L UT during contraction and relief with stretch.  Cervical L rotation AROM 56 deg with pain R neck prior to MWM. L cervical rotation MWM 2x10. Cervical L rotation AROM 64 deg painfree following MWM.                PT Education - 11/18/16 1026    Education provided Yes   Education Details Exercise technique   Person(s) Educated Patient   Methods Explanation;Demonstration;Verbal cues;Tactile cues   Comprehension Verbalized understanding;Returned demonstration;Need further instruction             PT Long Term Goals - 11/13/16 1353      PT LONG TERM GOAL #1   Title Pt will be independent with HEP in order to improve strength and balance in order to decrease fall risk and improve function at home and work.   Time 8   Period Weeks   Status New     PT LONG TERM GOAL #2   Title Pt will report worst pain on NPRS as 3/10 in order to demonstrate clinically significant reduction in neck pain   Baseline 11/12/16: worst: 5/10   Time 8   Period Weeks   Status New     PT LONG TERM GOAL #3   Title Pt will decrease NDI by at least 8.5 points in order to demonstrate clinically significant reduction in disability related to neck pain   Baseline 11/12/16: 68.2%   Time 8   Period Weeks   Status New               Plan - 11/18/16 1059    Clinical Impression Statement Pt presents with poor postural and neuromuscular control of cervical and scapular region and demonstrates compensation from L UT with scapular squeezes and cervical retractions.  Provided pt with verbal and tactile cues with decreased pain and improved mechanics with these exercises.  Pt had relief with L cervical rotation MWM indicating involvement of joint restriction.  Pt encouraged to complete HEP as  prescribed keeping shoulders relaxed throughout.  Pt is responding well to therapeutic interventions and will benefit from continued skilled PT interventions for improved QOL, decreased pain, and improved ROM.   Rehab Potential Fair   Clinical Impairments Affecting Rehab Potential Positive: motivation, age; Negative: distal symptoms, chronicity   PT Frequency 2x / week   PT Duration 8 weeks   PT Treatment/Interventions ADLs/Self Care Home Management;Aquatic Therapy;Biofeedback;Canalith Repostioning;Cryotherapy;Electrical Stimulation;Iontophoresis 4mg /ml Dexamethasone;Moist Heat;Traction;Ultrasound;DME Instruction;Gait training;Stair training;Functional mobility training;Therapeutic activities;Therapeutic exercise;Balance  training;Neuromuscular re-education;Patient/family education;Manual techniques;Passive range of motion;Dry needling;Vestibular   PT Next Visit Plan progress cervical retraction, manual technique for pain and ROM, progression to DNF strengthening   PT Home Exercise Plan repeated cervical retractions 2 x 10, 4 times/day   Consulted and Agree with Plan of Care Patient      Patient will benefit from skilled therapeutic intervention in order to improve the following deficits and impairments:  Pain, Decreased range of motion, Decreased strength  Visit Diagnosis: Cervicalgia  Radiculopathy, cervical region  Other disturbances of skin sensation     Problem List Patient Active Problem List   Diagnosis Date Noted  . Allergic rhinitis, seasonal 11/13/2015  . Avitaminosis D 11/13/2015  . Essential (primary) hypertension 08/28/2015  . Gastro-esophageal reflux disease without esophagitis 08/28/2015  . Combined fat and carbohydrate induced hyperlipemia 08/28/2015  . Malignant neoplasm of breast ([place]) 07/13/2014  . HLD (hyperlipidemia) 05/09/2014  . Inflamed nasal mucosa 05/09/2014  . Acid reflux 04/06/2014  . BP (high blood pressure) 04/06/2014  . Adult hypothyroidism  04/06/2014  . Degenerative arthritis of lumbar spine 04/06/2014    Collie Siad PT, DPT 11/18/2016, 12:30 PM  [facility] [address] [place], [address] Phone: [phone]   Fax:  [phone]  Name: LUCILLE BRANDES MRN: [id number] Date of Birth: [birth year]

## 2016-11-19 DIAGNOSIS — M4722 Other spondylosis with radiculopathy, cervical region: Secondary | ICD-10-CM | POA: Diagnosis not present

## 2016-11-19 DIAGNOSIS — M5412 Radiculopathy, cervical region: Secondary | ICD-10-CM | POA: Diagnosis not present

## 2016-11-21 ENCOUNTER — Ambulatory Visit: Payer: Commercial Managed Care - HMO | Admitting: Physical Therapy

## 2016-11-21 DIAGNOSIS — R208 Other disturbances of skin sensation: Secondary | ICD-10-CM | POA: Diagnosis not present

## 2016-11-21 DIAGNOSIS — M5412 Radiculopathy, cervical region: Secondary | ICD-10-CM

## 2016-11-21 DIAGNOSIS — M542 Cervicalgia: Secondary | ICD-10-CM | POA: Diagnosis not present

## 2016-11-21 NOTE — Therapy (Signed)
[facility] [address], [place], [address] Phone: [phone]   Fax:  [phone]  Physical Therapy Treatment  Patient Details  Name: Margaret Townsend MRN: [id number] Date of Birth: 1946-01-21 Referring Provider: Collier Bullock  Encounter Date: 11/21/2016      PT End of Session - 11/21/16 1107    Visit Number 3   Number of Visits 17   Date for PT Re-Evaluation 30-Jan-2017   Authorization Type g codes 02/03/2023   PT Start Time 0916   PT Stop Time 1000   PT Time Calculation (min) 44 min   Activity Tolerance Patient tolerated treatment well   Behavior During Therapy [facility] for tasks assessed/performed      Past Medical History:  Diagnosis Date  . Cancer (HCC)    breast  . Hypertension   . Thyroid disease     Past Surgical History:  Procedure Laterality Date  . ABDOMINAL HYSTERECTOMY    . BREAST SURGERY    . MASTECTOMY      There were no vitals filed for this visit.      Subjective Assessment - 11/21/16 0921    Subjective Pt reports her neck pain is improved, however she is still feeling high level of dizziness.    Pertinent History Pt reports that she feel of the side of her steps in the garage on 08/06/16. She struck her head and L elbow on shelves next to the step. She suffered a L elbow dislocation when she fell and they performed a closed reduction. She also suffered a lunate fracture and was subsequently immobilized. She was seeing OT for her L elbow but was limited due to pulling sensation in her L neck and pain in her wrist with all resisted exercises. Following the fall pt immediately started to have numbness/tingling in left hand in her first and second digits both dorsal and palmar. She also started having neck pain from the left side of her neck down into her L shoulder and down her left arm. She also reports worsening dizziness since the concussion and she is actually more concerned about this at  the moment. She reports some increase nausea with the nausea due to her neck pain. Pt has a history of R sided breast cancer with lymph node removal 03/24/2014. L side was never involved.      Diagnostic tests L hand MRI to assess lunate fracture; MRI brain due to high BP: no acute changes per patient; MRI: mild central canal stenosis C4-C7, severe C5-C7 L foraminal stenosis, Carotid U/S: clear on the R, <40% on the left per patient report, NCV: will be performed 11/13/16   Currently in Pain? Yes   Pain Score 2    Pain Orientation Left   Pain Onset More than a month ago              Objective: Prone CPAs grade I-III 3x1 min T2-T5. B UPAs in same region same grade.  Trigger point dry needling - pt informed and consented to tx, informed regarding risks. No charge. Performed on suboccipital musculature on R and R UT.  Following this pt reported abolishment of pain and improvement in ROM and dizziness.  Seated rotation for cervical spine, x10 repeated throughout session and issued as HEP.  Deep breathing with cuing for improvement in diaphragm activation/decr. Use of accessory musculature.  PT Long Term Goals - 11/13/16 1353      PT LONG TERM GOAL #1   Title Pt will be independent with HEP in order to improve strength and balance in order to decrease fall risk and improve function at home and work.   Time 8   Period Weeks   Status New     PT LONG TERM GOAL #2   Title Pt will report worst pain on NPRS as 3/10 in order to demonstrate clinically significant reduction in neck pain   Baseline 11/12/16: worst: 5/10   Time 8   Period Weeks   Status New     PT LONG TERM GOAL #3   Title Pt will decrease NDI by at least 8.5 points in order to demonstrate clinically significant reduction in disability related to neck pain   Baseline 11/12/16: 68.2%   Time 8   Period Weeks   Status New               Plan - 11/21/16 1108    Clinical  Impression Statement Pt made significant improvement within session with ROM, pain and dizziness with thoracic mobilization, dry needling. Will continue to work on this at next session, encouraged pt to perform cervical rotation as focal release prior to return for additional PT   Rehab Potential Fair   Clinical Impairments Affecting Rehab Potential Positive: motivation, age; Negative: distal symptoms, chronicity   PT Frequency 2x / week   PT Duration 8 weeks   PT Treatment/Interventions ADLs/Self Care Home Management;Aquatic Therapy;Biofeedback;Canalith Repostioning;Cryotherapy;Electrical Stimulation;Iontophoresis 4mg /ml Dexamethasone;Moist Heat;Traction;Ultrasound;DME Instruction;Gait training;Stair training;Functional mobility training;Therapeutic activities;Therapeutic exercise;Balance training;Neuromuscular re-education;Patient/family education;Manual techniques;Passive range of motion;Dry needling;Vestibular   PT Next Visit Plan progress cervical retraction, manual technique for pain and ROM, progression to DNF strengthening   PT Home Exercise Plan repeated cervical retractions 2 x 10, 4 times/day   Consulted and Agree with Plan of Care Patient      Patient will benefit from skilled therapeutic intervention in order to improve the following deficits and impairments:  Pain, Decreased range of motion, Decreased strength  Visit Diagnosis: Cervicalgia  Radiculopathy, cervical region     Problem List Patient Active Problem List   Diagnosis Date Noted  . Allergic rhinitis, seasonal 11/13/2015  . Avitaminosis D 11/13/2015  . Essential (primary) hypertension 08/28/2015  . Gastro-esophageal reflux disease without esophagitis 08/28/2015  . Combined fat and carbohydrate induced hyperlipemia 08/28/2015  . Malignant neoplasm of breast ([place]) 07/13/2014  . HLD (hyperlipidemia) 05/09/2014  . Inflamed nasal mucosa 05/09/2014  . Acid reflux 04/06/2014  . BP (high blood pressure) 04/06/2014  .  Adult hypothyroidism 04/06/2014  . Degenerative arthritis of lumbar spine 04/06/2014    Fisher,Benjamin PT DPT 11/21/2016, 11:17 AM  [facility] [address], [place], [address] Phone: [phone]   Fax:  [phone]  Name: Margaret Townsend MRN: [id number] Date of Birth: August 12, 1946

## 2016-11-28 ENCOUNTER — Encounter: Payer: Commercial Managed Care - HMO | Admitting: Physical Therapy

## 2016-11-29 ENCOUNTER — Ambulatory Visit: Payer: Commercial Managed Care - HMO | Admitting: Physical Therapy

## 2016-11-29 DIAGNOSIS — M542 Cervicalgia: Secondary | ICD-10-CM | POA: Diagnosis not present

## 2016-11-29 DIAGNOSIS — R208 Other disturbances of skin sensation: Secondary | ICD-10-CM | POA: Diagnosis not present

## 2016-11-29 DIAGNOSIS — M5412 Radiculopathy, cervical region: Secondary | ICD-10-CM | POA: Diagnosis not present

## 2016-11-29 NOTE — Therapy (Signed)
[facility] March 11, [address] [place], [address] Phone: [phone]   Fax:  [phone]  Physical Therapy Treatment  Patient Details  Name: Margaret Townsend MRN: [id number] Date of Birth: 1946/07/28 Referring Provider: Collier Bullock  Encounter Date: 11/29/2016      PT End of Session - 11/29/16 1138    Visit Number 4   Number of Visits 17   Date for PT Re-Evaluation 01/17/2017   Authorization Type g codes 2023/01/21   PT Start Time 03-11-21   PT Stop Time 03-11-01   PT Time Calculation (min) 40 min   Activity Tolerance Patient tolerated treatment well   Behavior During Therapy [facility] for tasks assessed/performed      Past Medical History:  Diagnosis Date  . Cancer (HCC)    breast  . Hypertension   . Thyroid disease     Past Surgical History:  Procedure Laterality Date  . ABDOMINAL HYSTERECTOMY    . BREAST SURGERY    . MASTECTOMY      There were no vitals filed for this visit.      Subjective Assessment - 11/29/16 1127    Subjective Pt reports continued L sided pain, however balance has significantly improved.    Pertinent History Pt reports that she feel of the side of her steps in the garage on 08/06/16. She struck her head and L elbow on shelves next to the step. She suffered a L elbow dislocation when she fell and they performed a closed reduction. She also suffered a lunate fracture and was subsequently immobilized. She was seeing OT for her L elbow but was limited due to pulling sensation in her L neck and pain in her wrist with all resisted exercises. Following the fall pt immediately started to have numbness/tingling in left hand in her first and second digits both dorsal and palmar. She also started having neck pain from the left side of her neck down into her L shoulder and down her left arm. She also reports worsening dizziness since the concussion and she is actually more concerned about this at the moment. She  reports some increase nausea with the nausea due to her neck pain. Pt has a history of R sided breast cancer with lymph node removal 2014-03-11. L side was never involved.      Diagnostic tests L hand MRI to assess lunate fracture; MRI brain due to high BP: no acute changes per patient; MRI: mild central canal stenosis C4-C7, severe C5-C7 L foraminal stenosis, Carotid U/S: clear on the R, <40% on the left per patient report, NCV: will be performed 11/13/16   Currently in Pain? Yes   Pain Score 1    Pain Location Neck   Pain Onset More than a month ago                                      PT Long Term Goals - 11/13/16 1353      PT LONG TERM GOAL #1   Title Pt will be independent with HEP in order to improve strength and balance in order to decrease fall risk and improve function at home and work.   Time 8   Period Weeks   Status New     PT LONG TERM GOAL #2   Title Pt will report worst pain on NPRS as 3/10 in order to demonstrate  clinically significant reduction in neck pain   Baseline 11/12/16: worst: 5/10   Time 8   Period Weeks   Status New     PT LONG TERM GOAL #3   Title Pt will decrease NDI by at least 8.5 points in order to demonstrate clinically significant reduction in disability related to neck pain   Baseline 11/12/16: 68.2%   Time 8   Period Weeks   Status New               Plan - 11/29/16 1205    Clinical Impression Statement Pt appears to be making improvement in diziness, pain and UE function. Continues to c/o difficulty with use of L hand. Reported some lightheadedness following trigger point dry needling which resolved quickly upon sitting upright.   Rehab Potential Fair   Clinical Impairments Affecting Rehab Potential Positive: motivation, age; Negative: distal symptoms, chronicity   PT Frequency 2x / week   PT Duration 8 weeks   PT Treatment/Interventions ADLs/Self Care Home Management;Aquatic Therapy;Biofeedback;Canalith  Repostioning;Cryotherapy;Electrical Stimulation;Iontophoresis 4mg /ml Dexamethasone;Moist Heat;Traction;Ultrasound;DME Instruction;Gait training;Stair training;Functional mobility training;Therapeutic activities;Therapeutic exercise;Balance training;Neuromuscular re-education;Patient/family education;Manual techniques;Passive range of motion;Dry needling;Vestibular   PT Next Visit Plan progress cervical retraction, manual technique for pain and ROM, progression to DNF strengthening   PT Home Exercise Plan repeated cervical retractions 2 x 10, 4 times/day   Consulted and Agree with Plan of Care Patient      Patient will benefit from skilled therapeutic intervention in order to improve the following deficits and impairments:  Pain, Decreased range of motion, Decreased strength  Visit Diagnosis: Cervicalgia     Problem List Patient Active Problem List   Diagnosis Date Noted  . Allergic rhinitis, seasonal 11/13/2015  . Avitaminosis D 11/13/2015  . Essential (primary) hypertension 08/28/2015  . Gastro-esophageal reflux disease without esophagitis 08/28/2015  . Combined fat and carbohydrate induced hyperlipemia 08/28/2015  . Malignant neoplasm of breast ([place]) 07/13/2014  . HLD (hyperlipidemia) 05/09/2014  . Inflamed nasal mucosa 05/09/2014  . Acid reflux 04/06/2014  . BP (high blood pressure) 04/06/2014  . Adult hypothyroidism 04/06/2014  . Degenerative arthritis of lumbar spine 04/06/2014    Dorrance Sellick PT DPT 11/29/2016, 12:08 PM  [facility] [address] [place], [address] Phone: [phone]   Fax:  [phone]  Name: Margaret Townsend MRN: [id number] Date of Birth: 1946/01/26

## 2016-12-03 ENCOUNTER — Encounter: Payer: Commercial Managed Care - HMO | Admitting: Physical Therapy

## 2016-12-04 ENCOUNTER — Ambulatory Visit: Payer: Medicare HMO | Attending: Family | Admitting: Physical Therapy

## 2016-12-04 DIAGNOSIS — M25522 Pain in left elbow: Secondary | ICD-10-CM | POA: Insufficient documentation

## 2016-12-04 DIAGNOSIS — M5412 Radiculopathy, cervical region: Secondary | ICD-10-CM | POA: Diagnosis not present

## 2016-12-04 DIAGNOSIS — M542 Cervicalgia: Secondary | ICD-10-CM | POA: Diagnosis not present

## 2016-12-04 DIAGNOSIS — M25622 Stiffness of left elbow, not elsewhere classified: Secondary | ICD-10-CM | POA: Insufficient documentation

## 2016-12-04 NOTE — Therapy (Signed)
[facility] [address] [place], [address] Phone: [phone]   Fax:  [phone]  Physical Therapy Treatment  Patient Details  Name: Margaret Townsend MRN: [id number] Date of Birth: [birth year] Referring Provider: Collier Bullock  Encounter Date: 12/04/2016      PT End of Session - 12/04/16 1204    Visit Number 5   Number of Visits 17   Date for PT Re-Evaluation 2017/01/16   Authorization Type g codes January 20, 2023   PT Start Time 0851   PT Stop Time 0930   PT Time Calculation (min) 39 min   Activity Tolerance Patient tolerated treatment well   Behavior During Therapy [facility] for tasks assessed/performed      Past Medical History:  Diagnosis Date  . Cancer (HCC)    breast  . Hypertension   . Thyroid disease     Past Surgical History:  Procedure Laterality Date  . ABDOMINAL HYSTERECTOMY    . BREAST SURGERY    . MASTECTOMY      There were no vitals filed for this visit.      Subjective Assessment - 12/04/16 0859    Subjective Pt reports new right sided rib pain that insidiously came on 2 days ago but denies difficulty breathing, or pain with breathing. Pain is worst at end of day and is primarily with sit<>stand. Pain improves with hot shower. She states that her balance is improving as is the numbness and tingling in her hand and fingers. Pt States that the pain in her neck is improving and her pain is mostly located in her left shoulder   Pertinent History Pt reports that she feel of the side of her steps in the garage on 08/06/16. She struck her head and L elbow on shelves next to the step. She suffered a L elbow dislocation when she fell and they performed a closed reduction. She also suffered a lunate fracture and was subsequently immobilized. She was seeing OT for her L elbow but was limited due to pulling sensation in her L neck and pain in her wrist with all resisted exercises. Following the fall pt  immediately started to have numbness/tingling in left hand in her first and second digits both dorsal and palmar. She also started having neck pain from the left side of her neck down into her L shoulder and down her left arm. She also reports worsening dizziness since the concussion and she is actually more concerned about this at the moment. She reports some increase nausea with the nausea due to her neck pain. Pt has a history of R sided breast cancer with lymph node removal 2015. L side was never involved.      Diagnostic tests L hand MRI to assess lunate fracture; MRI brain due to high BP: no acute changes per patient; MRI: mild central canal stenosis C4-C7, severe C5-C7 L foraminal stenosis, Carotid U/S: clear on the R, <40% on the left per patient report, NCV: will be performed 11/13/16   Currently in Pain? Yes   Pain Score 3    Pain Location Rib cage   Pain Orientation Right   Pain Descriptors / Indicators Burning   Pain Onset More than a month ago             Objective: STM performed on R lat, teres major, pec minor, as well as serratus posterior, x20 min total.   Seated STM performed on B peri-scapular musculature x10 min.  Performed standing balance "look and reach" exercise, cervical rotation and reach to same side with opposite arm; repeated on other side. Instructed on standing posture, and provided verbal and visual cues of neck and arm movements.  2x1 min. Pt expressed doubt that she would be able to do so, was guarded with performance but ultimately able to perform.                    PT Education - 12/04/16 0931    Education provided Yes   Education Details balance HEP. Call the MD if pain in her ribs gets worse or unbearable. Continue hot shower, and add in stretching of restricted musculature. Appropriate use of restricted musculature to try to alleviate the symptoms especially in the morning. Use of wedge pillow to sleep if needed.   Person(s) Educated  Patient             PT Long Term Goals - 11/13/16 1353      PT LONG TERM GOAL #1   Title Pt will be independent with HEP in order to improve strength and balance in order to decrease fall risk and improve function at home and work.   Time 8   Period Weeks   Status New     PT LONG TERM GOAL #2   Title Pt will report worst pain on NPRS as 3/10 in order to demonstrate clinically significant reduction in neck pain   Baseline 11/12/16: worst: 5/10   Time 8   Period Weeks   Status New     PT LONG TERM GOAL #3   Title Pt will decrease NDI by at least 8.5 points in order to demonstrate clinically significant reduction in disability related to neck pain   Baseline 11/12/16: 68.2%   Time 8   Period Weeks   Status New               Plan - 12/04/16 1205    Clinical Impression Statement Pt rib pain decreased throughout the visit with STM, initially pt had significant pain with sit<>stand, by the end of session mostly pain free. Continue rib treatment and advance when necessary. Neck and shoulder pain continues to improve as does her balance. Look to assess balance formally at next session.   Rehab Potential Fair   Clinical Impairments Affecting Rehab Potential Positive: motivation, age; Negative: distal symptoms, chronicity   PT Frequency 2x / week   PT Duration 8 weeks   PT Treatment/Interventions ADLs/Self Care Home Management;Aquatic Therapy;Biofeedback;Canalith Repostioning;Cryotherapy;Electrical Stimulation;Iontophoresis 4mg /ml Dexamethasone;Moist Heat;Traction;Ultrasound;DME Instruction;Gait training;Stair training;Functional mobility training;Therapeutic activities;Therapeutic exercise;Balance training;Neuromuscular re-education;Patient/family education;Manual techniques;Passive range of motion;Dry needling;Vestibular   PT Next Visit Plan progress cervical retraction, manual technique for pain and ROM, progression to DNF strengthening   PT Home Exercise Plan repeated  cervical retractions 2 x 10, 4 times/day   Consulted and Agree with Plan of Care Patient      Patient will benefit from skilled therapeutic intervention in order to improve the following deficits and impairments:  Pain, Decreased range of motion, Decreased strength  Visit Diagnosis: Cervicalgia  Radiculopathy, cervical region     Problem List Patient Active Problem List   Diagnosis Date Noted  . Allergic rhinitis, seasonal 11/13/2015  . Avitaminosis D 11/13/2015  . Essential (primary) hypertension 08/28/2015  . Gastro-esophageal reflux disease without esophagitis 08/28/2015  . Combined fat and carbohydrate induced hyperlipemia 08/28/2015  . Malignant neoplasm of breast ([place]) 07/13/2014  . HLD (hyperlipidemia) 05/09/2014  . Inflamed nasal mucosa 05/09/2014  .  Acid reflux 04/06/2014  . BP (high blood pressure) 04/06/2014  . Adult hypothyroidism 04/06/2014  . Degenerative arthritis of lumbar spine 04/06/2014    Fisher,Benjamin PT DPT 12/04/2016, 12:11 PM  [facility] [address] [place], [address] Phone: [phone]   Fax:  [phone]  Name: DAIONNA NANDA MRN: [id number] Date of Birth: 1946-05-06

## 2016-12-05 ENCOUNTER — Encounter: Payer: Commercial Managed Care - HMO | Admitting: Physical Therapy

## 2016-12-09 ENCOUNTER — Encounter: Payer: Commercial Managed Care - HMO | Admitting: Physical Therapy

## 2016-12-09 ENCOUNTER — Ambulatory Visit: Payer: Medicare HMO | Admitting: Physical Therapy

## 2016-12-10 ENCOUNTER — Encounter: Payer: Commercial Managed Care - HMO | Admitting: Physical Therapy

## 2016-12-11 DIAGNOSIS — M5412 Radiculopathy, cervical region: Secondary | ICD-10-CM | POA: Diagnosis not present

## 2016-12-11 DIAGNOSIS — M5030 Other cervical disc degeneration, unspecified cervical region: Secondary | ICD-10-CM | POA: Diagnosis not present

## 2016-12-11 DIAGNOSIS — M4802 Spinal stenosis, cervical region: Secondary | ICD-10-CM | POA: Diagnosis not present

## 2016-12-12 ENCOUNTER — Ambulatory Visit: Payer: Medicare HMO | Admitting: Physical Therapy

## 2016-12-12 DIAGNOSIS — M25622 Stiffness of left elbow, not elsewhere classified: Secondary | ICD-10-CM | POA: Diagnosis not present

## 2016-12-12 DIAGNOSIS — M25522 Pain in left elbow: Secondary | ICD-10-CM | POA: Diagnosis not present

## 2016-12-12 DIAGNOSIS — M542 Cervicalgia: Secondary | ICD-10-CM | POA: Diagnosis not present

## 2016-12-12 DIAGNOSIS — M5412 Radiculopathy, cervical region: Secondary | ICD-10-CM | POA: Diagnosis not present

## 2016-12-12 NOTE — Therapy (Signed)
[facility] [address], [place], [address] Phone: [phone]   Fax:  [phone]  Physical Therapy Treatment  Patient Details  Name: Margaret Townsend MRN: [id number] Date of Birth: 1945-12-09 Referring Provider: Collier Bullock  Encounter Date: 12/12/2016      PT End of Session - 12/12/16 1011    Visit Number 6   Number of Visits 17   Date for PT Re-Evaluation 02-06-2017   Authorization Type g codes 02/10/2023   PT Start Time 0908   PT Stop Time 0953   PT Time Calculation (min) 45 min   Activity Tolerance Patient tolerated treatment well   Behavior During Therapy [facility] for tasks assessed/performed      Past Medical History:  Diagnosis Date  . Cancer (HCC)    breast  . Hypertension   . Thyroid disease     Past Surgical History:  Procedure Laterality Date  . ABDOMINAL HYSTERECTOMY    . BREAST SURGERY    . MASTECTOMY      There were no vitals filed for this visit.      Subjective Assessment - 12/12/16 0909    Subjective Pt has been feeling a little better, with a couple of bad days of feeling weak all over. PCP set her up for injections in 3 weeks but left option open to cancel if no longer needed. Pt reports "dull" feeling possibly coming from her eyes. Pt states her pain is still located in her L shoulder down to her arm with a 'catching' feeling, is not experiencing rib pain.     Pertinent History Pt reports that she feel of the side of her steps in the garage on 08/06/16. She struck her head and L elbow on shelves next to the step. She suffered a L elbow dislocation when she fell and they performed a closed reduction. She also suffered a lunate fracture and was subsequently immobilized. She was seeing OT for her L elbow but was limited due to pulling sensation in her L neck and pain in her wrist with all resisted exercises. Following the fall pt immediately started to have numbness/tingling in left hand in  her first and second digits both dorsal and palmar. She also started having neck pain from the left side of her neck down into her L shoulder and down her left arm. She also reports worsening dizziness since the concussion and she is actually more concerned about this at the moment. She reports some increase nausea with the nausea due to her neck pain. Pt has a history of R sided breast cancer with lymph node removal 2014/03/31. L side was never involved.      Diagnostic tests L hand MRI to assess lunate fracture; MRI brain due to high BP: no acute changes per patient; MRI: mild central canal stenosis C4-C7, severe C5-C7 L foraminal stenosis, Carotid U/S: clear on the R, <40% on the left per patient report, NCV: will be performed 11/13/16   Currently in Pain? Yes   Pain Location Shoulder   Pain Orientation Left   Pain Onset More than a month ago       Objective:  L shoulder PROM flexion with gentle oscillations (Floppy fish mob) x5 min.  glenohumeral inferior mobilization grade 1 to decrease muscle guarding and decrease pain.2x2 min - pt had difficulty tolerating position, performed at 90 deg. abd.  STM to L ant delt, biceps, middle delt, posterior delt, cervicothoracic musculature including UT, levator,  suboccipital release with traction, 5x45 sec. Pt reported that since her fall she has felt "compacted", traction felt like it "needed to happen".  Upper thoracic CPA mobilization grade 1 to decrease pain and calm down nervous system. Pt had to adjust L shoulder to find comfortable position while prone. Slow and deliberate elbow flexion/extension wrist flexion/extension to facilitate increased ROM.    Pt increased L shoulder ROM and appears more comfortable using LUE, however she continues to experience pain that limits her in ADLs.Pt apprehensive about assuming positions that are uncomfortable for her LUE and often guards L shoulder.                      PT Education - 12/12/16 0947     Education provided Yes   Education Details Continue HEP    Person(s) Educated Patient   Methods Explanation   Comprehension Verbalized understanding             PT Long Term Goals - 11/13/16 1353      PT LONG TERM GOAL #1   Title Pt will be independent with HEP in order to improve strength and balance in order to decrease fall risk and improve function at home and work.   Time 8   Period Weeks   Status New     PT LONG TERM GOAL #2   Title Pt will report worst pain on NPRS as 3/10 in order to demonstrate clinically significant reduction in neck pain   Baseline 11/12/16: worst: 5/10   Time 8   Period Weeks   Status New     PT LONG TERM GOAL #3   Title Pt will decrease NDI by at least 8.5 points in order to demonstrate clinically significant reduction in disability related to neck pain   Baseline 11/12/16: 68.2%   Time 8   Period Weeks   Status New               Plan - 12/12/16 0949    Clinical Impression Statement STM to neck and shoulder musculature decreased pain and increased L shoulder ROM. Pt is increasing use of L UE. Pt will benefit from gentle PROM, thoracic spine CPA mobs, and STM. At next session consider dry needling of L shoulder musculature and subocciptals, Berg Balance scale. Encouraged pt consideration of starting Silver Sneakers program.    Rehab Potential Fair   Clinical Impairments Affecting Rehab Potential Positive: motivation, age; Negative: distal symptoms, chronicity   PT Frequency 2x / week   PT Duration 8 weeks   PT Treatment/Interventions ADLs/Self Care Home Management;Aquatic Therapy;Biofeedback;Canalith Repostioning;Cryotherapy;Electrical Stimulation;Iontophoresis 4mg /ml Dexamethasone;Moist Heat;Traction;Ultrasound;DME Instruction;Gait training;Stair training;Functional mobility training;Therapeutic activities;Therapeutic exercise;Balance training;Neuromuscular re-education;Patient/family education;Manual techniques;Passive range of  motion;Dry needling;Vestibular   PT Next Visit Plan progress cervical retraction, manual technique for pain and ROM, progression to DNF strengthening   PT Home Exercise Plan repeated cervical retractions 2 x 10, 4 times/day   Consulted and Agree with Plan of Care Patient      Patient will benefit from skilled therapeutic intervention in order to improve the following deficits and impairments:  Pain, Decreased range of motion, Decreased strength  Visit Diagnosis: Cervicalgia     Problem List Patient Active Problem List   Diagnosis Date Noted  . Allergic rhinitis, seasonal 11/13/2015  . Avitaminosis D 11/13/2015  . Essential (primary) hypertension 08/28/2015  . Gastro-esophageal reflux disease without esophagitis 08/28/2015  . Combined fat and carbohydrate induced hyperlipemia 08/28/2015  . Malignant neoplasm of breast ([facility]) 07/13/2014  .  HLD (hyperlipidemia) 05/09/2014  . Inflamed nasal mucosa 05/09/2014  . Acid reflux 04/06/2014  . BP (high blood pressure) 04/06/2014  . Adult hypothyroidism 04/06/2014  . Degenerative arthritis of lumbar spine 04/06/2014    Tanganyika Bowlds PT DPT 12/12/2016, 10:36 AM  [place] [address] [place], [address] Phone: [phone]   Fax:  [phone]  Name: GILLERMINA MAIERS MRN: [id number] Date of Birth: 24-Mar-1946

## 2016-12-16 DIAGNOSIS — Z131 Encounter for screening for diabetes mellitus: Secondary | ICD-10-CM | POA: Diagnosis not present

## 2016-12-16 DIAGNOSIS — R740 Nonspecific elevation of levels of transaminase and lactic acid dehydrogenase [LDH]: Secondary | ICD-10-CM | POA: Diagnosis not present

## 2016-12-16 DIAGNOSIS — H25813 Combined forms of age-related cataract, bilateral: Secondary | ICD-10-CM | POA: Diagnosis not present

## 2016-12-16 DIAGNOSIS — R7309 Other abnormal glucose: Secondary | ICD-10-CM | POA: Diagnosis not present

## 2016-12-17 ENCOUNTER — Ambulatory Visit: Payer: Medicare HMO | Admitting: Physical Therapy

## 2016-12-17 DIAGNOSIS — M25522 Pain in left elbow: Secondary | ICD-10-CM | POA: Diagnosis not present

## 2016-12-17 DIAGNOSIS — M5412 Radiculopathy, cervical region: Secondary | ICD-10-CM | POA: Diagnosis not present

## 2016-12-17 DIAGNOSIS — M542 Cervicalgia: Secondary | ICD-10-CM | POA: Diagnosis not present

## 2016-12-17 DIAGNOSIS — M25622 Stiffness of left elbow, not elsewhere classified: Secondary | ICD-10-CM | POA: Diagnosis not present

## 2016-12-17 NOTE — Therapy (Signed)
[place] [address] [place], [address] Phone: [phone]   Fax:  [phone]  Physical Therapy Treatment  Patient Details  Name: Margaret Townsend MRN: [id number] Date of Birth: [birth year] Referring Provider: Collier Bullock  Encounter Date: 12/17/2016      PT End of Session - 12/17/16 1026    Visit Number 7   Number of Visits 17   Date for PT Re-Evaluation 01-10-17   Authorization Type g codes Jan 14, 2023   PT Start Time 0930   PT Stop Time 1008   PT Time Calculation (min) 38 min   Activity Tolerance Patient tolerated treatment well   Behavior During Therapy [facility] for tasks assessed/performed      Past Medical History:  Diagnosis Date  . Cancer (HCC)    breast  . Hypertension   . Thyroid disease     Past Surgical History:  Procedure Laterality Date  . ABDOMINAL HYSTERECTOMY    . BREAST SURGERY    . MASTECTOMY      There were no vitals filed for this visit.      Subjective Assessment - 12/17/16 1018    Subjective Pt reports feeling a little bit better, but has L shoulder pain when trying to put on her jacket, and slight weakness with L grip strength. Pt states her appointment with her eye doctor went smooth and there are no issues. Pt has an appoinment with ENT doctor on Thursday.    Pertinent History Pt reports that she feel of the side of her steps in the garage on 08/06/16. She struck her head and L elbow on shelves next to the step. She suffered a L elbow dislocation when she fell and they performed a closed reduction. She also suffered a lunate fracture and was subsequently immobilized. She was seeing OT for her L elbow but was limited due to pulling sensation in her L neck and pain in her wrist with all resisted exercises. Following the fall pt immediately started to have numbness/tingling in left hand in her first and second digits both dorsal and palmar. She also started having neck pain from the  left side of her neck down into her L shoulder and down her left arm. She also reports worsening dizziness since the concussion and she is actually more concerned about this at the moment. She reports some increase nausea with the nausea due to her neck pain. Pt has a history of R sided breast cancer with lymph node removal 2015. L side was never involved.      Diagnostic tests L hand MRI to assess lunate fracture; MRI brain due to high BP: no acute changes per patient; MRI: mild central canal stenosis C4-C7, severe C5-C7 L foraminal stenosis, Carotid U/S: clear on the R, <40% on the left per patient report, NCV: will be performed 11/13/16   Currently in Pain? Yes   Pain Score 3    Pain Location Shoulder   Pain Orientation Left   Pain Descriptors / Indicators Sharp   Pain Type Chronic pain   Pain Onset More than a month ago      Objective: STM to L upper trapezius, middle trapezius, levator scapula, lats and infraspinatus to decrease pain from active TrP which referred pain into her L hand. Noted reproduction of N/T into hand with infraspinatus.   Pt performed standing "look and reach" exercise 5x each side but felt "her head was disconnected" so we discontinued this exercise.  Pt performed standing arm extension exercise to strengthen scapular musculature and stretch the anterior shoulder muscles. Pt stood with palms facing behind, bring arms back 5 inches to tap my hands, then bring forward again. 2x15. Pt reported feeling a stretch on the front of her L shoulder, but no pain.   Standing stomach to wall medicine ball taps with 2kg ball at waist level to help encourage scapular retraction and elbow extension (pt said L elbow extension feels tight so this will help with her elbow ROM). 2x15. Pt reported no pain. Pt instructed to perform for HEP while holding a food can, 10x once or twice a day.   Seated arm opening exercise to help facilitate arm external rotation and strengthen  scapular/shoulder muscles (pt reports not being able to bring [facility] such as washing hair). Pt seated with arms extended in front and palms touching. Pt instructed to open one arm out to side, then palms together, and open the other arm to side. 2x10. Pt able to open R shoulder farther than L shoulder, pt instructed to open L shoulder only to what is comfortable, and to stop before point of pain.                             PT Education - 12/17/16 1025    Education Details Continue HEP   Person(s) Educated Patient   Methods Explanation;Demonstration;Tactile cues;Verbal cues   Comprehension Verbalized understanding;Returned demonstration             PT Long Term Goals - 11/13/16 1353      PT LONG TERM GOAL #1   Title Pt will be independent with HEP in order to improve strength and balance in order to decrease fall risk and improve function at home and work.   Time 8   Period Weeks   Status New     PT LONG TERM GOAL #2   Title Pt will report worst pain on NPRS as 3/10 in order to demonstrate clinically significant reduction in neck pain   Baseline 11/12/16: worst: 5/10   Time 8   Period Weeks   Status New     PT LONG TERM GOAL #3   Title Pt will decrease NDI by at least 8.5 points in order to demonstrate clinically significant reduction in disability related to neck pain   Baseline 11/12/16: 68.2%   Time 8   Period Weeks   Status New               Plan - 12/17/16 1028    Clinical Impression Statement STM to L scapular musculature decreased pain and increased L shoulder ROM associated with active TrP. Introduced medicine ball exercises to increase L UE ROM and strength. Pt instructed to perform HEP and will gradually progress exercises.      Rehab Potential Fair   Clinical Impairments Affecting Rehab Potential Positive: motivation, age; Negative: distal symptoms, chronicity   PT Frequency 2x / week   PT Duration 8 weeks   PT  Treatment/Interventions ADLs/Self Care Home Management;Aquatic Therapy;Biofeedback;Canalith Repostioning;Cryotherapy;Electrical Stimulation;Iontophoresis 4mg /ml Dexamethasone;Moist Heat;Traction;Ultrasound;DME Instruction;Gait training;Stair training;Functional mobility training;Therapeutic activities;Therapeutic exercise;Balance training;Neuromuscular re-education;Patient/family education;Manual techniques;Passive range of motion;Dry needling;Vestibular   PT Next Visit Plan progress cervical retraction, manual technique for pain and ROM, progression to DNF strengthening   PT Home Exercise Plan repeated cervical retractions 2 x 10, 4 times/day   Consulted and Agree with Plan of Care Patient      Patient  will benefit from skilled therapeutic intervention in order to improve the following deficits and impairments:  Pain, Decreased range of motion, Decreased strength  Visit Diagnosis: Cervicalgia     Problem List Patient Active Problem List   Diagnosis Date Noted  . Allergic rhinitis, seasonal 11/13/2015  . Avitaminosis D 11/13/2015  . Essential (primary) hypertension 08/28/2015  . Gastro-esophageal reflux disease without esophagitis 08/28/2015  . Combined fat and carbohydrate induced hyperlipemia 08/28/2015  . Malignant neoplasm of breast ([place]) 07/13/2014  . HLD (hyperlipidemia) 05/09/2014  . Inflamed nasal mucosa 05/09/2014  . Acid reflux 04/06/2014  . BP (high blood pressure) 04/06/2014  . Adult hypothyroidism 04/06/2014  . Degenerative arthritis of lumbar spine 04/06/2014    Deysi Soldo PT DPT 12/17/2016, 11:04 AM  [place] [facility] [address] [place], [address] Phone: [phone]   Fax:  [phone]  Name: Margaret Townsend MRN: [id number] Date of Birth: [birth year]

## 2016-12-19 ENCOUNTER — Encounter: Payer: Commercial Managed Care - HMO | Admitting: Physical Therapy

## 2016-12-23 DIAGNOSIS — K219 Gastro-esophageal reflux disease without esophagitis: Secondary | ICD-10-CM | POA: Diagnosis not present

## 2016-12-23 DIAGNOSIS — E039 Hypothyroidism, unspecified: Secondary | ICD-10-CM | POA: Diagnosis not present

## 2016-12-23 DIAGNOSIS — Z131 Encounter for screening for diabetes mellitus: Secondary | ICD-10-CM | POA: Diagnosis not present

## 2016-12-23 DIAGNOSIS — I10 Essential (primary) hypertension: Secondary | ICD-10-CM | POA: Diagnosis not present

## 2016-12-24 ENCOUNTER — Ambulatory Visit: Payer: Medicare HMO | Admitting: Physical Therapy

## 2016-12-24 DIAGNOSIS — M542 Cervicalgia: Secondary | ICD-10-CM

## 2016-12-24 DIAGNOSIS — M25522 Pain in left elbow: Secondary | ICD-10-CM

## 2016-12-24 DIAGNOSIS — M5412 Radiculopathy, cervical region: Secondary | ICD-10-CM | POA: Diagnosis not present

## 2016-12-24 DIAGNOSIS — M25622 Stiffness of left elbow, not elsewhere classified: Secondary | ICD-10-CM | POA: Diagnosis not present

## 2016-12-25 NOTE — Therapy (Signed)
[place] [address] [place], [address] Phone: [phone]   Fax:  [phone]  Physical Therapy Treatment  Patient Details  Name: Margaret Townsend MRN: [id number] Date of Birth: 07/22/1946 Referring Provider: Collier Bullock  Encounter Date: 12/24/2016      PT End of Session - 12/24/16 0957    Visit Number 8   Number of Visits 17   Date for PT Re-Evaluation 01/12/2017   Authorization Type g codes 01/16/23   PT Start Time 0919   PT Stop Time 0959   PT Time Calculation (min) 40 min   Activity Tolerance Patient tolerated treatment well   Behavior During Therapy [facility] for tasks assessed/performed      Past Medical History:  Diagnosis Date  . Cancer (HCC)    breast  . Hypertension   . Thyroid disease     Past Surgical History:  Procedure Laterality Date  . ABDOMINAL HYSTERECTOMY    . BREAST SURGERY    . MASTECTOMY      There were no vitals filed for this visit.      Subjective Assessment - 12/24/16 0959    Subjective Pt reports having a tough morning and feeling tense in her neck and shoulders. Pt states she was not able to see ENT doctor due to snow last week, is looking forward to her appointment on Thursday. Pt states her HEP has been going well, but continues to feel "catching" in her L shoulder with certain movements, and feel weak in her L arm.    Pertinent History Pt reports that she feel of the side of her steps in the garage on 08/06/16. She struck her head and L elbow on shelves next to the step. She suffered a L elbow dislocation when she fell and they performed a closed reduction. She also suffered a lunate fracture and was subsequently immobilized. She was seeing OT for her L elbow but was limited due to pulling sensation in her L neck and pain in her wrist with all resisted exercises. Following the fall pt immediately started to have numbness/tingling in left hand in her first and second digits  both dorsal and palmar. She also started having neck pain from the left side of her neck down into her L shoulder and down her left arm. She also reports worsening dizziness since the concussion and she is actually more concerned about this at the moment. She reports some increase nausea with the nausea due to her neck pain. Pt has a history of R sided breast cancer with lymph node removal 03-06-14. L side was never involved.      Diagnostic tests L hand MRI to assess lunate fracture; MRI brain due to high BP: no acute changes per patient; MRI: mild central canal stenosis C4-C7, severe C5-C7 L foraminal stenosis, Carotid U/S: clear on the R, <40% on the left per patient report, NCV: will be performed 11/13/16   Currently in Pain? Yes   Pain Score 4    Pain Location Shoulder   Pain Orientation Left   Pain Descriptors / Indicators Sharp   Pain Type Chronic pain   Pain Radiating Towards L shoulder and down LUE   Pain Onset More than a month ago      Objective:  STM to L scapular musculature, with focus on L infraspinatus to decrease pain and muscle guarding. Pt reported feeling sensations down her LUE when working on active TrP. Afterwards pt appeared much  more relaxed and ready to do exercises.  Pt performed 10x standing hand taps, facing forward while reaching arms backwards and tapping my hands to promote scapular depression.  Pt performed standing "wall to stomach" taps using 1kg orange medicine ball to promote elbow extension and scapular retraction to engage periscapular muscles. Pt performed by holding ball at stomach level and tapping forward to wall, back to stomach, extending elbows straight down, then back up to stomach. Added elbow extension part to current HEP. 2x15  Pt performed seated arm opening exercise, focusing on shoulder ER to be able to bring [facility]. 10x Pt performed single arm ball roll out exercise standing next to plinth using pink ball, rolling ball forward and backward to work  on improving shoulder and elbow ROM. Pt able to perform without cues and was able to perform pain-free. 2x15 each side  Incorporated shoulder rolls with breathing throughout session to focus on decreasing shoulder tension and calming down nervous system before each exercise.   Focus of session was on rhythmic movement to increase pain-free motion and increase use of UE muscles.                              PT Education - 12/24/16 1003    Education provided Yes   Education Details Continue HEP   Person(s) Educated Patient   Methods Explanation;Demonstration;Tactile cues;Verbal cues   Comprehension Verbalized understanding;Returned demonstration             PT Long Term Goals - 11/13/16 1353      PT LONG TERM GOAL #1   Title Pt will be independent with HEP in order to improve strength and balance in order to decrease fall risk and improve function at home and work.   Time 8   Period Weeks   Status New     PT LONG TERM GOAL #2   Title Pt will report worst pain on NPRS as 3/10 in order to demonstrate clinically significant reduction in neck pain   Baseline 11/12/16: worst: 5/10   Time 8   Period Weeks   Status New     PT LONG TERM GOAL #3   Title Pt will decrease NDI by at least 8.5 points in order to demonstrate clinically significant reduction in disability related to neck pain   Baseline 11/12/16: 68.2%   Time 8   Period Weeks   Status New               Plan - 12/24/16 1005    Clinical Impression Statement Pt has significant muscle guarding and has not been using LUE very much as she anticipates pain. STM to L scapular musculature decreased pain and reduced muscle guarding.  Performed medicine ball exercises to increase LUE ROM and strength and explore pain-free range of movement. Continue HEP.    Rehab Potential Fair   Clinical Impairments Affecting Rehab Potential Positive: motivation, age; Negative: distal symptoms, chronicity   PT  Frequency 2x / week   PT Duration 8 weeks   PT Treatment/Interventions ADLs/Self Care Home Management;Aquatic Therapy;Biofeedback;Canalith Repostioning;Cryotherapy;Electrical Stimulation;Iontophoresis 4mg /ml Dexamethasone;Moist Heat;Traction;Ultrasound;DME Instruction;Gait training;Stair training;Functional mobility training;Therapeutic activities;Therapeutic exercise;Balance training;Neuromuscular re-education;Patient/family education;Manual techniques;Passive range of motion;Dry needling;Vestibular   PT Next Visit Plan progress cervical retraction, manual technique for pain and ROM, progression to DNF strengthening   PT Home Exercise Plan repeated cervical retractions 2 x 10, 4 times/day   Consulted and Agree with Plan of Care Patient  Patient will benefit from skilled therapeutic intervention in order to improve the following deficits and impairments:  Pain, Decreased range of motion, Decreased strength  Visit Diagnosis: Cervicalgia  Pain in left elbow  Stiffness of left elbow, not elsewhere classified     Problem List Patient Active Problem List   Diagnosis Date Noted  . Allergic rhinitis, seasonal 11/13/2015  . Avitaminosis D 11/13/2015  . Essential (primary) hypertension 08/28/2015  . Gastro-esophageal reflux disease without esophagitis 08/28/2015  . Combined fat and carbohydrate induced hyperlipemia 08/28/2015  . Malignant neoplasm of breast ([place]) 07/13/2014  . HLD (hyperlipidemia) 05/09/2014  . Inflamed nasal mucosa 05/09/2014  . Acid reflux 04/06/2014  . BP (high blood pressure) 04/06/2014  . Adult hypothyroidism 04/06/2014  . Degenerative arthritis of lumbar spine 04/06/2014    Fisher,Benjamin PT DPT 12/25/2016, 7:33 AM  [place] [facility] [address], [place], [address] Phone: [phone]   Fax:  [phone]  Name: Margaret Townsend MRN: [id number] Date of Birth: 1946-10-18

## 2016-12-26 ENCOUNTER — Ambulatory Visit: Payer: Medicare HMO | Admitting: Physical Therapy

## 2016-12-26 DIAGNOSIS — R42 Dizziness and giddiness: Secondary | ICD-10-CM | POA: Diagnosis not present

## 2016-12-30 ENCOUNTER — Ambulatory Visit: Payer: Medicare HMO | Admitting: Physical Therapy

## 2017-01-01 DIAGNOSIS — R42 Dizziness and giddiness: Secondary | ICD-10-CM | POA: Diagnosis not present

## 2017-01-02 ENCOUNTER — Encounter: Payer: Commercial Managed Care - HMO | Admitting: Physical Therapy

## 2017-01-02 DIAGNOSIS — M5412 Radiculopathy, cervical region: Secondary | ICD-10-CM | POA: Diagnosis not present

## 2017-01-02 DIAGNOSIS — M4802 Spinal stenosis, cervical region: Secondary | ICD-10-CM | POA: Diagnosis not present

## 2017-01-02 DIAGNOSIS — M5030 Other cervical disc degeneration, unspecified cervical region: Secondary | ICD-10-CM | POA: Diagnosis not present

## 2017-01-06 DIAGNOSIS — R42 Dizziness and giddiness: Secondary | ICD-10-CM | POA: Diagnosis not present

## 2017-01-07 DIAGNOSIS — R42 Dizziness and giddiness: Secondary | ICD-10-CM | POA: Diagnosis not present

## 2017-01-08 DIAGNOSIS — R42 Dizziness and giddiness: Secondary | ICD-10-CM | POA: Diagnosis not present

## 2017-01-13 DIAGNOSIS — R42 Dizziness and giddiness: Secondary | ICD-10-CM | POA: Diagnosis not present

## 2017-01-16 DIAGNOSIS — R42 Dizziness and giddiness: Secondary | ICD-10-CM | POA: Diagnosis not present

## 2017-01-20 DIAGNOSIS — R42 Dizziness and giddiness: Secondary | ICD-10-CM | POA: Diagnosis not present

## 2017-01-27 DIAGNOSIS — M47812 Spondylosis without myelopathy or radiculopathy, cervical region: Secondary | ICD-10-CM | POA: Diagnosis not present

## 2017-01-27 DIAGNOSIS — R42 Dizziness and giddiness: Secondary | ICD-10-CM | POA: Diagnosis not present

## 2017-01-28 DIAGNOSIS — R42 Dizziness and giddiness: Secondary | ICD-10-CM | POA: Diagnosis not present

## 2017-02-02 DIAGNOSIS — R062 Wheezing: Secondary | ICD-10-CM | POA: Diagnosis not present

## 2017-02-02 DIAGNOSIS — J069 Acute upper respiratory infection, unspecified: Secondary | ICD-10-CM | POA: Diagnosis not present

## 2017-02-02 DIAGNOSIS — R05 Cough: Secondary | ICD-10-CM | POA: Diagnosis not present

## 2017-02-05 ENCOUNTER — Encounter: Payer: Self-pay | Admitting: *Deleted

## 2017-02-05 ENCOUNTER — Emergency Department
Admission: EM | Admit: 2017-02-05 | Discharge: 2017-02-05 | Disposition: A | Discharge status: Home / Self Care | Payer: Medicare HMO | Attending: Emergency Medicine | Admitting: Emergency Medicine

## 2017-02-05 ENCOUNTER — Emergency Department: Payer: Medicare HMO

## 2017-02-05 DIAGNOSIS — I10 Essential (primary) hypertension: Secondary | ICD-10-CM | POA: Insufficient documentation

## 2017-02-05 DIAGNOSIS — R0602 Shortness of breath: Secondary | ICD-10-CM | POA: Diagnosis not present

## 2017-02-05 DIAGNOSIS — R05 Cough: Secondary | ICD-10-CM | POA: Diagnosis not present

## 2017-02-05 DIAGNOSIS — J069 Acute upper respiratory infection, unspecified: Secondary | ICD-10-CM | POA: Insufficient documentation

## 2017-02-05 DIAGNOSIS — R55 Syncope and collapse: Secondary | ICD-10-CM | POA: Diagnosis not present

## 2017-02-05 DIAGNOSIS — Z79899 Other long term (current) drug therapy: Secondary | ICD-10-CM | POA: Diagnosis not present

## 2017-02-05 DIAGNOSIS — E039 Hypothyroidism, unspecified: Secondary | ICD-10-CM | POA: Diagnosis not present

## 2017-02-05 DIAGNOSIS — R000 Tachycardia, unspecified: Secondary | ICD-10-CM | POA: Diagnosis not present

## 2017-02-05 DIAGNOSIS — J209 Acute bronchitis, unspecified: Secondary | ICD-10-CM | POA: Diagnosis not present

## 2017-02-05 LAB — CBC
HCT: 41.9 % (ref 35.0–47.0)
HEMOGLOBIN: 14.3 g/dL (ref 12.0–16.0)
MCH: 30.3 pg (ref 26.0–34.0)
MCHC: 34.2 g/dL (ref 32.0–36.0)
MCV: 88.4 fL (ref 80.0–100.0)
Platelets: 233 10*3/uL (ref 150–440)
RBC: 4.74 MIL/uL (ref 3.80–5.20)
RDW: 14.1 % (ref 11.5–14.5)
WBC: 9.6 10*3/uL (ref 3.6–11.0)

## 2017-02-05 LAB — BASIC METABOLIC PANEL
ANION GAP: 11 (ref 5–15)
BUN: 20 mg/dL (ref 6–20)
CO2: 22 mmol/L (ref 22–32)
Calcium: 9.3 mg/dL (ref 8.9–10.3)
Chloride: 104 mmol/L (ref 101–111)
Creatinine, Ser: 0.89 mg/dL (ref 0.44–1.00)
GFR calc non Af Amer: >60 mL/min (ref >60)
Glucose, Bld: 113 mg/dL — ABNORMAL HIGH (ref 65–99)
Potassium: 3.9 mmol/L (ref 3.5–5.1)
Sodium: 137 mmol/L (ref 135–145)

## 2017-02-05 MED ORDER — PREDNISONE 20 MG PO TABS: 40.0000 mg | ORAL_TABLET | Freq: Every day | ORAL | 0 refills | Status: DC

## 2017-02-05 MED ORDER — PREDNISONE 20 MG PO TABS
40.0000 mg | ORAL_TABLET | Freq: Once | ORAL | Status: AC
  Administered 2017-02-05: 40 mg via ORAL
  Filled 2017-02-05: qty 2

## 2017-02-05 MED ORDER — SODIUM CHLORIDE 0.9 % IV BOLUS (SEPSIS)
500.0000 mL | Freq: Once | INTRAVENOUS | Status: AC
  Administered 2017-02-05: 500 mL via INTRAVENOUS

## 2017-02-05 NOTE — ED Provider Notes (Signed)
[facility] Emergency Department Provider Note   ____________________________________________   I have reviewed the triage vital signs and the nursing notes.   HISTORY  Chief Complaint Cough and Fever   History limited by: Not Limited   HPI Margaret Townsend is a 71 y.o. female who presents to the emergency department today because of concerns for continued cough and shortness breath and fever. The patient states that the symptoms started 4 days ago. The patient went to urgent care was prescribed azithromycin. She was also instructed to take Tylenol for fevers. She states that the symptoms have gotten worse. She had fevers yesterday. She has had some slight chest discomfort with cough. Patient denies any history of COPD. States she has had a bad reaction to albuterol in the past.   Past Medical History:  Diagnosis Date  . Cancer (HCC)    breast  . Hypertension   . Thyroid disease     Patient Active Problem List   Diagnosis Date Noted  . Allergic rhinitis, seasonal 11/13/2015  . Avitaminosis D 11/13/2015  . Essential (primary) hypertension 08/28/2015  . Gastro-esophageal reflux disease without esophagitis 08/28/2015  . Combined fat and carbohydrate induced hyperlipemia 08/28/2015  . Malignant neoplasm of breast ([place]) 07/13/2014  . HLD (hyperlipidemia) 05/09/2014  . Inflamed nasal mucosa 05/09/2014  . Acid reflux 04/06/2014  . BP (high blood pressure) 04/06/2014  . Adult hypothyroidism 04/06/2014  . Degenerative arthritis of lumbar spine 04/06/2014    Past Surgical History:  Procedure Laterality Date  . ABDOMINAL HYSTERECTOMY    . BREAST SURGERY    . MASTECTOMY      Prior to Admission medications   Medication Sig Start Date End Date Taking? Authorizing Provider  acetaminophen (TYLENOL) 325 MG tablet Take 650 mg by mouth. 12/12/15   Historical Provider, MD  Ascorbic Acid (VITAMIN C) 1000 MG tablet Take by mouth.    Historical Provider, MD   butalbital-acetaminophen-caffeine (FIORICET, ESGIC) 50-325-40 MG tablet Take 1-2 tablets by mouth every 6 (six) hours as needed for headache. Patient not taking: Reported on 11/12/2016 09/09/16 09/09/17  Earleen Newport, MD  Cholecalciferol (VITAMIN D-1000 MAX ST) 1000 units tablet Take by mouth.    Historical Provider, MD  fluticasone (FLONASE) 50 MCG/ACT nasal spray Place into the nose. 01/24/15   Historical Provider, MD  Nyoka Cowden Tea Oil Fragrance OIL by Does not apply route.    Historical Provider, MD  letrozole (FEMARA) 2.5 MG tablet Take 2.5 mg by mouth daily.    Historical Provider, MD  levothyroxine (SYNTHROID, LEVOTHROID) 125 MCG tablet Take by mouth. 01/25/16   Historical Provider, MD  Magnesium 300 MG CAPS Take by mouth.    Historical Provider, MD  Multiple Vitamin (MULTI-VITAMINS) TABS Take by mouth.    Historical Provider, MD  omeprazole (PRILOSEC) 20 MG capsule Take by mouth. 01/24/15   Historical Provider, MD  ondansetron (ZOFRAN) 4 MG tablet Take 4 mg by mouth. 01/18/16   Historical Provider, MD  valsartan-hydrochlorothiazide (DIOVAN-HCT) 160-12.5 MG tablet Take by mouth. 04/25/15   Historical Provider, MD  vitamin E 400 UNIT capsule Take by mouth.    Historical Provider, MD    Allergies Codeine; Oxycodone-acetaminophen; Propoxyphene; Albuterol; Demerol [meperidine]; Dilaudid [hydromorphone hcl]; Morphine and related; Sulfa antibiotics; Amoxicillin-pot clavulanate; Hydrocodone-acetaminophen; Iodinated diagnostic agents; Levofloxacin; Promethazine; and Tramadol  History reviewed. No pertinent family history.  Social History Social History  Substance Use Topics  . Smoking status: Never Smoker  . Smokeless tobacco: Never Used  . Alcohol use  No    Review of Systems  Constitutional: Negative for fever. Cardiovascular: Negative for chest pain. Respiratory: Positive for shortness of breath, cough. Gastrointestinal: Negative for abdominal pain, vomiting and  diarrhea. Neurological: Negative for headaches, focal weakness or numbness.  10-point ROS otherwise negative.  ____________________________________________   PHYSICAL EXAM:  VITAL SIGNS: ED Triage Vitals  Enc Vitals Group     BP 02/05/17 1139 121/81     Pulse Rate 02/05/17 1139 (!) 111     Resp 02/05/17 1139 20     Temp 02/05/17 1139 98.8 F (37.1 C)     Temp Source 02/05/17 1139 Oral     SpO2 02/05/17 1139 96 %     Weight 02/05/17 1140 169 lb (76.7 kg)     Height 02/05/17 1140 5\' 5"  (1.651 m)     Head Circumference --      Peak Flow --      Pain Score 02/05/17 1140 2   Constitutional: Alert and oriented. Well appearing and in no distress. Eyes: Conjunctivae are normal. Normal extraocular movements. ENT   Head: Normocephalic and atraumatic.   Nose: No congestion/rhinnorhea.   Mouth/Throat: Mucous membranes are moist.   Neck: No stridor. Hematological/Lymphatic/Immunilogical: No cervical lymphadenopathy. Cardiovascular: Normal rate, regular rhythm.  No murmurs, rubs, or gallops. Respiratory: Normal respiratory effort without tachypnea nor retractions. Mild wheezing appreciated. Gastrointestinal: Soft and non tender. No rebound. No guarding.  Genitourinary: Deferred Musculoskeletal: Normal range of motion in all extremities. No lower extremity edema. Neurologic:  Normal speech and language. No gross focal neurologic deficits are appreciated.  Skin:  Skin is warm, dry and intact. No rash noted. Psychiatric: Mood and affect are normal. Speech and behavior are normal. Patient exhibits appropriate insight and judgment.  ____________________________________________    LABS (pertinent positives/negatives)  Labs Reviewed  BASIC METABOLIC PANEL - Abnormal; Notable for the following:       Result Value   Glucose, Bld 113 (*)    All other components within normal limits  CBC  URINALYSIS, COMPLETE (UACMP) WITH MICROSCOPIC  CBG MONITORING, ED      ____________________________________________   EKG  I, Nance Pear, attending physician, personally viewed and interpreted this EKG  EKG Time: 1158 Rate: 105 Rhythm: sinus tachycardia Axis: left axis deviation Intervals: qtc 438 QRS: narrow ST changes: no st elevation Impression: sinus tachycardia  ____________________________________________    RADIOLOGY  CXR   IMPRESSION:  Mild chronic bronchitic changes. No pneumonia, CHF, nor other acute  cardiopulmonary abnormality.  ____________________   PROCEDURES  Procedures  ____________________________________________   INITIAL IMPRESSION / ASSESSMENT AND PLAN / ED COURSE  Pertinent labs & imaging results that were available during my care of the patient were reviewed by me and considered in my medical decision making (see chart for details).  Patient presented to the emergency department today because of concerns for continued upper respiratory infection. The patient had mild wheezing. Unfortunately patient has a bad reaction to albuterol. Additionally patient was mildly tachycardic however patient family states she has a history of running a slightly high heart rate. Will plan on giving patient some IV fluids here. Additionally will give patient some prednisone to help with cough and wheezing. Fortunately the patient is outside of the window for Tamiflu thus do not feel flu test is necessary. Discussed this with patient who was comfortable foregoing flu swab at this point.  ____________________________________________   FINAL CLINICAL IMPRESSION(S) / ED DIAGNOSES  Final diagnoses:  Upper respiratory tract infection, unspecified type  Note: This dictation was prepared with Dragon dictation. Any transcriptional errors that result from this process are unintentional     Nance Pear, MD 02/05/17 1546

## 2017-02-05 NOTE — ED Notes (Signed)
Family would like flu test for pt

## 2017-02-05 NOTE — ED Triage Notes (Signed)
Pt to ED reporting cough and fever since Saturday. Pt was given Zpack per PCP and reports her symptoms have worsened since. Cough is non-productive but pt verbalized having SOB and increased weakness with diarrhea beginning Sunday after beginning antibiotics. Pt reports highest fever was 101.3 yesterday. No fever today and no reported OTC medications taken today.

## 2017-02-05 NOTE — Discharge Instructions (Signed)
Please seek medical attention for any high fevers, chest pain, shortness of breath, change in behavior, persistent vomiting, bloody stool or any other new or concerning symptoms.  

## 2017-02-05 NOTE — ED Notes (Signed)
Pt from home with cough x 4 days. She began taking zpack on Sunday. She reports worsening symptoms since then. PT states she is unable to catch her breath. Pt alert & oriented. NAD noted.

## 2017-02-05 NOTE — ED Notes (Signed)
Pt discharged home after verbalizing understanding of discharge instructions; nad noted. 

## 2017-02-13 DIAGNOSIS — R42 Dizziness and giddiness: Secondary | ICD-10-CM | POA: Diagnosis not present

## 2017-02-20 DIAGNOSIS — R42 Dizziness and giddiness: Secondary | ICD-10-CM | POA: Diagnosis not present

## 2017-02-25 DIAGNOSIS — M5412 Radiculopathy, cervical region: Secondary | ICD-10-CM | POA: Diagnosis not present

## 2017-04-07 DIAGNOSIS — Z170 Estrogen receptor positive status [ER+]: Secondary | ICD-10-CM | POA: Diagnosis not present

## 2017-04-07 DIAGNOSIS — C50911 Malignant neoplasm of unspecified site of right female breast: Secondary | ICD-10-CM | POA: Diagnosis not present

## 2017-04-15 DIAGNOSIS — I10 Essential (primary) hypertension: Secondary | ICD-10-CM | POA: Diagnosis not present

## 2017-04-15 DIAGNOSIS — E039 Hypothyroidism, unspecified: Secondary | ICD-10-CM | POA: Diagnosis not present

## 2017-04-15 DIAGNOSIS — Z131 Encounter for screening for diabetes mellitus: Secondary | ICD-10-CM | POA: Diagnosis not present

## 2017-04-22 DIAGNOSIS — E782 Mixed hyperlipidemia: Secondary | ICD-10-CM | POA: Diagnosis not present

## 2017-04-22 DIAGNOSIS — Z1211 Encounter for screening for malignant neoplasm of colon: Secondary | ICD-10-CM | POA: Diagnosis not present

## 2017-04-22 DIAGNOSIS — Z0000 Encounter for general adult medical examination without abnormal findings: Secondary | ICD-10-CM | POA: Diagnosis not present

## 2017-04-22 DIAGNOSIS — R7303 Prediabetes: Secondary | ICD-10-CM | POA: Diagnosis not present

## 2017-04-30 DIAGNOSIS — E782 Mixed hyperlipidemia: Secondary | ICD-10-CM | POA: Diagnosis not present

## 2017-04-30 DIAGNOSIS — Z0000 Encounter for general adult medical examination without abnormal findings: Secondary | ICD-10-CM | POA: Diagnosis not present

## 2017-04-30 DIAGNOSIS — R7303 Prediabetes: Secondary | ICD-10-CM | POA: Diagnosis not present

## 2017-04-30 DIAGNOSIS — Z1211 Encounter for screening for malignant neoplasm of colon: Secondary | ICD-10-CM | POA: Diagnosis not present

## 2017-08-11 DIAGNOSIS — D1800 Hemangioma unspecified site: Secondary | ICD-10-CM | POA: Diagnosis not present

## 2017-08-11 DIAGNOSIS — L821 Other seborrheic keratosis: Secondary | ICD-10-CM | POA: Diagnosis not present

## 2017-08-11 DIAGNOSIS — L918 Other hypertrophic disorders of the skin: Secondary | ICD-10-CM | POA: Diagnosis not present

## 2017-09-10 DIAGNOSIS — K006 Disturbances in tooth eruption: Secondary | ICD-10-CM | POA: Diagnosis not present

## 2017-10-20 DIAGNOSIS — R7303 Prediabetes: Secondary | ICD-10-CM | POA: Diagnosis not present

## 2017-10-20 DIAGNOSIS — E782 Mixed hyperlipidemia: Secondary | ICD-10-CM | POA: Diagnosis not present

## 2017-10-27 DIAGNOSIS — C50911 Malignant neoplasm of unspecified site of right female breast: Secondary | ICD-10-CM | POA: Diagnosis not present

## 2017-10-27 DIAGNOSIS — Z8041 Family history of malignant neoplasm of ovary: Secondary | ICD-10-CM | POA: Diagnosis not present

## 2017-10-27 DIAGNOSIS — Z170 Estrogen receptor positive status [ER+]: Secondary | ICD-10-CM | POA: Diagnosis not present

## 2017-10-27 DIAGNOSIS — Z9011 Acquired absence of right breast and nipple: Secondary | ICD-10-CM | POA: Diagnosis not present

## 2017-10-27 DIAGNOSIS — Z79811 Long term (current) use of aromatase inhibitors: Secondary | ICD-10-CM | POA: Diagnosis not present

## 2017-10-27 DIAGNOSIS — M79621 Pain in right upper arm: Secondary | ICD-10-CM | POA: Diagnosis not present

## 2017-10-27 DIAGNOSIS — Z823 Family history of stroke: Secondary | ICD-10-CM | POA: Diagnosis not present

## 2017-10-27 DIAGNOSIS — Z90721 Acquired absence of ovaries, unilateral: Secondary | ICD-10-CM | POA: Diagnosis not present

## 2017-10-27 DIAGNOSIS — Z888 Allergy status to other drugs, medicaments and biological substances status: Secondary | ICD-10-CM | POA: Diagnosis not present

## 2017-11-06 DIAGNOSIS — Z8639 Personal history of other endocrine, nutritional and metabolic disease: Secondary | ICD-10-CM | POA: Diagnosis not present

## 2017-11-06 DIAGNOSIS — E7800 Pure hypercholesterolemia, unspecified: Secondary | ICD-10-CM | POA: Insufficient documentation

## 2017-11-06 DIAGNOSIS — I10 Essential (primary) hypertension: Secondary | ICD-10-CM | POA: Diagnosis not present

## 2017-11-06 DIAGNOSIS — K219 Gastro-esophageal reflux disease without esophagitis: Secondary | ICD-10-CM | POA: Diagnosis not present

## 2017-11-06 DIAGNOSIS — R7303 Prediabetes: Secondary | ICD-10-CM | POA: Diagnosis not present

## 2017-11-06 DIAGNOSIS — E039 Hypothyroidism, unspecified: Secondary | ICD-10-CM | POA: Diagnosis not present

## 2017-11-07 DIAGNOSIS — R7303 Prediabetes: Secondary | ICD-10-CM | POA: Insufficient documentation

## 2017-11-07 DIAGNOSIS — Z8639 Personal history of other endocrine, nutritional and metabolic disease: Secondary | ICD-10-CM | POA: Insufficient documentation

## 2018-01-14 DIAGNOSIS — H35361 Drusen (degenerative) of macula, right eye: Secondary | ICD-10-CM | POA: Diagnosis not present

## 2018-02-20 DIAGNOSIS — H2513 Age-related nuclear cataract, bilateral: Secondary | ICD-10-CM | POA: Diagnosis not present

## 2018-04-23 DIAGNOSIS — R7303 Prediabetes: Secondary | ICD-10-CM | POA: Diagnosis not present

## 2018-04-23 DIAGNOSIS — E559 Vitamin D deficiency, unspecified: Secondary | ICD-10-CM | POA: Diagnosis not present

## 2018-04-23 DIAGNOSIS — E039 Hypothyroidism, unspecified: Secondary | ICD-10-CM | POA: Diagnosis not present

## 2018-04-23 DIAGNOSIS — I10 Essential (primary) hypertension: Secondary | ICD-10-CM | POA: Diagnosis not present

## 2018-04-23 DIAGNOSIS — E7800 Pure hypercholesterolemia, unspecified: Secondary | ICD-10-CM | POA: Diagnosis not present

## 2018-04-23 DIAGNOSIS — Z8639 Personal history of other endocrine, nutritional and metabolic disease: Secondary | ICD-10-CM | POA: Diagnosis not present

## 2018-04-28 DIAGNOSIS — H2512 Age-related nuclear cataract, left eye: Secondary | ICD-10-CM | POA: Diagnosis not present

## 2018-04-30 DIAGNOSIS — Z23 Encounter for immunization: Secondary | ICD-10-CM | POA: Diagnosis not present

## 2018-04-30 DIAGNOSIS — R7303 Prediabetes: Secondary | ICD-10-CM | POA: Diagnosis not present

## 2018-04-30 DIAGNOSIS — Z0000 Encounter for general adult medical examination without abnormal findings: Secondary | ICD-10-CM | POA: Diagnosis not present

## 2018-04-30 DIAGNOSIS — E039 Hypothyroidism, unspecified: Secondary | ICD-10-CM | POA: Diagnosis not present

## 2018-04-30 NOTE — Discharge Instructions (Signed)

## 2018-05-06 ENCOUNTER — Encounter
Admission: RE | Disposition: A | Discharge status: Home / Self Care | Payer: Self-pay | Source: Ambulatory Visit | Attending: Ophthalmology

## 2018-05-06 ENCOUNTER — Ambulatory Visit: Payer: Medicare HMO | Admitting: Anesthesiology

## 2018-05-06 ENCOUNTER — Ambulatory Visit
Admission: RE | Admit: 2018-05-06 | Discharge: 2018-05-06 | Disposition: A | Discharge status: Home / Self Care | Payer: Medicare HMO | Source: Ambulatory Visit | Attending: Ophthalmology | Admitting: Ophthalmology

## 2018-05-06 DIAGNOSIS — H2512 Age-related nuclear cataract, left eye: Secondary | ICD-10-CM | POA: Diagnosis not present

## 2018-05-06 DIAGNOSIS — I10 Essential (primary) hypertension: Secondary | ICD-10-CM | POA: Insufficient documentation

## 2018-05-06 DIAGNOSIS — H25812 Combined forms of age-related cataract, left eye: Secondary | ICD-10-CM | POA: Diagnosis not present

## 2018-05-06 DIAGNOSIS — Z79891 Long term (current) use of opiate analgesic: Secondary | ICD-10-CM | POA: Diagnosis not present

## 2018-05-06 DIAGNOSIS — Z9011 Acquired absence of right breast and nipple: Secondary | ICD-10-CM | POA: Insufficient documentation

## 2018-05-06 DIAGNOSIS — Z853 Personal history of malignant neoplasm of breast: Secondary | ICD-10-CM | POA: Insufficient documentation

## 2018-05-06 DIAGNOSIS — K219 Gastro-esophageal reflux disease without esophagitis: Secondary | ICD-10-CM | POA: Diagnosis not present

## 2018-05-06 DIAGNOSIS — Z79899 Other long term (current) drug therapy: Secondary | ICD-10-CM | POA: Insufficient documentation

## 2018-05-06 DIAGNOSIS — E039 Hypothyroidism, unspecified: Secondary | ICD-10-CM | POA: Insufficient documentation

## 2018-05-06 HISTORY — PX: CATARACT EXTRACTION W/PHACO: SHX586

## 2018-05-06 HISTORY — DX: Headache: R51

## 2018-05-06 HISTORY — DX: Headache, unspecified: R51.9

## 2018-05-06 HISTORY — DX: Hypothyroidism, unspecified: E03.9

## 2018-05-06 SURGERY — PHACOEMULSIFICATION, CATARACT, WITH IOL INSERTION
Anesthesia: Monitor Anesthesia Care | Site: Eye | Laterality: Left | Wound class: "Clean "

## 2018-05-06 MED ORDER — BRIMONIDINE TARTRATE-TIMOLOL 0.2-0.5 % OP SOLN
OPHTHALMIC | Status: DC | PRN
  Administered 2018-05-06: 1 [drp] via OPHTHALMIC

## 2018-05-06 MED ORDER — ARMC OPHTHALMIC DILATING DROPS
1.0000 "application " | OPHTHALMIC | Status: DC | PRN
  Administered 2018-05-06 (×3): 1 via OPHTHALMIC

## 2018-05-06 MED ORDER — EPINEPHRINE PF 1 MG/ML IJ SOLN
INTRAOCULAR | Status: DC | PRN
  Administered 2018-05-06: 56 mL via OPHTHALMIC

## 2018-05-06 MED ORDER — POLYMYXIN B-TRIMETHOPRIM 10000-0.1 UNIT/ML-% OP SOLN
1.0000 [drp] | OPHTHALMIC | Status: DC | PRN
  Administered 2018-05-06 (×3): 1 [drp] via OPHTHALMIC

## 2018-05-06 MED ORDER — MIDAZOLAM HCL 2 MG/2ML IJ SOLN
INTRAMUSCULAR | Status: DC | PRN
Start: 2018-05-06 — End: 2018-05-06
  Administered 2018-05-06: 2 mg via INTRAVENOUS

## 2018-05-06 MED ORDER — NA HYALUR & NA CHOND-NA HYALUR 0.4-0.35 ML IO KIT
PACK | INTRAOCULAR | Status: DC | PRN
  Administered 2018-05-06: 1 mL via INTRAOCULAR

## 2018-05-06 MED ORDER — FENTANYL CITRATE (PF) 100 MCG/2ML IJ SOLN
INTRAMUSCULAR | Status: DC | PRN
  Administered 2018-05-06: 50 ug via INTRAVENOUS

## 2018-05-06 MED ORDER — LACTATED RINGERS IV SOLN: 1000.0000 mL | INTRAVENOUS | Status: DC

## 2018-05-06 MED ORDER — CEFUROXIME OPHTHALMIC INJECTION 1 MG/0.1 ML
INJECTION | OPHTHALMIC | Status: DC | PRN
  Administered 2018-05-06: 0.1 mL via INTRACAMERAL

## 2018-05-06 MED ORDER — LIDOCAINE HCL (PF) 2 % IJ SOLN
INTRAOCULAR | Status: DC | PRN
  Administered 2018-05-06: 1 mL

## 2018-05-06 SURGICAL SUPPLY — 28 items
CANNULA ANT/CHMB 27G (MISCELLANEOUS) ×1 IMPLANT
CANNULA ANT/CHMB 27GA (MISCELLANEOUS) ×3 IMPLANT
CARTRIDGE ABBOTT (MISCELLANEOUS) IMPLANT
GLOVE SURG LX 7.5 STRW (GLOVE) ×2
GLOVE SURG LX STRL 7.5 STRW (GLOVE) ×1 IMPLANT
GLOVE SURG TRIUMPH 8.0 PF LTX (GLOVE) ×3 IMPLANT
GOWN STRL REUS W/ TWL LRG LVL3 (GOWN DISPOSABLE) ×2 IMPLANT
GOWN STRL REUS W/TWL LRG LVL3 (GOWN DISPOSABLE) ×4
LENS IOL ACRSF IQ ULTRA 24.5 (Intraocular Lens) IMPLANT
LENS IOL ACRYSOF IQ 24.5 (Intraocular Lens) ×3 IMPLANT
MARKER SKIN DUAL TIP RULER LAB (MISCELLANEOUS) ×3 IMPLANT
NDL FILTER BLUNT 18X1 1/2 (NEEDLE) ×1 IMPLANT
NDL RETROBULBAR .5 NSTRL (NEEDLE) IMPLANT
NEEDLE FILTER BLUNT 18X 1/2SAF (NEEDLE) ×2
NEEDLE FILTER BLUNT 18X1 1/2 (NEEDLE) ×1 IMPLANT
PACK CATARACT BRASINGTON (MISCELLANEOUS) ×3 IMPLANT
PACK EYE AFTER SURG (MISCELLANEOUS) ×3 IMPLANT
PACK OPTHALMIC (MISCELLANEOUS) ×3 IMPLANT
RING MALYGIN 7.0 (MISCELLANEOUS) IMPLANT
SUT ETHILON 10-0 CS-B-6CS-B-6 (SUTURE)
SUT VICRYL  9 0 (SUTURE)
SUT VICRYL 9 0 (SUTURE) IMPLANT
SUTURE EHLN 10-0 CS-B-6CS-B-6 (SUTURE) IMPLANT
SYR 3ML LL SCALE MARK (SYRINGE) ×3 IMPLANT
SYR 5ML LL (SYRINGE) ×3 IMPLANT
SYR TB 1ML LUER SLIP (SYRINGE) ×3 IMPLANT
WATER STERILE IRR 500ML POUR (IV SOLUTION) ×3 IMPLANT
WIPE NON LINTING 3.25X3.25 (MISCELLANEOUS) ×3 IMPLANT

## 2018-05-06 NOTE — Anesthesia Postprocedure Evaluation (Signed)
Anesthesia Post Note  Patient: Margaret Townsend  Procedure(s) Performed: CATARACT EXTRACTION PHACO AND INTRAOCULAR LENS PLACEMENT ([place]) LEFT (Left Eye)  Patient location during evaluation: PACU Anesthesia Type: MAC Level of consciousness: awake and alert Pain management: pain level controlled Vital Signs Assessment: post-procedure vital signs reviewed and stable Respiratory status: spontaneous breathing, nonlabored ventilation, respiratory function stable and patient connected to nasal cannula oxygen Cardiovascular status: stable and blood pressure returned to baseline Postop Assessment: no apparent nausea or vomiting Anesthetic complications: no    Yoni Lobos

## 2018-05-06 NOTE — Anesthesia Procedure Notes (Signed)
Procedure Name: MAC Date/Time: 05/06/2018 10:13 AM Performed by: Janna Arch, CRNA Pre-anesthesia Checklist: Patient identified, Emergency Drugs available, Suction available and Patient being monitored Patient Re-evaluated:Patient Re-evaluated prior to induction Oxygen Delivery Method: Nasal cannula

## 2018-05-06 NOTE — Anesthesia Preprocedure Evaluation (Signed)
Anesthesia Evaluation  Patient identified by MRN, date of birth, ID band  Reviewed: NPO status   History of Anesthesia Complications Negative for: history of anesthetic complications  Airway Mallampati: II  TM Distance: >3 FB Neck ROM: full    Dental no notable dental hx.    Pulmonary neg pulmonary ROS,    Pulmonary exam normal        Cardiovascular Exercise Tolerance: Good hypertension, Normal cardiovascular exam     Neuro/Psych  Headaches, negative psych ROS   GI/Hepatic Neg liver ROS, GERD  Controlled,  Endo/Other  Hypothyroidism   Renal/GU negative Renal ROS  negative genitourinary   Musculoskeletal  (+) Arthritis ,   Abdominal   Peds  Hematology negative hematology ROS (+) R breast cancer (2015) > mastectomy.  No IV, NO BP on R arm.   Anesthesia Other Findings   Reproductive/Obstetrics                             Anesthesia Physical Anesthesia Plan  ASA: II  Anesthesia Plan: MAC   Post-op Pain Management:    Induction:   PONV Risk Score and Plan:   Airway Management Planned:   Additional Equipment:   Intra-op Plan:   Post-operative Plan:   Informed Consent: I have reviewed the patients History and Physical, chart, labs and discussed the procedure including the risks, benefits and alternatives for the proposed anesthesia with the patient or authorized representative who has indicated his/her understanding and acceptance.     Plan Discussed with: CRNA  Anesthesia Plan Comments:         Anesthesia Quick Evaluation

## 2018-05-06 NOTE — H&P (Signed)
The History and Physical notes are on paper, have been signed, and are to be scanned. The patient remains stable and unchanged from the H&P.   Previous H&P reviewed, patient examined, and there are no changes.  Margaret Townsend 05/06/2018 9:34 AM

## 2018-05-06 NOTE — Op Note (Signed)
OPERATIVE NOTE  Margaret Townsend [id number] 05/06/2018   PREOPERATIVE DIAGNOSIS:  Nuclear sclerotic cataract left eye. H25.12   POSTOPERATIVE DIAGNOSIS:    Nuclear sclerotic cataract left eye.     PROCEDURE:  Phacoemusification with posterior chamber intraocular lens placement of the left eye   LENS:  * No implants in log * [id number] 24.5 PCIOL Alcon   ULTRASOUND TIME: 13  % of 0 minutes 56 seconds, CDE 7.3  SURGEON:  Wyonia Hough, MD   ANESTHESIA:  Topical with tetracaine drops and 2% Xylocaine jelly, augmented with 1% preservative-free intracameral lidocaine.    COMPLICATIONS:  None.   DESCRIPTION OF PROCEDURE:  The patient was identified in the holding room and transported to the operating room and placed in the supine position under the operating microscope.  The left eye was identified as the operative eye and it was prepped and draped in the usual sterile ophthalmic fashion.   A 1 millimeter clear-corneal paracentesis was made at the 1:30 position.  0.5 ml of preservative-free 1% lidocaine was injected into the anterior chamber.  The anterior chamber was filled with Viscoat viscoelastic.  A 2.4 millimeter keratome was used to make a near-clear corneal incision at the 10:30 position.  .  A curvilinear capsulorrhexis was made with a cystotome and capsulorrhexis forceps.  Balanced salt solution was used to hydrodissect and hydrodelineate the nucleus.   Phacoemulsification was then used in stop and chop fashion to remove the lens nucleus and epinucleus.  The remaining cortex was then removed using the irrigation and aspiration handpiece. Provisc was then placed into the capsular bag to distend it for lens placement.  A lens was then injected into the capsular bag.  The remaining viscoelastic was aspirated.   Wounds were hydrated with balanced salt solution.  The anterior chamber was inflated to a physiologic pressure with balanced salt solution.  No wound leaks were noted.  Cefuroxime 0.1 ml of a 10mg /ml solution was injected into the anterior chamber for a dose of 1 mg of intracameral antibiotic at the completion of the case.   Timolol and Brimonidine drops were applied to the eye.  The patient was taken to the recovery room in stable condition without complications of anesthesia or surgery.  [name] 05/06/2018, 10:27 AM

## 2018-05-06 NOTE — Transfer of Care (Signed)
Immediate Anesthesia Transfer of Care Note  Patient: Margaret Townsend  Procedure(s) Performed: CATARACT EXTRACTION PHACO AND INTRAOCULAR LENS PLACEMENT ([facility]) LEFT (Left Eye)  Patient Location: PACU  Anesthesia Type: MAC  Level of Consciousness: awake, alert  and patient cooperative  Airway and Oxygen Therapy: Patient Spontanous Breathing and Patient connected to supplemental oxygen  Post-op Assessment: Post-op Vital signs reviewed, Patient's Cardiovascular Status Stable, Respiratory Function Stable, Patent Airway and No signs of Nausea or vomiting  Post-op Vital Signs: Reviewed and stable  Complications: No apparent anesthesia complications

## 2018-05-07 ENCOUNTER — Encounter: Payer: Self-pay | Admitting: Ophthalmology

## 2018-05-14 DIAGNOSIS — H2511 Age-related nuclear cataract, right eye: Secondary | ICD-10-CM | POA: Diagnosis not present

## 2018-05-15 NOTE — Discharge Instructions (Signed)

## 2018-05-20 ENCOUNTER — Ambulatory Visit: Payer: Medicare HMO | Admitting: Anesthesiology

## 2018-05-20 ENCOUNTER — Ambulatory Visit
Admission: RE | Admit: 2018-05-20 | Discharge: 2018-05-20 | Disposition: A | Discharge status: Home / Self Care | Payer: Medicare HMO | Source: Ambulatory Visit | Attending: Ophthalmology | Admitting: Ophthalmology

## 2018-05-20 ENCOUNTER — Encounter: Payer: Self-pay | Admitting: *Deleted

## 2018-05-20 ENCOUNTER — Encounter
Admission: RE | Disposition: A | Discharge status: Home / Self Care | Payer: Self-pay | Source: Ambulatory Visit | Attending: Ophthalmology

## 2018-05-20 DIAGNOSIS — I10 Essential (primary) hypertension: Secondary | ICD-10-CM | POA: Insufficient documentation

## 2018-05-20 DIAGNOSIS — Z880 Allergy status to penicillin: Secondary | ICD-10-CM | POA: Insufficient documentation

## 2018-05-20 DIAGNOSIS — Z881 Allergy status to other antibiotic agents status: Secondary | ICD-10-CM | POA: Insufficient documentation

## 2018-05-20 DIAGNOSIS — K219 Gastro-esophageal reflux disease without esophagitis: Secondary | ICD-10-CM | POA: Insufficient documentation

## 2018-05-20 DIAGNOSIS — Z885 Allergy status to narcotic agent status: Secondary | ICD-10-CM | POA: Diagnosis not present

## 2018-05-20 DIAGNOSIS — H2511 Age-related nuclear cataract, right eye: Secondary | ICD-10-CM | POA: Diagnosis not present

## 2018-05-20 DIAGNOSIS — E039 Hypothyroidism, unspecified: Secondary | ICD-10-CM | POA: Diagnosis not present

## 2018-05-20 DIAGNOSIS — H25811 Combined forms of age-related cataract, right eye: Secondary | ICD-10-CM | POA: Diagnosis not present

## 2018-05-20 DIAGNOSIS — Z853 Personal history of malignant neoplasm of breast: Secondary | ICD-10-CM | POA: Insufficient documentation

## 2018-05-20 HISTORY — DX: Gastro-esophageal reflux disease without esophagitis: K21.9

## 2018-05-20 HISTORY — PX: CATARACT EXTRACTION W/PHACO: SHX586

## 2018-05-20 SURGERY — PHACOEMULSIFICATION, CATARACT, WITH IOL INSERTION
Anesthesia: Monitor Anesthesia Care | Laterality: Right | Wound class: "Clean "

## 2018-05-20 MED ORDER — ACETAMINOPHEN 160 MG/5ML PO SOLN: 325.0000 mg | ORAL | Status: DC | PRN

## 2018-05-20 MED ORDER — ACETAMINOPHEN 325 MG PO TABS: 325.0000 mg | ORAL_TABLET | ORAL | Status: DC | PRN

## 2018-05-20 MED ORDER — BRIMONIDINE TARTRATE-TIMOLOL 0.2-0.5 % OP SOLN
OPHTHALMIC | Status: DC | PRN
  Administered 2018-05-20: 1 [drp] via OPHTHALMIC

## 2018-05-20 MED ORDER — NA HYALUR & NA CHOND-NA HYALUR 0.4-0.35 ML IO KIT
PACK | INTRAOCULAR | Status: DC | PRN
  Administered 2018-05-20: 1 mL via INTRAOCULAR

## 2018-05-20 MED ORDER — CEFUROXIME OPHTHALMIC INJECTION 1 MG/0.1 ML
INJECTION | OPHTHALMIC | Status: DC | PRN
  Administered 2018-05-20: 0.1 mL via INTRACAMERAL

## 2018-05-20 MED ORDER — MIDAZOLAM HCL 2 MG/2ML IJ SOLN
INTRAMUSCULAR | Status: DC | PRN
  Administered 2018-05-20: 1 mg via INTRAVENOUS

## 2018-05-20 MED ORDER — ARMC OPHTHALMIC DILATING DROPS
1.0000 "application " | OPHTHALMIC | Status: DC | PRN
  Administered 2018-05-20 (×3): 1 via OPHTHALMIC

## 2018-05-20 MED ORDER — LACTATED RINGERS IV SOLN: INTRAVENOUS | Status: DC

## 2018-05-20 MED ORDER — LIDOCAINE HCL (PF) 2 % IJ SOLN
INTRAOCULAR | Status: DC | PRN
  Administered 2018-05-20: 1 mL via INTRAMUSCULAR

## 2018-05-20 MED ORDER — POLYMYXIN B-TRIMETHOPRIM 10000-0.1 UNIT/ML-% OP SOLN
1.0000 [drp] | OPHTHALMIC | Status: DC | PRN
  Administered 2018-05-20 (×3): 1 [drp] via OPHTHALMIC

## 2018-05-20 MED ORDER — FENTANYL CITRATE (PF) 100 MCG/2ML IJ SOLN
INTRAMUSCULAR | Status: DC | PRN
  Administered 2018-05-20: 50 ug via INTRAVENOUS

## 2018-05-20 SURGICAL SUPPLY — 27 items
CANNULA ANT/CHMB 27G (MISCELLANEOUS) ×1 IMPLANT
CANNULA ANT/CHMB 27GA (MISCELLANEOUS) ×3 IMPLANT
CARTRIDGE ABBOTT (MISCELLANEOUS) IMPLANT
GLOVE SURG LX 7.5 STRW (GLOVE) ×2
GLOVE SURG LX STRL 7.5 STRW (GLOVE) ×1 IMPLANT
GLOVE SURG TRIUMPH 8.0 PF LTX (GLOVE) ×3 IMPLANT
GOWN STRL REUS W/ TWL LRG LVL3 (GOWN DISPOSABLE) ×2 IMPLANT
GOWN STRL REUS W/TWL LRG LVL3 (GOWN DISPOSABLE) ×4
MARKER SKIN DUAL TIP RULER LAB (MISCELLANEOUS) ×3 IMPLANT
NDL FILTER BLUNT 18X1 1/2 (NEEDLE) ×1 IMPLANT
NDL RETROBULBAR .5 NSTRL (NEEDLE) IMPLANT
NEEDLE FILTER BLUNT 18X 1/2SAF (NEEDLE) ×2
NEEDLE FILTER BLUNT 18X1 1/2 (NEEDLE) ×1 IMPLANT
PACK CATARACT BRASINGTON (MISCELLANEOUS) ×3 IMPLANT
PACK EYE AFTER SURG (MISCELLANEOUS) ×3 IMPLANT
PACK OPTHALMIC (MISCELLANEOUS) ×3 IMPLANT
RING MALYGIN 7.0 (MISCELLANEOUS) IMPLANT
SUT ETHILON 10-0 CS-B-6CS-B-6 (SUTURE)
SUT VICRYL  9 0 (SUTURE)
SUT VICRYL 9 0 (SUTURE) IMPLANT
SUTURE EHLN 10-0 CS-B-6CS-B-6 (SUTURE) IMPLANT
SYR 3ML LL SCALE MARK (SYRINGE) ×3 IMPLANT
SYR 5ML LL (SYRINGE) ×3 IMPLANT
SYR TB 1ML LUER SLIP (SYRINGE) ×3 IMPLANT
WATER STERILE IRR 500ML POUR (IV SOLUTION) ×3 IMPLANT
WIPE NON LINTING 3.25X3.25 (MISCELLANEOUS) ×3 IMPLANT
acrysof IQ IOL (Intraocular Lens) ×2 IMPLANT

## 2018-05-20 NOTE — Anesthesia Procedure Notes (Signed)
Procedure Name: [place] Performed by: Cameron Ali, CRNA Pre-anesthesia Checklist: Patient identified, Emergency Drugs available, Suction available, Timeout performed and Patient being monitored Patient Re-evaluated:Patient Re-evaluated prior to induction Oxygen Delivery Method: Nasal cannula Placement Confirmation: positive ETCO2

## 2018-05-20 NOTE — Op Note (Signed)
LOCATION:  [place]   PREOPERATIVE DIAGNOSIS:  Nuclear sclerotic cataract of the right eye.  H25.11   POSTOPERATIVE DIAGNOSIS:  Nuclear sclerotic cataract of the right eye.   PROCEDURE:  Phacoemulsification with Toric posterior chamber intraocular lens placement of the right eye.   LENS:   Implant Name Type Inv. Item Serial No. Manufacturer Lot No. LRB No. Used  acrysof IQ IOL Intraocular Lens  [id number] ALCON  Right 1     [id number] 22.5D Toric intraocular lens with 1.5 diopters of cylindrical power with axis orientation at 98 degrees.   ULTRASOUND TIME: 16 % of 1 minutes, 20 seconds.  CDE 12.0   SURGEON:  Wyonia Hough, MD   ANESTHESIA: Topical with tetracaine drops and 2% Xylocaine jelly, augmented with 1% preservative-free intracameral lidocaine. .   COMPLICATIONS:  None.   DESCRIPTION OF PROCEDURE:  The patient was identified in the holding room and transported to the operating suite and placed in the supine position under the operating microscope.  The right eye was identified as the operative eye, and it was prepped and draped in the usual sterile ophthalmic fashion.    A clear-corneal paracentesis incision was made at the 12:00 position.  0.5 ml of preservative-free 1% lidocaine was injected into the anterior chamber. The anterior chamber was filled with Viscoat.  A 2.4 millimeter near clear corneal incision was then made at the 9:00 position.  A cystotome and capsulorrhexis forceps were then used to make a curvilinear capsulorrhexis.  Hydrodissection and hydrodelineation were then performed using balanced salt solution.   Phacoemulsification was then used in stop and chop fashion to remove the lens, nucleus and epinucleus.  The remaining cortex was aspirated using the irrigation and aspiration handpiece.  Provisc viscoelastic was then placed into the capsular bag to distend it for lens placement.  The Verion digital marker was used to align the implant at the  intended axis.   A Toric lens was then injected into the capsular bag.  It was rotated clockwise until the axis marks on the lens were approximately 15 degrees in the counterclockwise direction to the intended alignment.  The viscoelastic was aspirated from the eye using the irrigation aspiration handpiece.  Then, a Koch spatula through the sideport incision was used to rotate the lens in a clockwise direction until the axis markings of the intraocular lens were lined up with the Verion alignment.  Balanced salt solution was then used to hydrate the wounds. Cefuroxime 0.1 ml of a 10mg /ml solution was injected into the anterior chamber for a dose of 1 mg of intracameral antibiotic at the completion of the case.    The eye was noted to have a physiologic pressure and there was no wound leak noted.   Timolol and Brimonidine drops were applied to the eye.  The patient was taken to the recovery room in stable condition having had no complications of anesthesia or surgery.  Wynema Garoutte 05/20/2018, 9:32 AM

## 2018-05-20 NOTE — Transfer of Care (Signed)
Immediate Anesthesia Transfer of Care Note  Patient: Margaret Townsend  Procedure(s) Performed: CATARACT EXTRACTION PHACO AND INTRAOCULAR LENS PLACEMENT ([facility]) RIGHT  TORIC (Right )  Patient Location: PACU  Anesthesia Type: General, MAC  Level of Consciousness: awake, alert  and patient cooperative  Airway and Oxygen Therapy: Patient Spontanous Breathing and Patient connected to supplemental oxygen  Post-op Assessment: Post-op Vital signs reviewed, Patient's Cardiovascular Status Stable, Respiratory Function Stable, Patent Airway and No signs of Nausea or vomiting  Post-op Vital Signs: Reviewed and stable  Complications: No apparent anesthesia complications

## 2018-05-20 NOTE — H&P (Signed)
The History and Physical notes are on paper, have been signed, and are to be scanned. The patient remains stable and unchanged from the H&P.   Previous H&P reviewed, patient examined, and there are no changes.  Margaret Townsend 05/20/2018 8:35 AM

## 2018-05-20 NOTE — Anesthesia Preprocedure Evaluation (Signed)
Anesthesia Evaluation  Patient identified by MRN, date of birth, ID band Patient awake    Reviewed: Allergy & Precautions, H&P , NPO status , Patient's Chart, lab work & pertinent test results, reviewed documented beta blocker date and time   Airway Mallampati: I  TM Distance: >3 FB Neck ROM: full    Dental no notable dental hx.    Pulmonary neg pulmonary ROS,    Pulmonary exam normal breath sounds clear to auscultation       Cardiovascular Exercise Tolerance: Good hypertension, Normal cardiovascular exam Rhythm:regular Rate:Normal     Neuro/Psych negative neurological ROS  negative psych ROS   GI/Hepatic Neg liver ROS, GERD  ,  Endo/Other  Hypothyroidism   Renal/GU negative Renal ROS  negative genitourinary   Musculoskeletal   Abdominal   Peds  Hematology negative hematology ROS (+)   Anesthesia Other Findings   Reproductive/Obstetrics negative OB ROS                             Anesthesia Physical Anesthesia Plan  ASA: II  Anesthesia Plan: General and MAC   Post-op Pain Management:    Induction:   PONV Risk Score and Plan:   Airway Management Planned:   Additional Equipment:   Intra-op Plan:   Post-operative Plan:   Informed Consent: I have reviewed the patients History and Physical, chart, labs and discussed the procedure including the risks, benefits and alternatives for the proposed anesthesia with the patient or authorized representative who has indicated his/her understanding and acceptance.   Dental Advisory Given  Plan Discussed with: CRNA  Anesthesia Plan Comments:         Anesthesia Quick Evaluation

## 2018-05-20 NOTE — Anesthesia Postprocedure Evaluation (Signed)
Anesthesia Post Note  Patient: Margaret Townsend  Procedure(s) Performed: CATARACT EXTRACTION PHACO AND INTRAOCULAR LENS PLACEMENT ([facility]) RIGHT  TORIC (Right )  Patient location during evaluation: PACU Anesthesia Type: MAC Level of consciousness: awake and alert Pain management: pain level controlled Vital Signs Assessment: post-procedure vital signs reviewed and stable Respiratory status: spontaneous breathing, nonlabored ventilation, respiratory function stable and patient connected to nasal cannula oxygen Cardiovascular status: blood pressure returned to baseline and stable Postop Assessment: no apparent nausea or vomiting Anesthetic complications: no    Trecia Rogers

## 2018-05-21 ENCOUNTER — Encounter: Payer: Self-pay | Admitting: Ophthalmology

## 2018-06-12 DIAGNOSIS — E039 Hypothyroidism, unspecified: Secondary | ICD-10-CM | POA: Diagnosis not present

## 2018-06-12 DIAGNOSIS — I10 Essential (primary) hypertension: Secondary | ICD-10-CM | POA: Diagnosis not present

## 2018-07-24 DIAGNOSIS — E039 Hypothyroidism, unspecified: Secondary | ICD-10-CM | POA: Diagnosis not present

## 2018-07-24 DIAGNOSIS — R7303 Prediabetes: Secondary | ICD-10-CM | POA: Diagnosis not present

## 2018-07-28 DIAGNOSIS — C50911 Malignant neoplasm of unspecified site of right female breast: Secondary | ICD-10-CM | POA: Diagnosis not present

## 2018-07-28 DIAGNOSIS — Z170 Estrogen receptor positive status [ER+]: Secondary | ICD-10-CM | POA: Diagnosis not present

## 2018-07-31 DIAGNOSIS — E039 Hypothyroidism, unspecified: Secondary | ICD-10-CM | POA: Diagnosis not present

## 2018-07-31 DIAGNOSIS — I10 Essential (primary) hypertension: Secondary | ICD-10-CM | POA: Diagnosis not present

## 2018-08-10 DIAGNOSIS — Z780 Asymptomatic menopausal state: Secondary | ICD-10-CM | POA: Diagnosis not present

## 2018-08-17 DIAGNOSIS — I10 Essential (primary) hypertension: Secondary | ICD-10-CM | POA: Diagnosis not present

## 2018-08-17 DIAGNOSIS — R002 Palpitations: Secondary | ICD-10-CM | POA: Diagnosis not present

## 2018-08-25 DIAGNOSIS — R002 Palpitations: Secondary | ICD-10-CM | POA: Diagnosis not present

## 2018-09-04 DIAGNOSIS — R002 Palpitations: Secondary | ICD-10-CM | POA: Diagnosis not present

## 2018-09-04 DIAGNOSIS — E039 Hypothyroidism, unspecified: Secondary | ICD-10-CM | POA: Diagnosis not present

## 2018-09-04 DIAGNOSIS — R0602 Shortness of breath: Secondary | ICD-10-CM | POA: Diagnosis not present

## 2018-09-04 DIAGNOSIS — E782 Mixed hyperlipidemia: Secondary | ICD-10-CM | POA: Diagnosis not present

## 2018-09-04 DIAGNOSIS — I10 Essential (primary) hypertension: Secondary | ICD-10-CM | POA: Diagnosis not present

## 2018-09-08 DIAGNOSIS — C50111 Malignant neoplasm of central portion of right female breast: Secondary | ICD-10-CM | POA: Diagnosis not present

## 2018-09-08 DIAGNOSIS — Z4431 Encounter for fitting and adjustment of external right breast prosthesis: Secondary | ICD-10-CM | POA: Diagnosis not present

## 2018-09-09 ENCOUNTER — Encounter: Payer: Self-pay | Admitting: Nurse Practitioner

## 2018-09-09 ENCOUNTER — Ambulatory Visit (INDEPENDENT_AMBULATORY_CARE_PROVIDER_SITE_OTHER): Payer: Self-pay | Admitting: Nurse Practitioner

## 2018-09-09 VITALS — BP 158/65 | HR 90 | Temp 98.4°F | Ht 65.0 in | Wt 168.8 lb

## 2018-09-09 DIAGNOSIS — R5382 Chronic fatigue, unspecified: Secondary | ICD-10-CM | POA: Diagnosis not present

## 2018-09-09 DIAGNOSIS — Z23 Encounter for immunization: Secondary | ICD-10-CM

## 2018-09-09 DIAGNOSIS — E039 Hypothyroidism, unspecified: Secondary | ICD-10-CM | POA: Diagnosis not present

## 2018-09-09 DIAGNOSIS — R42 Dizziness and giddiness: Secondary | ICD-10-CM

## 2018-09-09 DIAGNOSIS — Z7689 Persons encountering health services in other specified circumstances: Secondary | ICD-10-CM

## 2018-09-09 DIAGNOSIS — H8102 Meniere's disease, left ear: Secondary | ICD-10-CM

## 2018-09-09 LAB — CBC WITH DIFFERENTIAL/PLATELET
Basophils Absolute: 28 cells/uL (ref 0–200)
Basophils Relative: 0.4 %
Eosinophils Absolute: 178 cells/uL (ref 15–500)
Eosinophils Relative: 2.5 %
HCT: 40.6 % (ref 35.0–45.0)
Hemoglobin: 13.6 g/dL (ref 11.7–15.5)
Lymphs Abs: 2208 cells/uL (ref 850–3900)
MCH: 29.8 pg (ref 27.0–33.0)
MCHC: 33.5 g/dL (ref 32.0–36.0)
MCV: 89 fL (ref 80.0–100.0)
MPV: 11.4 fL (ref 7.5–12.5)
Monocytes Relative: 8.5 %
Neutro Abs: 4083 cells/uL (ref 1500–7800)
Neutrophils Relative %: 57.5 %
Platelets: 262 10*3/uL (ref 140–400)
RBC: 4.56 10*6/uL (ref 3.80–5.10)
RDW: 13.1 % (ref 11.0–15.0)
Total Lymphocyte: 31.1 %
WBC mixed population: 604 cells/uL (ref 200–950)
WBC: 7.1 10*3/uL (ref 3.8–10.8)

## 2018-09-09 LAB — COMPLETE METABOLIC PANEL WITH GFR
AG Ratio: 1.8 (calc) (ref 1.0–2.5)
ALT: 17 U/L (ref 6–29)
AST: 18 U/L (ref 10–35)
Albumin: 4.2 g/dL (ref 3.6–5.1)
Alkaline phosphatase (APISO): 81 U/L (ref 33–130)
BUN: 18 mg/dL (ref 7–25)
CO2: 28 mmol/L (ref 20–32)
Calcium: 9.5 mg/dL (ref 8.6–10.4)
Chloride: 102 mmol/L (ref 98–110)
Creat: 0.78 mg/dL (ref 0.60–0.93)
GFR, Est African American: 89 mL/min/{1.73_m2} (ref >=60)
GFR, Est Non African American: 76 mL/min/{1.73_m2} (ref >=60)
Globulin: 2.4 g/dL (calc) (ref 1.9–3.7)
Glucose, Bld: 98 mg/dL (ref 65–99)
Potassium: 4.1 mmol/L (ref 3.5–5.3)
Sodium: 137 mmol/L (ref 135–146)
Total Bilirubin: 0.4 mg/dL (ref 0.2–1.2)
Total Protein: 6.6 g/dL (ref 6.1–8.1)

## 2018-09-09 LAB — T4, FREE: Free T4: 1.5 ng/dL (ref 0.8–1.8)

## 2018-09-09 LAB — T3, FREE: T3, Free: 2.9 pg/mL (ref 2.3–4.2)

## 2018-09-09 LAB — TSH: TSH: 0.38 mIU/L — ABNORMAL LOW (ref 0.40–4.50)

## 2018-09-09 MED ORDER — MECLIZINE HCL 12.5 MG PO TABS
12.5000 mg | ORAL_TABLET | Freq: Three times a day (TID) | ORAL | 0 refills | Status: DC | PRN
Start: 2018-09-09 — End: 2019-11-15

## 2018-09-09 MED ORDER — LEVOTHYROXINE SODIUM 112 MCG PO TABS: 112.0000 ug | ORAL_TABLET | Freq: Every day | ORAL | 5 refills | Status: AC

## 2018-09-09 NOTE — Patient Instructions (Addendum)
Margaret Townsend,   Thank you for coming in to clinic today.  1. You have some mild vertigo, from your left ear.   - START  Epley maneuvers twice daily for 2-4 weeks. - START meclizine 1/2 -1 tablet up to  Three times daily as needed for lightheadedness or dizziness. - IF no improvement in 2-4 weeks, call our clinic and we will place an ENT referral or you can call Dr. Tami Ribas.  2. Double check with [facility] that you have brand name Synthroid.  3. Labs today.   Please schedule a follow-up appointment with [name], AGNP. Return in about 3 months (around 12/10/2018) for lightheadeness, thyroid.  If you have any other questions or concerns, please feel free to call the clinic or send a message through [place]. You may also schedule an earlier appointment if necessary.  You will receive a survey after today's visit either digitally by e-mail or paper by [employer]. Your experiences and feedback matter to [place].  Please respond so we know how we are doing as we provide care for you.   [name], DNP, AGNP-BC Adult Gerontology Nurse Practitioner [facility], [facility]    How to Perform the Epley Maneuver The Epley maneuver is an exercise that relieves symptoms of vertigo. Vertigo is the feeling that you or your surroundings are moving when they are not. When you feel vertigo, you may feel like the room is spinning and have trouble walking. Dizziness is a little different than vertigo. When you are dizzy, you may feel unsteady or light-headed. You can do this maneuver at home whenever you have symptoms of vertigo. You can do it up to 3 times a day until your symptoms go away. Even though the Epley maneuver may relieve your vertigo for a few weeks, it is possible that your symptoms will return. This maneuver relieves vertigo, but it does not relieve dizziness. What are the risks? If it is done correctly, the Epley maneuver is considered safe. Sometimes it can lead to  dizziness or nausea that goes away after a short time. If you develop other symptoms, such as changes in vision, weakness, or numbness, stop doing the maneuver and call your health care provider. How to perform the Epley maneuver 1. Sit on the edge of a bed or table with your back straight and your legs extended or hanging over the edge of the bed or table. 2. Turn your head halfway toward the affected ear or side. 3. Lie backward quickly with your head turned until you are lying flat on your back. You may want to position a pillow under your shoulders. 4. Hold this position for 30 seconds. You may experience an attack of vertigo. This is normal. 5. Turn your head to the opposite direction until your unaffected ear is facing the floor. 6. Hold this position for 30 seconds. You may experience an attack of vertigo. This is normal. Hold this position until the vertigo stops. 7. Turn your whole body to the same side as your head. Hold for another 30 seconds. 8. Sit back up. You can repeat this exercise up to 3 times a day. Follow these instructions at home:  After doing the Epley maneuver, you can return to your normal activities.  Ask your health care provider if there is anything you should do at home to prevent vertigo. He or she may recommend that you: ? Keep your head raised (elevated) with two or more pillows while you sleep. ?  Do not sleep on the side of your affected ear. ? Get up slowly from bed. ? Avoid sudden movements during the day. ? Avoid extreme head movement, like looking up or bending over. Contact a health care provider if:  Your vertigo gets worse.  You have other symptoms, including: ? Nausea. ? Vomiting. ? Headache. Get help right away if:  You have vision changes.  You have a severe or worsening headache or neck pain.  You cannot stop vomiting.  You have new numbness or weakness in any part of your body. Summary  Vertigo is the feeling that you or your  surroundings are moving when they are not.  The Epley maneuver is an exercise that relieves symptoms of vertigo.  If the Epley maneuver is done correctly, it is considered safe. You can do it up to 3 times a day. This information is not intended to replace advice given to you by your health care provider. Make sure you discuss any questions you have with your health care provider. Document Released: 11/23/2013 Document Revised: 10/08/2016 Document Reviewed: 10/08/2016 Elsevier Interactive Patient Education  2017 [employer].

## 2018-09-09 NOTE — Progress Notes (Signed)
Subjective:    Patient ID: Margaret Townsend, female    DOB: 06/07/1946, 72 y.o.   MRN: [id number]  Margaret Townsend is a 72 y.o. female presenting on 09/09/2018 for Establish Care (lightheadedness, fatigue. pt f/u  by hr Cardiologist Dr. Ubaldo Glassing, echocardiogram scheduled for 10/22)   HPI Establish Care New Provider Pt last seen by PCP Dr. Candiss Norse about 2 months ago.  Obtain records from [facility]. - Ongoing care by Dr. Ubaldo Glassing with [facility] cardiology.    Fatigue Patient reports new symptoms of fatigue and lightheadedness started when she reduced levothyroxine from 125 mcg to 112 mcg in May with Dr. Candiss Norse.  Had low T3 total in 2017 with fall and was right after thyroid function was giving her trouble.  - Exercise tolerance is unchanged - Denies heart flutters, racing/palpitations, excess energy, weight changes, heat and cold intolerance, changes in hair/skin/nails, and lower leg swelling.  - She does not have any compressive symptoms to include difficulty swallowing, globus sensation, or difficulty breathing when lying flat.   Dizziness Dizziness described as not being truly dizzy - "feels like there isn't enough blood in my head."  This is also associated with balance difficulty, Tinnitus intermittently.  No change in lightheadedness with ringing in ears. - No headaches. - Had cataract surgery in June, doesn't see as well after surgery.  Has followup next month. - Feels like there is lack of balance, after fall and concussion had PT/OT and no significant improvement.  Saw Dr. Tami Ribas after fall and had OT   - Patient has a history of remote vertigo problems.  Past Medical History:  Diagnosis Date  . Breast cancer ([place])   . Cancer (HCC)    breast  . GERD (gastroesophageal reflux disease)   . Headache   . Hypertension   . Hypothyroidism   . Thyroid disease    Past Surgical History:  Procedure Laterality Date  . ABDOMINAL HYSTERECTOMY    . BACK SURGERY  2001   Lumbar  . BREAST  SURGERY    . CATARACT EXTRACTION W/PHACO Left 05/06/2018   Procedure: CATARACT EXTRACTION PHACO AND INTRAOCULAR LENS PLACEMENT ([place]) LEFT;  Surgeon: Leandrew Koyanagi, MD;  Location: [place];  Service: Ophthalmology;  Laterality: Left;  prefers to arrive around 9 am  . CATARACT EXTRACTION W/PHACO Right 05/20/2018   Procedure: CATARACT EXTRACTION PHACO AND INTRAOCULAR LENS PLACEMENT ([place]) RIGHT  TORIC;  Surgeon: Leandrew Koyanagi, MD;  Location: [place];  Service: Ophthalmology;  Laterality: Right;  . CHOLECYSTECTOMY    . DILATION AND CURETTAGE OF UTERUS    . KNEE SURGERY Left 1976  . MASTECTOMY Right 2015   breast cancer only treated surgically and with Letrozole  . TONSILLECTOMY     Social History   Socioeconomic History  . Marital status: Married    Spouse name: Not on file  . Number of children: 2  . Years of education: Not on file  . Highest education level: High school graduate  Occupational History  . Not on file  Social Needs  . Financial resource strain: Not on file  . Food insecurity:    Worry: Not on file    Inability: Not on file  . Transportation needs:    Medical: Not on file    Non-medical: Not on file  Tobacco Use  . Smoking status: Never Smoker  . Smokeless tobacco: Never Used  Substance and Sexual Activity  . Alcohol use: Never    Frequency: Never  .  Drug use: Never  . Sexual activity: Not Currently  Lifestyle  . Physical activity:    Days per week: 4 days    Minutes per session: 30 min  . Stress: Not on file  Relationships  . Social connections:    Talks on phone: Not on file    Gets together: Not on file    Attends religious service: Not on file    Active member of club or organization: Not on file    Attends meetings of clubs or organizations: Not on file    Relationship status: Not on file  . Intimate partner violence:    Fear of current or ex partner: Not on file    Emotionally abused: Not on file    Physically  abused: Not on file    Forced sexual activity: Not on file  Other Topics Concern  . Not on file  Social History Narrative  . Not on file   Family History  Problem Relation Age of Onset  . Stroke Mother 51  . Ovarian cancer Mother   . Pemphigus vulgaris Father   . Aneurysm Sister        Brain  . Hyperlipidemia Daughter   . Thyroid disease Daughter 21   Current Outpatient Medications on File Prior to Visit  Medication Sig  . Ascorbic Acid (VITAMIN C) 1000 MG tablet Take by mouth.  . Cholecalciferol (VITAMIN D3) 1000 units CAPS Take by mouth.  . diphenhydramine-acetaminophen (TYLENOL PM) 25-500 MG TABS tablet Take 1 tablet by mouth at bedtime as needed.  Nyoka Cowden Tea Oil Fragrance OIL by Does not apply route.  Marland Kitchen letrozole (FEMARA) 2.5 MG tablet Take 2.5 mg by mouth at bedtime.   Marland Kitchen losartan-hydrochlorothiazide (HYZAAR) 50-12.5 MG tablet Take by mouth.  Marland Kitchen omeprazole (PRILOSEC) 20 MG capsule Take by mouth as needed.   . vitamin E 400 UNIT capsule Take by mouth.  . Multiple Vitamin (MULTI-VITAMINS) TABS Take by mouth.   No current facility-administered medications on file prior to visit.    Review of Systems  Constitutional: Positive for fatigue. Negative for chills and fever.  HENT: Negative for congestion and sore throat.   Eyes: Negative for pain.  Respiratory: Negative for cough, shortness of breath and wheezing.   Cardiovascular: Negative for chest pain, palpitations and leg swelling.  Gastrointestinal: Negative for abdominal pain, blood in stool, constipation, diarrhea, nausea and vomiting.  Endocrine: Negative for cold intolerance, heat intolerance and polydipsia.  Genitourinary: Negative for dysuria, frequency, hematuria and urgency.  Musculoskeletal: Positive for gait problem (balance difficulty). Negative for back pain, myalgias and neck pain.  Skin: Negative.  Negative for rash.  Allergic/Immunologic: Negative for environmental allergies.  Neurological: Positive for  dizziness and light-headedness. Negative for seizures, speech difficulty, weakness, numbness and headaches.  Hematological: Does not bruise/bleed easily.  Psychiatric/Behavioral: Negative for dysphoric mood and suicidal ideas. The patient is not nervous/anxious.    Per HPI unless specifically indicated above     Objective:    BP (!) 158/65 (BP Location: Left Arm, Patient Position: Sitting, Cuff Size: Normal)   Pulse 90   Temp 98.4 F (36.9 C) (Oral)   Ht 5\' 5"  (1.651 m)   Wt 168 lb 12.8 oz (76.6 kg)   BMI 28.09 kg/m   Wt Readings from Last 3 Encounters:  09/09/18 168 lb 12.8 oz (76.6 kg)  05/20/18 166 lb (75.3 kg)  05/06/18 165 lb (74.8 kg)    Physical Exam  Constitutional: She is oriented to person, place,  and time. She appears well-developed and well-nourished. No distress.  HENT:  Head: Normocephalic and atraumatic.  Right Ear: Hearing, tympanic membrane, external ear and ear canal normal.  Left Ear: Hearing, tympanic membrane, external ear and ear canal normal.  Nose: Nose normal. Right sinus exhibits no maxillary sinus tenderness and no frontal sinus tenderness. Left sinus exhibits no maxillary sinus tenderness and no frontal sinus tenderness.  Mouth/Throat: Uvula is midline, oropharynx is clear and moist and mucous membranes are normal. Tonsils are 0 on the right. Tonsils are 0 on the left.  Neck: No JVD present. No thyromegaly present.  Cardiovascular: Normal rate, regular rhythm, S1 normal, S2 normal, normal heart sounds and intact distal pulses.  Pulmonary/Chest: Effort normal and breath sounds normal. No respiratory distress.  Lymphadenopathy:    She has no cervical adenopathy.  Neurological: She is alert and oriented to person, place, and time. No cranial nerve deficit. She displays a negative Romberg sign. Coordination and gait normal.  Positive Dix-Hallpike with leftward gaze.  Negative to rightward gaze. Negative forward head tilt.  Skin: Skin is warm and dry.  Capillary refill takes less than 2 seconds.  Psychiatric: She has a normal mood and affect. Her behavior is normal. Judgment and thought content normal.  Vitals reviewed.   Results for orders placed or performed during the hospital encounter of 63/78/58  Basic metabolic panel  Result Value Ref Range   Sodium 137 135 - 145 mmol/L   Potassium 3.9 3.5 - 5.1 mmol/L   Chloride 104 101 - 111 mmol/L   CO2 22 22 - 32 mmol/L   Glucose, Bld 113 (H) 65 - 99 mg/dL   BUN 20 6 - 20 mg/dL   Creatinine, Ser 0.89 0.44 - 1.00 mg/dL   Calcium 9.3 8.9 - 10.3 mg/dL   GFR calc non Af Amer >60 >60 mL/min   GFR calc Af Amer >60 >60 mL/min   Anion gap 11 5 - 15  CBC  Result Value Ref Range   WBC 9.6 3.6 - 11.0 K/uL   RBC 4.74 3.80 - 5.20 MIL/uL   Hemoglobin 14.3 12.0 - 16.0 g/dL   HCT 41.9 35.0 - 47.0 %   MCV 88.4 80.0 - 100.0 fL   MCH 30.3 26.0 - 34.0 pg   MCHC 34.2 32.0 - 36.0 g/dL   RDW 14.1 11.5 - 14.5 %   Platelets 233 150 - 440 K/uL      Assessment & Plan:   Problem List Items Addressed This Visit    None    Visit Diagnoses    Encounter to establish care    Previous PCP was at Dr. Candiss Norse at [facility].  Records are reviewed in [place] requested.  Past medical, family, and surgical history reviewed w/ pt.     Lightheadedness     -  Primary   Vertigo, labyrinthine, left     Mild, subacute vertigo.  Patient with pos dix-hallpike leftward gaze.  Past history of vertigo supports recurrence. - Start meclizine - Start Epley maneuvers.   Relevant Medications   meclizine (ANTIVERT) 12.5 MG tablet   Chronic fatigue     Unknown cause for new chronic fatigue.  Possible association with hypothyroidism, but will evaulate other possible causes.  Labs today.  Follow-up after results.   Relevant Orders   COMPLETE METABOLIC PANEL WITH GFR   CBC with Differential/Platelet   T3, free   TSH   T4, free   Acquired hypothyroidism     Last total  T3 low.  NO recent TSH in last 6 weeks.  Most  recent was normal. Free T4. Also add Free T3.  Patient with similar symptoms in past with hypothyroidism.   - Recheck labs - Continue current dose levothyroxine - Followup after labs, 3 months.   Relevant Medications   levothyroxine (SYNTHROID) 112 MCG tablet   Other Relevant Orders   T3, free   TSH   T4, free   Needs flu shot     Pt > age 47.  Needs annual influenza vaccine.  Plan: 1. Administer high dose fluzone today.    Relevant Orders   Flu vaccine HIGH DOSE PF (Fluzone High dose) (Completed)      Meds ordered this encounter  Medications  . levothyroxine (SYNTHROID) 112 MCG tablet    Sig: Take 1 tablet (112 mcg total) by mouth daily before breakfast.    Dispense:  30 tablet    Refill:  5    Brand Name only - please check last fill for Brand vs generic.    Order Specific Question:   Supervising Provider    Answer:   Olin Hauser [[id number]]  . meclizine (ANTIVERT) 12.5 MG tablet    Sig: Take 1 tablet (12.5 mg total) by mouth 3 (three) times daily as needed for dizziness.    Dispense:  30 tablet    Refill:  0    Order Specific Question:   Supervising Provider    Answer:   Olin Hauser [[id number]]   Follow up plan: Return in about 3 months (around 12/10/2018) for lightheadeness, thyroid.  [name], DNP, AGPCNP-BC Adult Gerontology Primary Care Nurse Practitioner [facility] 09/09/2018, 2:54 PM

## 2018-09-18 ENCOUNTER — Other Ambulatory Visit
Admission: RE | Admit: 2018-09-18 | Discharge: 2018-09-18 | Disposition: A | Discharge status: Home / Self Care | Payer: Medicare HMO | Source: Ambulatory Visit | Attending: Cardiology | Admitting: Cardiology

## 2018-09-18 DIAGNOSIS — R7303 Prediabetes: Secondary | ICD-10-CM | POA: Diagnosis not present

## 2018-09-18 DIAGNOSIS — I10 Essential (primary) hypertension: Secondary | ICD-10-CM | POA: Diagnosis not present

## 2018-09-18 DIAGNOSIS — E7800 Pure hypercholesterolemia, unspecified: Secondary | ICD-10-CM | POA: Diagnosis not present

## 2018-09-18 DIAGNOSIS — R079 Chest pain, unspecified: Secondary | ICD-10-CM | POA: Diagnosis not present

## 2018-09-18 DIAGNOSIS — E782 Mixed hyperlipidemia: Secondary | ICD-10-CM | POA: Diagnosis not present

## 2018-09-18 LAB — TROPONIN I: Troponin I: <0.03 ng/mL (ref <0.03)

## 2018-09-29 DIAGNOSIS — R079 Chest pain, unspecified: Secondary | ICD-10-CM | POA: Diagnosis not present

## 2018-10-01 DIAGNOSIS — R0602 Shortness of breath: Secondary | ICD-10-CM | POA: Diagnosis not present

## 2018-10-06 ENCOUNTER — Telehealth: Payer: Self-pay | Admitting: Nurse Practitioner

## 2018-10-06 DIAGNOSIS — E039 Hypothyroidism, unspecified: Secondary | ICD-10-CM | POA: Diagnosis not present

## 2018-10-06 DIAGNOSIS — C50111 Malignant neoplasm of central portion of right female breast: Secondary | ICD-10-CM | POA: Diagnosis not present

## 2018-10-06 DIAGNOSIS — Z4431 Encounter for fitting and adjustment of external right breast prosthesis: Secondary | ICD-10-CM | POA: Diagnosis not present

## 2018-10-06 NOTE — Telephone Encounter (Signed)
Pt recently passed out and was sent to Dr. Ubaldo Glassing.  The labs work showed her TS3 was 35 and he thought her thyroid medication should be increased.  Please call to advise [phone]

## 2018-10-07 NOTE — Telephone Encounter (Signed)
The pt was notified and she agree with the referral. She would like to go with the provider Dr Ubaldo Glassing recommended.  Dr. Mee Hives from [facility].

## 2018-10-07 NOTE — Telephone Encounter (Signed)
Patient's last TSH and free T4 here in clinic were low normal and high normal respectively which means patient could be having palpitations related to her levothyroxine thyroid replacement being too high.  T3 in low normal range may mean she is having difficulty converting T4 to T3 and would need specialist management at that point anyway.   - If patient or any other provider believes at this point she needs more thyroid hormone, I recommend patient see an endocrinologist.  I can make this referral if patient desires.  Syncope or passing out is also possibly neurological in cause.  If needing neurology referral, I can place this for further evaluation.

## 2018-10-22 DIAGNOSIS — Z4431 Encounter for fitting and adjustment of external right breast prosthesis: Secondary | ICD-10-CM | POA: Diagnosis not present

## 2018-10-22 DIAGNOSIS — C50111 Malignant neoplasm of central portion of right female breast: Secondary | ICD-10-CM | POA: Diagnosis not present

## 2018-11-02 DIAGNOSIS — R5383 Other fatigue: Secondary | ICD-10-CM | POA: Diagnosis not present

## 2018-11-02 DIAGNOSIS — E039 Hypothyroidism, unspecified: Secondary | ICD-10-CM | POA: Diagnosis not present

## 2018-11-02 DIAGNOSIS — E538 Deficiency of other specified B group vitamins: Secondary | ICD-10-CM | POA: Diagnosis not present

## 2018-11-10 ENCOUNTER — Telehealth: Payer: Self-pay | Admitting: Nurse Practitioner

## 2018-11-10 DIAGNOSIS — I10 Essential (primary) hypertension: Secondary | ICD-10-CM

## 2018-11-10 MED ORDER — LOSARTAN POTASSIUM-HCTZ 50-12.5 MG PO TABS: 1.0000 | ORAL_TABLET | Freq: Every day | ORAL | 1 refills | Status: DC

## 2018-11-10 NOTE — Telephone Encounter (Signed)
Pt. called requesting a refill on losartan 50 12.5 mg called into [address](pt states she only have 1 pill left) Pt. Call back # is   [phone]

## 2018-12-10 ENCOUNTER — Encounter: Payer: Self-pay | Admitting: Nurse Practitioner

## 2018-12-10 ENCOUNTER — Ambulatory Visit (INDEPENDENT_AMBULATORY_CARE_PROVIDER_SITE_OTHER): Payer: Medicare HMO | Admitting: Nurse Practitioner

## 2018-12-10 ENCOUNTER — Ambulatory Visit: Payer: Self-pay | Admitting: Nurse Practitioner

## 2018-12-10 VITALS — BP 136/71 | HR 77 | Temp 98.6°F | Resp 17 | Ht 65.0 in | Wt 168.6 lb

## 2018-12-10 DIAGNOSIS — H9313 Tinnitus, bilateral: Secondary | ICD-10-CM | POA: Diagnosis not present

## 2018-12-10 DIAGNOSIS — R42 Dizziness and giddiness: Secondary | ICD-10-CM | POA: Diagnosis not present

## 2018-12-10 DIAGNOSIS — E538 Deficiency of other specified B group vitamins: Secondary | ICD-10-CM | POA: Diagnosis not present

## 2018-12-10 NOTE — Progress Notes (Signed)
Subjective:    Patient ID: Margaret Townsend, female    DOB: 01/30/1946, 73 y.o.   MRN: [id number]  Margaret Townsend is a 73 y.o. female presenting on 12/10/2018 for Dizziness (3 mths. Pt think her dizziness is worsen and now she comlians of tinnitis.) and Hypothyroidism (pt currently taking b12 complex , but concern that the tablet want get her levels up. Pt currently followed by endocrinology)   HPI Dizziness Increasing dizziness and new tinnitus.   - Concern for B12 deficiency.  Patient has been taking 1,000 units daily, but this is causing indigestion.    - Dr Wallace Going visits have not been helpful.  Has completed OT and PT. - Patient has not had any more congestion, but notes ear itching and "roaring," Eyes watering and itching as well.   - Patient is taking meclizine only as needed, 2-3 times per week is when patient is actually taking this medication. - For hypothyroidism, patient is taking levothyroxine 112 mcg at this time, next endocrinology follow-up in March.  She did not change dose and plans to get B12 at goals prior to increasing dose of levothyroxine. PATIENT has started taking PO B12 as above.  Social History   Tobacco Use  . Smoking status: Never Smoker  . Smokeless tobacco: Never Used  Substance Use Topics  . Alcohol use: Never    Frequency: Never  . Drug use: Never    Review of Systems Per HPI unless specifically indicated above     Objective:    BP 136/71 (BP Location: Right Arm, Patient Position: Sitting, Cuff Size: Normal)   Pulse 77   Temp 98.6 F (37 C) (Oral)   Resp 17   Ht 5\' 5"  (1.651 m)   Wt 168 lb 9.6 oz (76.5 kg)   BMI 28.06 kg/m   Wt Readings from Last 3 Encounters:  12/10/18 168 lb 9.6 oz (76.5 kg)  09/09/18 168 lb 12.8 oz (76.6 kg)  05/20/18 166 lb (75.3 kg)    Physical Exam Vitals signs reviewed.  Constitutional:      General: She is not in acute distress.    Appearance: She is well-developed.  HENT:     Head: Normocephalic and  atraumatic.     Right Ear: Tympanic membrane, ear canal and external ear normal. There is no impacted cerumen.     Left Ear: Tympanic membrane, ear canal and external ear normal. There is no impacted cerumen.     Nose: Nose normal.     Mouth/Throat:     Mouth: Mucous membranes are moist.     Pharynx: Oropharynx is clear.  Eyes:     Conjunctiva/sclera: Conjunctivae normal.     Pupils: Pupils are equal, round, and reactive to light.  Cardiovascular:     Rate and Rhythm: Normal rate and regular rhythm.     Pulses:          Radial pulses are 2+ on the right side and 2+ on the left side.       Posterior tibial pulses are 1+ on the right side and 1+ on the left side.     Heart sounds: Normal heart sounds, S1 normal and S2 normal.  Pulmonary:     Effort: Pulmonary effort is normal. No respiratory distress.     Breath sounds: Normal breath sounds and air entry.  Musculoskeletal:     Right lower leg: No edema.     Left lower leg: No edema.  Skin:    General:  Skin is warm and dry.     Capillary Refill: Capillary refill takes less than 2 seconds.  Neurological:     Mental Status: She is alert and oriented to person, place, and time.     Cranial Nerves: No cranial nerve deficit.     Sensory: No sensory deficit.     Gait: Gait normal.     Deep Tendon Reflexes: Reflexes are normal and symmetric.     Comments:  Negative forward head tilt, dix hallpike maneuvers.  Psychiatric:        Attention and Perception: Attention normal.        Mood and Affect: Mood and affect normal.        Behavior: Behavior normal. Behavior is cooperative.        Thought Content: Thought content normal.        Judgment: Judgment normal.      Results for orders placed or performed during the hospital encounter of 09/18/18  Troponin I  Result Value Ref Range   Troponin I <0.03 <0.03 ng/mL      Assessment & Plan:   Problem List Items Addressed This Visit    None    Visit Diagnoses    Vitamin B12 deficiency     -  Primary   Relevant Orders   B12 (Completed)   Dizziness       Tinnitus of both ears          # B12 deficiency Now with oral replacement no recent recheck.  Labs today.  Follow-up as needed after labs for B12 injections.  If improved levels, will not need injection.  # Dizziness/Tinnitus Acute on chronic problem for patient.  Likely congestion is cause for worsening dizziness/tinnitus.   - Start antihistamine and intranasal steroid.  Consider return to ENT for vestibular therapy repeat.   - Continue meclizine as needed in future. - Continue home Epley maneuvers. - Follow-up prn 2-4 weeks here or with ENT.  Follow up plan: Return 2-4 weeks if symptoms worsen or fail to improve.  [name], DNP, AGPCNP-BC Adult Gerontology Primary Care Nurse Practitioner [facility] 12/10/2018, 11:52 AM

## 2018-12-10 NOTE — Patient Instructions (Addendum)
Margaret Townsend,   Thank you for coming in to clinic today.  1. Return to clinic for B12 labs for collection.  We may   2. Start nasal fluticasone (Flonase or Nasacort) 2 sprays each nostril once daily for 2-6 weeks. - START antihistamine cetirizine (Zyrtec) or loratadine (Claritin) 10 mg once daily.  Please schedule a follow-up appointment with [name], AGNP. Return 2-4 weeks if symptoms worsen or fail to improve.  If you have any other questions or concerns, please feel free to call the clinic or send a message through [place]. You may also schedule an earlier appointment if necessary.  You will receive a survey after today's visit either digitally by e-mail or paper by [employer]. Your experiences and feedback matter to [place].  Please respond so we know how we are doing as we provide care for you.   [name], DNP, AGNP-BC Adult Gerontology Nurse Practitioner [facility], [facility]    How to Perform the Epley Maneuver The Epley maneuver is an exercise that relieves symptoms of vertigo. Vertigo is the feeling that you or your surroundings are moving when they are not. When you feel vertigo, you may feel like the room is spinning and have trouble walking. Dizziness is a little different than vertigo. When you are dizzy, you may feel unsteady or light-headed. You can do this maneuver at home whenever you have symptoms of vertigo. You can do it up to 3 times a day until your symptoms go away. Even though the Epley maneuver may relieve your vertigo for a few weeks, it is possible that your symptoms will return. This maneuver relieves vertigo, but it does not relieve dizziness. What are the risks? If it is done correctly, the Epley maneuver is considered safe. Sometimes it can lead to dizziness or nausea that goes away after a short time. If you develop other symptoms, such as changes in vision, weakness, or numbness, stop doing the maneuver and call your health care  provider. How to perform the Epley maneuver 1. Sit on the edge of a bed or table with your back straight and your legs extended or hanging over the edge of the bed or table. 2. Turn your head halfway toward the affected ear or side. 3. Lie backward quickly with your head turned until you are lying flat on your back. You may want to position a pillow under your shoulders. 4. Hold this position for 30 seconds. You may experience an attack of vertigo. This is normal. 5. Turn your head to the opposite direction until your unaffected ear is facing the floor. 6. Hold this position for 30 seconds. You may experience an attack of vertigo. This is normal. Hold this position until the vertigo stops. 7. Turn your whole body to the same side as your head. Hold for another 30 seconds. 8. Sit back up. You can repeat this exercise up to 3 times a day. Follow these instructions at home:  After doing the Epley maneuver, you can return to your normal activities.  Ask your health care provider if there is anything you should do at home to prevent vertigo. He or she may recommend that you: ? Keep your head raised (elevated) with two or more pillows while you sleep. ? Do not sleep on the side of your affected ear. ? Get up slowly from bed. ? Avoid sudden movements during the day. ? Avoid extreme head movement, like looking up or bending over. Contact a health care  provider if:  Your vertigo gets worse.  You have other symptoms, including: ? Nausea. ? Vomiting. ? Headache. Get help right away if:  You have vision changes.  You have a severe or worsening headache or neck pain.  You cannot stop vomiting.  You have new numbness or weakness in any part of your body. Summary  Vertigo is the feeling that you or your surroundings are moving when they are not.  The Epley maneuver is an exercise that relieves symptoms of vertigo.  If the Epley maneuver is done correctly, it is considered safe. You can  do it up to 3 times a day. This information is not intended to replace advice given to you by your health care provider. Make sure you discuss any questions you have with your health care provider. Document Released: 11/23/2013 Document Revised: 10/08/2016 Document Reviewed: 10/08/2016 Elsevier Interactive Patient Education  2019 [employer].

## 2018-12-11 DIAGNOSIS — E538 Deficiency of other specified B group vitamins: Secondary | ICD-10-CM | POA: Diagnosis not present

## 2018-12-12 LAB — VITAMIN B12: Vitamin B-12: 422 pg/mL (ref 200–1100)

## 2018-12-15 ENCOUNTER — Telehealth: Payer: Self-pay | Admitting: Nurse Practitioner

## 2018-12-15 NOTE — Telephone Encounter (Signed)
Pt called for lab results [phone]

## 2018-12-16 NOTE — Telephone Encounter (Signed)
See result note.  

## 2018-12-16 NOTE — Progress Notes (Signed)
Normal B12 today.  Continue oral replacement only for now.  Take 1,000 mg once daily.

## 2018-12-17 ENCOUNTER — Encounter: Payer: Self-pay | Admitting: Nurse Practitioner

## 2018-12-28 DIAGNOSIS — Z961 Presence of intraocular lens: Secondary | ICD-10-CM | POA: Diagnosis not present

## 2019-01-18 DIAGNOSIS — Z90721 Acquired absence of ovaries, unilateral: Secondary | ICD-10-CM | POA: Diagnosis not present

## 2019-01-18 DIAGNOSIS — Z79899 Other long term (current) drug therapy: Secondary | ICD-10-CM | POA: Diagnosis not present

## 2019-01-18 DIAGNOSIS — C50911 Malignant neoplasm of unspecified site of right female breast: Secondary | ICD-10-CM | POA: Diagnosis not present

## 2019-01-18 DIAGNOSIS — Z885 Allergy status to narcotic agent status: Secondary | ICD-10-CM | POA: Diagnosis not present

## 2019-01-18 DIAGNOSIS — Z170 Estrogen receptor positive status [ER+]: Secondary | ICD-10-CM | POA: Diagnosis not present

## 2019-01-18 DIAGNOSIS — R922 Inconclusive mammogram: Secondary | ICD-10-CM | POA: Diagnosis not present

## 2019-01-18 DIAGNOSIS — Z91041 Radiographic dye allergy status: Secondary | ICD-10-CM | POA: Diagnosis not present

## 2019-01-18 DIAGNOSIS — Z853 Personal history of malignant neoplasm of breast: Secondary | ICD-10-CM | POA: Diagnosis not present

## 2019-01-18 DIAGNOSIS — Z9011 Acquired absence of right breast and nipple: Secondary | ICD-10-CM | POA: Diagnosis not present

## 2019-01-18 DIAGNOSIS — Z8041 Family history of malignant neoplasm of ovary: Secondary | ICD-10-CM | POA: Diagnosis not present

## 2019-01-18 DIAGNOSIS — Z79811 Long term (current) use of aromatase inhibitors: Secondary | ICD-10-CM | POA: Diagnosis not present

## 2019-01-18 DIAGNOSIS — Z08 Encounter for follow-up examination after completed treatment for malignant neoplasm: Secondary | ICD-10-CM | POA: Diagnosis not present

## 2019-01-26 ENCOUNTER — Encounter: Payer: Self-pay | Admitting: Nurse Practitioner

## 2019-01-26 ENCOUNTER — Other Ambulatory Visit: Payer: Self-pay

## 2019-01-26 ENCOUNTER — Ambulatory Visit (INDEPENDENT_AMBULATORY_CARE_PROVIDER_SITE_OTHER): Payer: Medicare HMO | Admitting: Nurse Practitioner

## 2019-01-26 VITALS — BP 130/61 | HR 75 | Temp 98.2°F | Ht 65.0 in | Wt 171.8 lb

## 2019-01-26 DIAGNOSIS — E782 Mixed hyperlipidemia: Secondary | ICD-10-CM | POA: Diagnosis not present

## 2019-01-26 DIAGNOSIS — I10 Essential (primary) hypertension: Secondary | ICD-10-CM

## 2019-01-26 DIAGNOSIS — K219 Gastro-esophageal reflux disease without esophagitis: Secondary | ICD-10-CM

## 2019-01-26 DIAGNOSIS — E559 Vitamin D deficiency, unspecified: Secondary | ICD-10-CM | POA: Diagnosis not present

## 2019-01-26 DIAGNOSIS — R7309 Other abnormal glucose: Secondary | ICD-10-CM | POA: Diagnosis not present

## 2019-01-26 DIAGNOSIS — E039 Hypothyroidism, unspecified: Secondary | ICD-10-CM | POA: Diagnosis not present

## 2019-01-26 MED ORDER — LOSARTAN POTASSIUM 50 MG PO TABS: 50.0000 mg | ORAL_TABLET | Freq: Every day | ORAL | 1 refills | Status: DC

## 2019-01-26 MED ORDER — OMEPRAZOLE 20 MG PO CPDR: 20.0000 mg | DELAYED_RELEASE_CAPSULE | Freq: Every day | ORAL | 4 refills | Status: DC

## 2019-01-26 MED ORDER — HYDROCHLOROTHIAZIDE 12.5 MG PO TABS: 12.5000 mg | ORAL_TABLET | Freq: Every day | ORAL | 1 refills | Status: DC

## 2019-01-26 NOTE — Patient Instructions (Addendum)
Margaret Townsend,   Thank you for coming in to clinic today.  1. START melatonin 3 mg (approx) at about 8:30-9 pm.  If tolerating without daytime sleepiness, may increase to max 10 mg at bedtime.  - Take for at least 14 days before considering stopping.  2. If not improving on melatonin. We can try Trazodone by prescription for sleep between next visit.  3. Continue all other medications without change.  4. You will be due for FASTING BLOOD WORK.  This means you should eat no food or drink after midnight.  Drink only water or coffee without cream/sugar on the morning of your lab visit. - Please go ahead and schedule a "Lab Only" visit in the morning at the clinic for lab draw in the next 7 days. - Your results will be available about 2-3 days after blood draw.  If you have set up a MyChart account, you can can log in to MyChart online to view your results and a brief explanation. Also, we can discuss your results together at your next office visit if you would like.  Please schedule a follow-up appointment with [name], AGNP. Return in about 3 months (around 04/26/2019).  If you have any other questions or concerns, please feel free to call the clinic or send a message through [place]. You may also schedule an earlier appointment if necessary.  You will receive a survey after today's visit either digitally by e-mail or paper by [employer]. Your experiences and feedback matter to [place].  Please respond so we know how we are doing as we provide care for you.  [name], DNP, AGNP-BC Adult Gerontology Nurse Practitioner [place]

## 2019-01-26 NOTE — Progress Notes (Signed)
Subjective:    Patient ID: Margaret Townsend, female    DOB: [birth year], 73 y.o.   MRN: [id number]  Margaret Townsend is a 73 y.o. female presenting on 01/26/2019 for Dizziness (fatigue, pt been taking the B12 with no improvement)   HPI Dizziness Lightheadedness - has previously had neg cardiac workup - Remains connected with endocrinology for TSH management with low T3, normal Free T4 - Patient saw neurology for neck pain in 2017 after a fall, but none now.  Had vestibular therapy after her fall and did not have significant change.  Patient continues having persistent dizziness without any worsening since that fall. - Meclizine does not help significantly due to patient going to bed/sleep after taking.  Insomnia - Patient has had difficulty with getting good sleep over the last 2 years. - Is taking Tylenol PM Takes 1/2 tablet for sleep.  When taking, sleeps only 3-4 hours.  Patient may occasionally have a snack and return to sleep. -   If not taking does not sleep "well at all." - Goes to bed "early" and sometimes lays down for 2 hours before going to sleep.  Then sleeps 2-3 hours, uses the bathroom, sleeps 1-2 hours, snacks, sleeps - Takes Tylenol PM and has trouble falling asleep, but may sleep from 11-11:30 to 4-4:30 - Does not feel any better with fatigue/energy levels the next day. - Feels jittery in early mornings if she has not had a snack.  Notes past "prediabetes."  GERD Reflux symptoms moderately controlled currently on PPI.   - Pt denies changes in vision, chest tightness/pressure, palpitations, shortness of breath, leg pain while walking, leg or arm weakness, and sudden loss of speech or loss of consciousness.   Hypertension - She is not checking BP at home or outside of clinic.    - Current medications: hctz 12.5 mg once daily, losartan 50 mg once daily, tolerating well without side effects - She is not currently symptomatic. - Pt denies headache, changes in vision, chest  tightness/pressure, palpitations, leg swelling, sudden loss of speech or loss of consciousness.   Social History   Tobacco Use  . Smoking status: Never Smoker  . Smokeless tobacco: Never Used  Substance Use Topics  . Alcohol use: Never    Frequency: Never  . Drug use: Never    Review of Systems Per HPI unless specifically indicated above     Objective:    BP 130/61 (BP Location: Left Arm, Patient Position: Sitting, Cuff Size: Normal)   Pulse 75   Temp 98.2 F (36.8 C) (Oral)   Ht 5\' 5"  (1.651 m)   Wt 171 lb 12.8 oz (77.9 kg)   BMI 28.59 kg/m   Wt Readings from Last 3 Encounters:  01/26/19 171 lb 12.8 oz (77.9 kg)  12/10/18 168 lb 9.6 oz (76.5 kg)  09/09/18 168 lb 12.8 oz (76.6 kg)    Physical Exam Vitals signs reviewed.  Constitutional:      General: She is not in acute distress.    Appearance: She is well-developed.  HENT:     Head: Normocephalic and atraumatic.  Cardiovascular:     Rate and Rhythm: Normal rate and regular rhythm.     Pulses:          Radial pulses are 2+ on the right side and 2+ on the left side.       Posterior tibial pulses are 1+ on the right side and 1+ on the left side.  Heart sounds: Normal heart sounds, S1 normal and S2 normal.  Pulmonary:     Effort: Pulmonary effort is normal. No respiratory distress.     Breath sounds: Normal breath sounds and air entry.  Abdominal:     General: Bowel sounds are normal. There is no distension.     Palpations: Abdomen is soft.     Tenderness: There is no abdominal tenderness.     Hernia: No hernia is present.  Musculoskeletal:     Right lower leg: No edema.     Left lower leg: No edema.  Skin:    General: Skin is warm and dry.     Capillary Refill: Capillary refill takes less than 2 seconds.  Neurological:     General: No focal deficit present.     Mental Status: She is alert and oriented to person, place, and time. Mental status is at baseline.     Cranial Nerves: No cranial nerve deficit.      Sensory: No sensory deficit.     Motor: No weakness.     Coordination: Coordination normal.     Gait: Gait normal.     Deep Tendon Reflexes: Reflexes are normal and symmetric. Reflexes normal.  Psychiatric:        Attention and Perception: Attention normal.        Mood and Affect: Mood and affect normal.        Behavior: Behavior normal. Behavior is cooperative.        Thought Content: Thought content normal.        Judgment: Judgment normal.    Results for orders placed or performed in visit on 01/26/19  VITAMIN D 25 Hydroxy (Vit-D Deficiency, Fractures)  Result Value Ref Range   Vit D, 25-Hydroxy 31 30 - 100 ng/mL  Lipid panel  Result Value Ref Range   Cholesterol 238 (H) <200 mg/dL   HDL 40 (L) > OR = 50 mg/dL   Triglycerides 208 (H) <150 mg/dL   LDL Cholesterol (Calc) 161 (H) mg/dL (calc)   Total CHOL/HDL Ratio 6.0 (H) <5.0 (calc)   Non-HDL Cholesterol (Calc) 198 (H) <130 mg/dL (calc)  COMPLETE METABOLIC PANEL WITH GFR  Result Value Ref Range   Glucose, Bld 99 65 - 99 mg/dL   BUN 15 7 - 25 mg/dL   Creat 0.85 0.60 - 0.93 mg/dL   GFR, Est Non African American 69 > OR = 60 mL/min/1.61m2   GFR, Est African American 80 > OR = 60 mL/min/1.20m2   BUN/Creatinine Ratio NOT APPLICABLE 6 - 22 (calc)   Sodium 142 135 - 146 mmol/L   Potassium 4.1 3.5 - 5.3 mmol/L   Chloride 105 98 - 110 mmol/L   CO2 28 20 - 32 mmol/L   Calcium 9.2 8.6 - 10.4 mg/dL   Total Protein 6.2 6.1 - 8.1 g/dL   Albumin 3.9 3.6 - 5.1 g/dL   Globulin 2.3 1.9 - 3.7 g/dL (calc)   AG Ratio 1.7 1.0 - 2.5 (calc)   Total Bilirubin 0.5 0.2 - 1.2 mg/dL   Alkaline phosphatase (APISO) 73 37 - 153 U/L   AST 21 10 - 35 U/L   ALT 21 6 - 29 U/L  CBC with Differential/Platelet  Result Value Ref Range   WBC 6.7 3.8 - 10.8 Thousand/uL   RBC 4.43 3.80 - 5.10 Million/uL   Hemoglobin 13.5 11.7 - 15.5 g/dL   HCT 39.8 35.0 - 45.0 %   MCV 89.8 80.0 - 100.0 fL   MCH  30.5 27.0 - 33.0 pg   MCHC 33.9 32.0 - 36.0 g/dL    RDW 13.0 11.0 - 15.0 %   Platelets 235 140 - 400 Thousand/uL   MPV 11.2 7.5 - 12.5 fL   Neutro Abs 3,444 1,500 - 7,800 cells/uL   Lymphs Abs 2,419 850 - 3,900 cells/uL   Absolute Monocytes 509 200 - 950 cells/uL   Eosinophils Absolute 281 15 - 500 cells/uL   Basophils Absolute 47 0 - 200 cells/uL   Neutrophils Relative % 51.4 %   Total Lymphocyte 36.1 %   Monocytes Relative 7.6 %   Eosinophils Relative 4.2 %   Basophils Relative 0.7 %  Hemoglobin A1c  Result Value Ref Range   Hgb A1c MFr Bld 5.8 (H) <5.7 % of total Hgb   Mean Plasma Glucose 120 (calc)   eAG (mmol/L) 6.6 (calc)      Assessment & Plan:   Problem List Items Addressed This Visit      Cardiovascular and Mediastinum   Essential (primary) hypertension - Primary Controlled hypertension.  BP goal < 130/80.  Pt is maintaining lifestyle modifications.  Taking medications tolerating well without side effects. No currently identified complications.  Plan: 1. Continue taking medications without changes today 2. Obtain labs today  3. Encouraged heart healthy diet and increasing exercise to 30 minutes most days of the week. 4. Check BP 1-2 x per week at home, keep log, and bring to clinic at next appointment. 5. Follow up 3 months.     Relevant Medications   losartan (COZAAR) 50 MG tablet   hydrochlorothiazide (HYDRODIURIL) 12.5 MG tablet   Other Relevant Orders   COMPLETE METABOLIC PANEL WITH GFR (Completed)     Digestive   Gastro-esophageal reflux disease without esophagitis Currently well controlled on omeprazole 20mg  once daily.  Plan: 1. Continue omeprazole 20mg  once daily. Side effects discussed. Pt wants to continue med. 2. Avoid diet triggers. Reviewed need to seek care if globus sensation, difficulty swallowing, s/sx of GI bleed. 3. Follow up as needed and in 3-6 months.   Relevant Medications   omeprazole (PRILOSEC) 20 MG capsule   Other Relevant Orders   CBC with Differential/Platelet (Completed)      Endocrine   Adult hypothyroidism Continue follow-up with endocrinology for hypothyroidism.     Other   HLD (hyperlipidemia) Uncontrolled hyperlipidemia.  Currently not taking statin medication.  NO recent eval.  Labs today to assess status and lifestyle control.  No personal history of prior ASCVD events.  FOLLOW-UP after labs for possible medications   Relevant Medications   losartan (COZAAR) 50 MG tablet   hydrochlorothiazide (HYDRODIURIL) 12.5 MG tablet   Other Relevant Orders   Lipid panel (Completed)   Insomnia Patient with insomnia, possible cause of fatigue, mild lightheadedness.   - Encouraged good sleep hygiene - START melatonin 3 mg once daily for 14 days. - May try trazodone in future if needed before next visit in 3 months.  Avitaminosis D Check vitamin D for follow-up for fatigue, dizziness.  Patient with history of low vit D.   Relevant Orders   VITAMIN D 25 Hydroxy (Vit-D Deficiency, Fractures) (Completed)      Meds ordered this encounter  Medications  . omeprazole (PRILOSEC) 20 MG capsule    Sig: Take 1 capsule (20 mg total) by mouth daily.    Dispense:  90 capsule    Refill:  4  . losartan (COZAAR) 50 MG tablet    Sig: Take 1 tablet (50 mg  total) by mouth daily.    Dispense:  90 tablet    Refill:  1    Order Specific Question:   Supervising Provider    Answer:   Olin Hauser [[id number]]  . hydrochlorothiazide (HYDRODIURIL) 12.5 MG tablet    Sig: Take 1 tablet (12.5 mg total) by mouth daily.    Dispense:  90 tablet    Refill:  1    Order Specific Question:   Supervising Provider    Answer:   Olin Hauser [[id number]]    Follow up plan: Return in about 3 months (around 04/26/2019).  [name], DNP, AGPCNP-BC Adult Gerontology Primary Care Nurse Practitioner [facility] 01/26/2019, 11:22 AM

## 2019-01-27 ENCOUNTER — Other Ambulatory Visit: Payer: Medicare HMO

## 2019-01-27 ENCOUNTER — Other Ambulatory Visit: Payer: Self-pay | Admitting: Nurse Practitioner

## 2019-01-27 DIAGNOSIS — I10 Essential (primary) hypertension: Secondary | ICD-10-CM | POA: Diagnosis not present

## 2019-01-27 DIAGNOSIS — R7309 Other abnormal glucose: Secondary | ICD-10-CM

## 2019-01-27 DIAGNOSIS — R799 Abnormal finding of blood chemistry, unspecified: Secondary | ICD-10-CM | POA: Diagnosis not present

## 2019-01-27 DIAGNOSIS — E559 Vitamin D deficiency, unspecified: Secondary | ICD-10-CM | POA: Diagnosis not present

## 2019-01-27 DIAGNOSIS — E782 Mixed hyperlipidemia: Secondary | ICD-10-CM | POA: Diagnosis not present

## 2019-01-27 DIAGNOSIS — K219 Gastro-esophageal reflux disease without esophagitis: Secondary | ICD-10-CM | POA: Diagnosis not present

## 2019-01-28 LAB — COMPLETE METABOLIC PANEL WITH GFR
AG Ratio: 1.7 (calc) (ref 1.0–2.5)
ALT: 21 U/L (ref 6–29)
AST: 21 U/L (ref 10–35)
Albumin: 3.9 g/dL (ref 3.6–5.1)
Alkaline phosphatase (APISO): 73 U/L (ref 37–153)
BUN: 15 mg/dL (ref 7–25)
CO2: 28 mmol/L (ref 20–32)
Calcium: 9.2 mg/dL (ref 8.6–10.4)
Chloride: 105 mmol/L (ref 98–110)
Creat: 0.85 mg/dL (ref 0.60–0.93)
GFR, Est African American: 80 mL/min/{1.73_m2} (ref >=60)
GFR, Est Non African American: 69 mL/min/{1.73_m2} (ref >=60)
Globulin: 2.3 g/dL (calc) (ref 1.9–3.7)
Glucose, Bld: 99 mg/dL (ref 65–99)
Potassium: 4.1 mmol/L (ref 3.5–5.3)
Sodium: 142 mmol/L (ref 135–146)
Total Bilirubin: 0.5 mg/dL (ref 0.2–1.2)
Total Protein: 6.2 g/dL (ref 6.1–8.1)

## 2019-01-28 LAB — CBC WITH DIFFERENTIAL/PLATELET
Absolute Monocytes: 509 cells/uL (ref 200–950)
Basophils Absolute: 47 cells/uL (ref 0–200)
Basophils Relative: 0.7 %
Eosinophils Absolute: 281 cells/uL (ref 15–500)
Eosinophils Relative: 4.2 %
HCT: 39.8 % (ref 35.0–45.0)
Hemoglobin: 13.5 g/dL (ref 11.7–15.5)
Lymphs Abs: 2419 cells/uL (ref 850–3900)
MCH: 30.5 pg (ref 27.0–33.0)
MCHC: 33.9 g/dL (ref 32.0–36.0)
MCV: 89.8 fL (ref 80.0–100.0)
MPV: 11.2 fL (ref 7.5–12.5)
Monocytes Relative: 7.6 %
Neutro Abs: 3444 cells/uL (ref 1500–7800)
Neutrophils Relative %: 51.4 %
Platelets: 235 10*3/uL (ref 140–400)
RBC: 4.43 10*6/uL (ref 3.80–5.10)
RDW: 13 % (ref 11.0–15.0)
Total Lymphocyte: 36.1 %
WBC: 6.7 10*3/uL (ref 3.8–10.8)

## 2019-01-28 LAB — HEMOGLOBIN A1C
Hgb A1c MFr Bld: 5.8 % of total Hgb — ABNORMAL HIGH (ref <5.7)
Mean Plasma Glucose: 120 (calc)
eAG (mmol/L): 6.6 (calc)

## 2019-01-28 LAB — LIPID PANEL
Cholesterol: 238 mg/dL — ABNORMAL HIGH (ref <200)
HDL: 40 mg/dL — ABNORMAL LOW (ref >=50)
LDL Cholesterol (Calc): 161 mg/dL (calc) — ABNORMAL HIGH
Non-HDL Cholesterol (Calc): 198 mg/dL (calc) — ABNORMAL HIGH (ref <130)
Total CHOL/HDL Ratio: 6 (calc) — ABNORMAL HIGH (ref <5.0)
Triglycerides: 208 mg/dL — ABNORMAL HIGH (ref <150)

## 2019-01-28 LAB — VITAMIN D 25 HYDROXY (VIT D DEFICIENCY, FRACTURES): Vit D, 25-Hydroxy: 31 ng/mL (ref 30–100)

## 2019-01-29 ENCOUNTER — Encounter: Payer: Self-pay | Admitting: Nurse Practitioner

## 2019-02-01 MED ORDER — ROSUVASTATIN CALCIUM 5 MG PO TABS: 5.0000 mg | ORAL_TABLET | Freq: Every day | ORAL | 3 refills | Status: AC

## 2019-02-01 NOTE — Addendum Note (Signed)
Addended by: [name] R on: 02/01/2019 01:07 PM   Modules accepted: Orders

## 2019-02-03 DIAGNOSIS — E538 Deficiency of other specified B group vitamins: Secondary | ICD-10-CM | POA: Diagnosis not present

## 2019-02-03 DIAGNOSIS — E039 Hypothyroidism, unspecified: Secondary | ICD-10-CM | POA: Diagnosis not present

## 2019-02-03 DIAGNOSIS — R5383 Other fatigue: Secondary | ICD-10-CM | POA: Diagnosis not present

## 2019-02-05 ENCOUNTER — Telehealth: Payer: Self-pay

## 2019-02-05 NOTE — Telephone Encounter (Signed)
Statins are not listed as allergies with patient.  We can continue off if needed.  Also would discuss with patient alternative dosing with 5 mg 1 or 2 days per week.

## 2019-02-05 NOTE — Telephone Encounter (Signed)
The pt called with concern because she received Crestor in the mail and she is not currently on statin medication. She states the last time she took a statin medication her leg swelled and she had muscle pain. I reviewed the patient labs results with her and she decided to keep the Crestor, but states she will start it once her Thyroid levels are under control.

## 2019-02-10 DIAGNOSIS — E039 Hypothyroidism, unspecified: Secondary | ICD-10-CM | POA: Diagnosis not present

## 2019-03-24 DIAGNOSIS — E039 Hypothyroidism, unspecified: Secondary | ICD-10-CM | POA: Diagnosis not present

## 2019-04-29 ENCOUNTER — Ambulatory Visit: Payer: Self-pay | Admitting: Nurse Practitioner

## 2019-05-05 ENCOUNTER — Other Ambulatory Visit: Payer: Self-pay

## 2019-05-05 ENCOUNTER — Ambulatory Visit (INDEPENDENT_AMBULATORY_CARE_PROVIDER_SITE_OTHER): Payer: Medicare HMO | Admitting: Family Medicine

## 2019-05-05 ENCOUNTER — Encounter: Payer: Self-pay | Admitting: Family Medicine

## 2019-05-05 VITALS — BP 141/72 | HR 78

## 2019-05-05 DIAGNOSIS — F5101 Primary insomnia: Secondary | ICD-10-CM | POA: Diagnosis not present

## 2019-05-05 DIAGNOSIS — E039 Hypothyroidism, unspecified: Secondary | ICD-10-CM | POA: Diagnosis not present

## 2019-05-05 DIAGNOSIS — I10 Essential (primary) hypertension: Secondary | ICD-10-CM | POA: Diagnosis not present

## 2019-05-05 NOTE — Patient Instructions (Addendum)
Keep watch on BP. I am okay with current reading 140s if you feel fine, it may improve, it may be due to thyroid, follow with thyroid doctors for now.  It is okay to take half pill tylenol pm, can take whole if needed. Be careful with side effects, if confusion and dizzy and other concerns may have to stop, then we can consider a rx medication   Please schedule a Follow-up Appointment to: Return in about 3 months (around 08/05/2019) for HTN, thyroid, insomnia w/ PCP.  If you have any other questions or concerns, please feel free to call the office or send a message through [place]. You may also schedule an earlier appointment if necessary.  Additionally, you may be receiving a survey about your experience at our office within a few days to 1 week by e-mail or mail. We value your feedback.  Nobie Putnam, DO [place]

## 2019-05-05 NOTE — Progress Notes (Signed)
Virtual Visit via Telephone The purpose of this virtual visit is to provide medical care while limiting exposure to the novel coronavirus (COVID19) for both patient and office staff.  Consent was obtained for phone visit:  Yes.   Answered questions that patient had about telehealth interaction:  Yes.   I discussed the limitations, risks, security and privacy concerns of performing an evaluation and management service by telephone. I also discussed with the patient that there may be a patient responsible charge related to this service. The patient expressed understanding and agreed to proceed.  Patient Location: Home Provider Location: [facility] (Office)  PCP is [name], AGPCNP-BC - I am currently covering during her maternity leave.   ---------------------------------------------------------------------- Chief Complaint  Patient presents with  . Hypertension    elevated x 1 week 141-145/ 72-76    S: Reviewed CMA documentation. I have called patient and gathered additional HPI as follows:  CHRONIC HTN: Reports previous visit 01/2019, averaging home BP readings SBP 120 usually,  Current Meds - HCTZ 12.5mg  daily, Losartan 50mg  daily   Reports good compliance, took meds today. Tolerating well, w/o complaints. Denies CP, dyspnea, HA, edema, dizziness / lightheadedness  Hypothyroid Previous reading low TSH in 09/2018. On Levothyroxine 132mcg daily.  Insomnia Last visit with PCP, she was taking Tylenol PM nightly half pill with good results, and then she was advised to try melatonin, but had side effect and some bad dreams did not sleep better.   Denies any high risk travel to areas of current concern for COVID19. Denies any known or suspected exposure to person with or possibly with COVID19.  Denies any fevers, chills, sweats, body ache, cough, shortness of breath, sinus pain or pressure, headache, abdominal pain, diarrhea  Past Medical History:  Diagnosis  Date  . Breast cancer ([place])   . Cancer ([facility])    breast  . GERD (gastroesophageal reflux disease)   . Headache   . Hypertension   . Hypothyroidism   . Thyroid disease    Social History   Tobacco Use  . Smoking status: Never Smoker  . Smokeless tobacco: Never Used  Substance Use Topics  . Alcohol use: Never    Frequency: Never  . Drug use: Never    Current Outpatient Medications:  .  Ascorbic Acid (VITAMIN C) 1000 MG tablet, Take by mouth., Disp: , Rfl:  .  Cholecalciferol (VITAMIN D3) 1000 units CAPS, Take by mouth., Disp: , Rfl:  .  diphenhydramine-acetaminophen (TYLENOL PM) 25-500 MG TABS tablet, Take 1 tablet by mouth at bedtime as needed., Disp: , Rfl:  .  Green Tea Oil Fragrance OIL, by Does not apply route., Disp: , Rfl:  .  hydrochlorothiazide (HYDRODIURIL) 12.5 MG tablet, Take 1 tablet (12.5 mg total) by mouth daily., Disp: 90 tablet, Rfl: 1 .  letrozole (FEMARA) 2.5 MG tablet, Take 2.5 mg by mouth daily before breakfast. , Disp: , Rfl:  .  levothyroxine (SYNTHROID) 112 MCG tablet, Take 1 tablet (112 mcg total) by mouth daily before breakfast., Disp: 30 tablet, Rfl: 5 .  losartan (COZAAR) 50 MG tablet, Take 1 tablet (50 mg total) by mouth daily., Disp: 90 tablet, Rfl: 1 .  meclizine (ANTIVERT) 12.5 MG tablet, Take 1 tablet (12.5 mg total) by mouth 3 (three) times daily as needed for dizziness., Disp: 30 tablet, Rfl: 0 .  omeprazole (PRILOSEC) 20 MG capsule, Take 1 capsule (20 mg total) by mouth daily., Disp: 90 capsule, Rfl: 4 .  rosuvastatin (CRESTOR) 5  MG tablet, Take 1 tablet (5 mg total) by mouth daily. (Patient taking differently: Take 5 mg by mouth every other day. ), Disp: 90 tablet, Rfl: 3 .  vitamin B-12 (CYANOCOBALAMIN) 1000 MCG tablet, Take 1,000 mcg by mouth daily., Disp: , Rfl:  .  vitamin E 400 UNIT capsule, Take by mouth., Disp: , Rfl:  .  Multiple Vitamin (MULTI-VITAMINS) TABS, Take by mouth., Disp: , Rfl:   Depression screen [facility] 2/9 09/09/2018  Decreased  Interest 3  Down, Depressed, Hopeless 0  PHQ - 2 Score 3  Altered sleeping 2  Tired, decreased energy 3  Change in appetite 0  Feeling bad or failure about yourself  0  Trouble concentrating 0  Moving slowly or fidgety/restless 0  Suicidal thoughts 0  PHQ-9 Score 8  Difficult doing work/chores Not difficult at all    No flowsheet data found.  -------------------------------------------------------------------------- O: No physical exam performed due to remote telephone encounter.  Lab results reviewed.  No results found for this or any previous visit (from the past 2160 hour(s)).  -------------------------------------------------------------------------- A&P:  Problem List Items Addressed This Visit    Adult hypothyroidism Previous low TSH Due for future TSH lab, did not come to office for blood at this time due to coronavirus pandemic Continue current dose levothyroxine 13mcg daily    Essential (primary) hypertension - Primary Reportedly controlled HTN on current regimen No changes to meds today     Other Visit Diagnoses    Primary insomnia      Persistent insomnia, mostly sleep onset Improved on tylenol PM Failed melatonin, some intolerance May take Tylenol PM - caution side effects anticholinergic, she is not having side effect now, if develop issues with tylenol or ineffective, then advise may return to consider rx such as trazodone as previously recomended      No orders of the defined types were placed in this encounter.   Follow-up: - Return in 3 months for HTN, thyroid, insomnia w/ PCP  Patient verbalizes understanding with the above medical recommendations including the limitation of remote medical advice.  Specific follow-up and call-back criteria were given for patient to follow-up or seek medical care more urgently if needed.   - Time spent in direct consultation with patient on phone: 15 minutes   Nobie Putnam, [facility] 05/05/2019, 10:58 AM

## 2019-05-13 DIAGNOSIS — E039 Hypothyroidism, unspecified: Secondary | ICD-10-CM | POA: Diagnosis not present

## 2019-05-20 DIAGNOSIS — R252 Cramp and spasm: Secondary | ICD-10-CM | POA: Diagnosis not present

## 2019-05-20 DIAGNOSIS — E559 Vitamin D deficiency, unspecified: Secondary | ICD-10-CM | POA: Diagnosis not present

## 2019-05-20 DIAGNOSIS — E538 Deficiency of other specified B group vitamins: Secondary | ICD-10-CM | POA: Diagnosis not present

## 2019-05-20 DIAGNOSIS — E039 Hypothyroidism, unspecified: Secondary | ICD-10-CM | POA: Diagnosis not present

## 2019-07-05 DIAGNOSIS — C50811 Malignant neoplasm of overlapping sites of right female breast: Secondary | ICD-10-CM | POA: Diagnosis not present

## 2019-07-05 DIAGNOSIS — Z170 Estrogen receptor positive status [ER+]: Secondary | ICD-10-CM | POA: Diagnosis not present

## 2019-07-05 DIAGNOSIS — Z780 Asymptomatic menopausal state: Secondary | ICD-10-CM | POA: Diagnosis not present

## 2019-07-26 ENCOUNTER — Telehealth: Payer: Self-pay | Admitting: Nurse Practitioner

## 2019-07-26 NOTE — Chronic Care Management (AMB) (Signed)
Chronic Care Management   Note  07/26/2019 Name: Margaret Townsend MRN: [id number] DOB: 1946-10-11  [name] is a 73 y.o. year old female who is a primary care patient of Mikey College, NP. I reached out to Netta Corrigan by phone today in response to a referral sent by [name] [employer]'s health plan.    [name] was given information about Chronic Care Management services today including:  1. CCM service includes personalized support from designated clinical staff supervised by her physician, including individualized plan of care and coordination with other care providers 2. 24/7 contact phone numbers for assistance for urgent and routine care needs. 3. Service will only be billed when office clinical staff spend 20 minutes or more in a month to coordinate care. 4. Only one practitioner may furnish and bill the service in a calendar month. 5. The patient may stop CCM services at any time (effective at the end of the month) by phone call to the office staff. 6. The patient will be responsible for cost sharing (co-pay) of up to 20% of the service fee (after annual deductible is met).  Patient agreed to services and verbal consent obtained.   Follow up plan: Telephone appointment with CCM team member scheduled for: 09/13/2019  [name]  [email or web address]   ??[phone]

## 2019-07-26 NOTE — Chronic Care Management (AMB) (Signed)
°  Chronic Care Management   Outreach Note  07/26/2019 Name: Margaret Townsend MRN: [id number] DOB: [birth year]  Referred by: Mikey College, NP Reason for referral : Chronic Care Management (Initial CCM outreach was unsuccessful.)   An unsuccessful telephone outreach was attempted today. The patient was referred to the case management team by for assistance with chronic care management and care coordination.   Follow Up Plan: A HIPPA compliant phone message was left for the patient providing contact information and requesting a return call.  The care management team will reach out to the patient again over the next 7 days.  If patient returns call to provider office, please advise to call [place] at [place]  [email or web address]   ??[phone]

## 2019-07-29 ENCOUNTER — Encounter: Payer: Self-pay | Admitting: Nurse Practitioner

## 2019-07-29 ENCOUNTER — Ambulatory Visit
Admission: RE | Admit: 2019-07-29 | Discharge: 2019-07-29 | Disposition: A | Discharge status: Home / Self Care | Payer: Medicare HMO | Attending: Nurse Practitioner | Admitting: Nurse Practitioner

## 2019-07-29 ENCOUNTER — Ambulatory Visit
Admission: RE | Admit: 2019-07-29 | Discharge: 2019-07-29 | Disposition: A | Discharge status: Home / Self Care | Payer: Medicare HMO | Source: Ambulatory Visit | Attending: Nurse Practitioner | Admitting: Nurse Practitioner

## 2019-07-29 ENCOUNTER — Other Ambulatory Visit: Payer: Self-pay

## 2019-07-29 ENCOUNTER — Ambulatory Visit (INDEPENDENT_AMBULATORY_CARE_PROVIDER_SITE_OTHER): Payer: Medicare HMO | Admitting: Nurse Practitioner

## 2019-07-29 VITALS — BP 128/68 | HR 89 | Ht 65.0 in | Wt 171.4 lb

## 2019-07-29 DIAGNOSIS — E782 Mixed hyperlipidemia: Secondary | ICD-10-CM

## 2019-07-29 DIAGNOSIS — E538 Deficiency of other specified B group vitamins: Secondary | ICD-10-CM | POA: Diagnosis not present

## 2019-07-29 DIAGNOSIS — G8929 Other chronic pain: Secondary | ICD-10-CM

## 2019-07-29 DIAGNOSIS — G629 Polyneuropathy, unspecified: Secondary | ICD-10-CM | POA: Diagnosis not present

## 2019-07-29 DIAGNOSIS — K219 Gastro-esophageal reflux disease without esophagitis: Secondary | ICD-10-CM | POA: Diagnosis not present

## 2019-07-29 DIAGNOSIS — M25562 Pain in left knee: Secondary | ICD-10-CM | POA: Diagnosis not present

## 2019-07-29 DIAGNOSIS — I10 Essential (primary) hypertension: Secondary | ICD-10-CM | POA: Diagnosis not present

## 2019-07-29 DIAGNOSIS — M25462 Effusion, left knee: Secondary | ICD-10-CM | POA: Diagnosis not present

## 2019-07-29 MED ORDER — LOSARTAN POTASSIUM-HCTZ 50-12.5 MG PO TABS: 1.0000 | ORAL_TABLET | Freq: Every day | ORAL | 3 refills | Status: AC

## 2019-07-29 MED ORDER — FAMOTIDINE 20 MG PO TABS: 20.0000 mg | ORAL_TABLET | Freq: Two times a day (BID) | ORAL | 1 refills | Status: AC | PRN

## 2019-07-29 NOTE — Progress Notes (Signed)
Subjective:    Patient ID: Margaret Townsend, female    DOB: [birth year], 73 y.o.   MRN: [id number]  Margaret Townsend is a 73 y.o. female presenting on 07/29/2019 for Hypertension and Insomnia (constant chronic left knee pain. The pain worsen at bedtime and interrupt sleeping.)  HPI Hypertension - She is checking BP at home or outside of clinic.  Readings 125/70-131/70 - Current medications: losartan 50 mg, HCTZ 12.5 mg once daily, tolerating well without side effects - She is not currently symptomatic. - Pt denies headache, lightheadedness, dizziness, changes in vision, chest tightness/pressure, palpitations, leg swelling, sudden loss of speech or loss of consciousness. - Her diet is moderate in salt, moderate in fat, and moderate in carbohydrates.   LEFT Knee pain Bilateral Foot pain/tingling/numbness Has had LEFT knee surgery 1977 and removed cartilage.  Eventually would need knee replacement.  Now giving her "right much prblem."  When she sits a while or lays down her left foot goes to sleep.  Oncologist asked to check for arthritis vs letrozole complication.   - Bottom of feet always feel like they are asleep.  Feels like walking on rocks barefoot, so she almost always wears shoes.  GERD Patient takes omeprazole 4/7 nights per week.  Good control with intermittent dosing.   - She reports no n/v, coffee ground emesis, dark/black/tarry stool, BRBPR, or other GI bleeding.   Social History   Tobacco Use  . Smoking status: Never Smoker  . Smokeless tobacco: Never Used  Substance Use Topics  . Alcohol use: Never    Frequency: Never  . Drug use: Never    Review of Systems Per HPI unless specifically indicated above     Objective:    BP 128/68 (BP Location: Left Arm, Patient Position: Sitting, Cuff Size: Normal)   Pulse 89   Ht 5\' 5"  (1.651 m)   Wt 171 lb 6.4 oz (77.7 kg)   BMI 28.52 kg/m   Wt Readings from Last 3 Encounters:  07/29/19 171 lb 6.4 oz (77.7 kg)  01/26/19 171 lb  12.8 oz (77.9 kg)  12/10/18 168 lb 9.6 oz (76.5 kg)    Physical Exam Vitals signs reviewed.  Constitutional:      General: She is awake. She is not in acute distress.    Appearance: She is well-developed.  HENT:     Head: Normocephalic and atraumatic.  Neck:     Musculoskeletal: Normal range of motion and neck supple.     Vascular: No carotid bruit.  Cardiovascular:     Rate and Rhythm: Normal rate and regular rhythm.     Pulses:          Radial pulses are 2+ on the right side and 2+ on the left side.       Posterior tibial pulses are 1+ on the right side and 1+ on the left side.     Heart sounds: Normal heart sounds, S1 normal and S2 normal.  Pulmonary:     Effort: Pulmonary effort is normal. No respiratory distress.     Breath sounds: Normal breath sounds and air entry.  Abdominal:     General: Bowel sounds are normal. There is no distension.     Palpations: Abdomen is soft.     Tenderness: There is no abdominal tenderness.     Hernia: No hernia is present.  Musculoskeletal:     Comments: Bilateral Knees Inspection: LEFT knee lateral effusion.  No effusion on RIGHT.  No ecchymosis. Palpation: Moderate  TTP Left knee only medial/lateral joint line. Crepitus ROM: Full active ROM bilaterally Special Testing: Lachman / Valgus/Varus tests negative with intact ligaments (ACL, MCL, LCL). McMurray positive with meniscus symptoms. Strength: 5/5 intact knee flex/ext, ankle dorsi/plantarflex Neurovascular: see neuro exam   Skin:    General: Skin is warm and dry.     Capillary Refill: Capillary refill takes less than 2 seconds.  Neurological:     Mental Status: She is alert and oriented to person, place, and time.     Cranial Nerves: No cranial nerve deficit.     Sensory: Sensory deficit present.     Motor: No weakness.     Comments: Foot exam exhibits decreased monofilament sensation at [facility] toes, third toes bilaterally Good sensation to light touch, good cap refill. - Patient  has decreased monofilament sensation to level of 3 inches above medial malleolus of LEFT leg  Psychiatric:        Attention and Perception: Attention normal.        Mood and Affect: Mood and affect normal.        Behavior: Behavior normal. Behavior is cooperative.        Thought Content: Thought content normal.        Judgment: Judgment normal.     Results for orders placed or performed in visit on 07/29/19  B12  Result Value Ref Range   Vitamin B-12 365 200 - 1,100 pg/mL  Lipid panel  Result Value Ref Range   Cholesterol 218 (H) <200 mg/dL   HDL 45 (L) > OR = 50 mg/dL   Triglycerides 156 (H) <150 mg/dL   LDL Cholesterol (Calc) 144 (H) mg/dL (calc)   Total CHOL/HDL Ratio 4.8 <5.0 (calc)   Non-HDL Cholesterol (Calc) 173 (H) <130 mg/dL (calc)  COMPLETE METABOLIC PANEL WITH GFR  Result Value Ref Range   Glucose, Bld 110 (H) 65 - 99 mg/dL   BUN 14 7 - 25 mg/dL   Creat 0.82 0.60 - 0.93 mg/dL   GFR, Est Non African American 72 > OR = 60 mL/min/1.62m2   GFR, Est African American 83 > OR = 60 mL/min/1.66m2   BUN/Creatinine Ratio NOT APPLICABLE 6 - 22 (calc)   Sodium 140 135 - 146 mmol/L   Potassium 4.0 3.5 - 5.3 mmol/L   Chloride 104 98 - 110 mmol/L   CO2 28 20 - 32 mmol/L   Calcium 9.3 8.6 - 10.4 mg/dL   Total Protein 6.2 6.1 - 8.1 g/dL   Albumin 4.0 3.6 - 5.1 g/dL   Globulin 2.2 1.9 - 3.7 g/dL (calc)   AG Ratio 1.8 1.0 - 2.5 (calc)   Total Bilirubin 0.5 0.2 - 1.2 mg/dL   Alkaline phosphatase (APISO) 81 37 - 153 U/L   AST 19 10 - 35 U/L   ALT 20 6 - 29 U/L      Assessment & Plan:   Problem List Items Addressed This Visit      Cardiovascular and Mediastinum   Essential (primary) hypertension - Primary Controlled hypertension.  BP goal < 130/80.  Pt is maintaining prior lifestyle modifications.  Taking medications tolerating well without side effects. No current complications.  Plan: 1. Continue taking medications without changes.  Try to return to combination pill for  patient preference. 2. Obtain labs  3. Encouraged heart healthy diet and increasing exercise to 30 minutes most days of the week. 4. Check BP 1-2 x per week at home, keep log, and bring to clinic at next appointment.  5. Follow up 6 months.     Relevant Medications   losartan-hydrochlorothiazide (HYZAAR) 50-12.5 MG tablet   Other Relevant Orders   COMPLETE METABOLIC PANEL WITH GFR (Completed)     Digestive   Gastro-esophageal reflux disease without esophagitis Stable without bleeding symptoms or breakthrough heartburn on intermittent PPI dosing.    Plan: 1. STOP omeprazole (can return if needed) 2. START famotidine 20 mg bid prn heartburn 3. Continue to avoid trigger foods/late meals 4. Follow-up 6 months and sooner prn to restart omeprazole if famotidine not effective.   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  Combination of factors worsening pain on left do include arthritis/joint deterioration, but due to bilateral nature of sensation loss, is likely letrozole or other systemic cause.  Plan: 1. Offered neurology referral - declined for now 2. Discuss this with oncologist ASAP to review drug causes 3. Follow-up here prn an in 3-6 months Could consider starting gabapentin - declined at this time.     Other   HLD (hyperlipidemia) Patient stable without ASCVD events intervally.  Continue statin therapy without changes.  Labs due today.  Follow-up 6 months.   Relevant Medications   losartan-hydrochlorothiazide (HYZAAR) 50-12.5 MG tablet   Other Relevant Orders   Lipid panel (Completed)   COMPLETE METABOLIC PANEL WITH GFR (Completed)   Chronic pain of left knee Left knee pain due to degenerative changes.  Recommend patient obtain Xray today (which confirms same).  Referral placed to orthopedic surgery.  Can consider steroid injections vs surgical options.  Follow-up here  prn.   Relevant Orders   DG Knee Complete 4 Views Left (Completed)    Other Visit Diagnoses    B12 deficiency     Patient with past B12 deficiency.  Possible cause of neuropathy/paresthesias.  Recheck levels and replete if needed.     Relevant Orders   B12 (Completed)      Meds ordered this encounter  Medications  . losartan-hydrochlorothiazide (HYZAAR) 50-12.5 MG tablet    Sig: Take 1 tablet by mouth daily.    Dispense:  90 tablet    Refill:  3    Order Specific Question:   Supervising Provider    Answer:   Olin Hauser [[id number]]  . famotidine (PEPCID) 20 MG tablet    Sig: Take 1 tablet (20 mg total) by mouth 2 (two) times daily as needed for heartburn or indigestion.    Dispense:  180 tablet    Refill:  1    Order Specific Question:   Supervising Provider    Answer:   Olin Hauser [[id number]]    Follow up plan: Return in about 6 months (around 01/29/2020) for hypertension, GERD, cholesterol.  [name], DNP, AGPCNP-BC Adult Gerontology Primary Care Nurse Practitioner [facility] 07/29/2019, 1:36 PM

## 2019-07-29 NOTE — Patient Instructions (Addendum)
Margaret Townsend,   Thank you for coming in to clinic today.  1. LEFT knee - START aleve 220 mg twice daily for 14 days. Then take as needed. - Continue use of ice/heat for swelling control - Xray at [place] center.  Order is in.  Walk-in service available. [address]   2. CHANGE heartburn medicine. - STOP Omepraozle - START famotidine 20 mg twice daily as needed.  If not helping, can always restart omeprazole - call clinic.  3. Contact your oncologist again.  You have both neuropathy and left knee effusion. Check to see if the neuropathy is from Letrozole  4. You will be due for [place].  This means you should eat no food or drink after midnight.  Drink only water or coffee without cream/sugar on the morning of your lab visit. - Please go ahead and schedule a "Lab Only" visit in the morning at the clinic for lab draw in the next 7 days. - Your results will be available about 2-3 days after blood draw.  If you have set up a MyChart account, you can can log in to MyChart online to view your results and a brief explanation. Also, we can discuss your results together at your next office visit if you would like.   Please schedule a follow-up appointment with [name], AGNP. Return in about 6 months (around 01/29/2020) for hypertension, GERD, cholesterol.  If you have any other questions or concerns, please feel free to call the clinic or send a message through [place]. You may also schedule an earlier appointment if necessary.  You will receive a survey after today's visit either digitally by e-mail or paper by [employer]. Your experiences and feedback matter to [place].  Please respond so we know how we are doing as we provide care for you.   [name], DNP, AGNP-BC Adult Gerontology Nurse Practitioner [place]

## 2019-07-30 DIAGNOSIS — E538 Deficiency of other specified B group vitamins: Secondary | ICD-10-CM | POA: Diagnosis not present

## 2019-07-30 DIAGNOSIS — E782 Mixed hyperlipidemia: Secondary | ICD-10-CM | POA: Diagnosis not present

## 2019-07-30 DIAGNOSIS — I10 Essential (primary) hypertension: Secondary | ICD-10-CM | POA: Diagnosis not present

## 2019-07-31 LAB — COMPLETE METABOLIC PANEL WITH GFR
AG Ratio: 1.8 (calc) (ref 1.0–2.5)
ALT: 20 U/L (ref 6–29)
AST: 19 U/L (ref 10–35)
Albumin: 4 g/dL (ref 3.6–5.1)
Alkaline phosphatase (APISO): 81 U/L (ref 37–153)
BUN: 14 mg/dL (ref 7–25)
CO2: 28 mmol/L (ref 20–32)
Calcium: 9.3 mg/dL (ref 8.6–10.4)
Chloride: 104 mmol/L (ref 98–110)
Creat: 0.82 mg/dL (ref 0.60–0.93)
GFR, Est African American: 83 mL/min/{1.73_m2} (ref >=60)
GFR, Est Non African American: 72 mL/min/{1.73_m2} (ref >=60)
Globulin: 2.2 g/dL (calc) (ref 1.9–3.7)
Glucose, Bld: 110 mg/dL — ABNORMAL HIGH (ref 65–99)
Potassium: 4 mmol/L (ref 3.5–5.3)
Sodium: 140 mmol/L (ref 135–146)
Total Bilirubin: 0.5 mg/dL (ref 0.2–1.2)
Total Protein: 6.2 g/dL (ref 6.1–8.1)

## 2019-07-31 LAB — VITAMIN B12: Vitamin B-12: 365 pg/mL (ref 200–1100)

## 2019-07-31 LAB — LIPID PANEL
Cholesterol: 218 mg/dL — ABNORMAL HIGH (ref <200)
HDL: 45 mg/dL — ABNORMAL LOW (ref >=50)
LDL Cholesterol (Calc): 144 mg/dL (calc) — ABNORMAL HIGH
Non-HDL Cholesterol (Calc): 173 mg/dL (calc) — ABNORMAL HIGH (ref <130)
Total CHOL/HDL Ratio: 4.8 (calc) (ref <5.0)
Triglycerides: 156 mg/dL — ABNORMAL HIGH (ref <150)

## 2019-08-01 ENCOUNTER — Encounter: Payer: Self-pay | Admitting: Nurse Practitioner

## 2019-08-01 DIAGNOSIS — G629 Polyneuropathy, unspecified: Secondary | ICD-10-CM | POA: Insufficient documentation

## 2019-08-01 DIAGNOSIS — M25562 Pain in left knee: Secondary | ICD-10-CM | POA: Insufficient documentation

## 2019-08-01 DIAGNOSIS — G8929 Other chronic pain: Secondary | ICD-10-CM | POA: Insufficient documentation

## 2019-08-16 DIAGNOSIS — R252 Cramp and spasm: Secondary | ICD-10-CM | POA: Diagnosis not present

## 2019-08-23 DIAGNOSIS — E039 Hypothyroidism, unspecified: Secondary | ICD-10-CM | POA: Diagnosis not present

## 2019-09-13 ENCOUNTER — Ambulatory Visit: Payer: Self-pay | Admitting: *Deleted

## 2019-09-13 ENCOUNTER — Telehealth: Payer: Medicare HMO

## 2019-09-13 DIAGNOSIS — I10 Essential (primary) hypertension: Secondary | ICD-10-CM

## 2019-09-13 NOTE — Chronic Care Management (AMB) (Signed)
  Chronic Care Management   Outreach Note  09/13/2019 Name: Margaret Townsend MRN: [id number] DOB: 1946/02/11  Referred by: Mikey College, NP Reason for referral : Chronic Care Management (Unsuccessful outreach x1 )   An unsuccessful telephone outreach was attempted today. The patient was referred to the case management team by for assistance with care management and care coordination.   Follow Up Plan: A HIPPA compliant phone message was left for the patient providing contact information and requesting a return call.  The care management team will reach out to the patient again over the next 30 days.   Merlene Morse Crecencio Kwiatek RN, BSN Nurse Case Pharmacist, [facility] Management  [phone]) Business Mobile

## 2019-09-23 DIAGNOSIS — G8929 Other chronic pain: Secondary | ICD-10-CM | POA: Diagnosis not present

## 2019-09-23 DIAGNOSIS — M25562 Pain in left knee: Secondary | ICD-10-CM | POA: Diagnosis not present

## 2019-09-23 DIAGNOSIS — M1712 Unilateral primary osteoarthritis, left knee: Secondary | ICD-10-CM | POA: Diagnosis not present

## 2019-09-26 DIAGNOSIS — M1712 Unilateral primary osteoarthritis, left knee: Secondary | ICD-10-CM | POA: Insufficient documentation

## 2019-10-05 DIAGNOSIS — M1712 Unilateral primary osteoarthritis, left knee: Secondary | ICD-10-CM | POA: Diagnosis not present

## 2019-10-06 DIAGNOSIS — E039 Hypothyroidism, unspecified: Secondary | ICD-10-CM | POA: Diagnosis not present

## 2019-10-07 ENCOUNTER — Telehealth: Payer: Self-pay

## 2019-10-14 ENCOUNTER — Telehealth: Payer: Self-pay

## 2019-11-15 DIAGNOSIS — E782 Mixed hyperlipidemia: Secondary | ICD-10-CM | POA: Diagnosis not present

## 2019-11-15 DIAGNOSIS — E039 Hypothyroidism, unspecified: Secondary | ICD-10-CM | POA: Diagnosis not present

## 2019-11-15 DIAGNOSIS — M1712 Unilateral primary osteoarthritis, left knee: Secondary | ICD-10-CM | POA: Diagnosis not present

## 2019-11-15 DIAGNOSIS — R7303 Prediabetes: Secondary | ICD-10-CM | POA: Diagnosis not present

## 2019-11-15 DIAGNOSIS — I10 Essential (primary) hypertension: Secondary | ICD-10-CM | POA: Diagnosis not present

## 2019-11-15 DIAGNOSIS — C50911 Malignant neoplasm of unspecified site of right female breast: Secondary | ICD-10-CM | POA: Diagnosis not present

## 2019-11-24 ENCOUNTER — Other Ambulatory Visit: Payer: Self-pay

## 2019-11-24 ENCOUNTER — Encounter
Admission: RE | Admit: 2019-11-24 | Discharge: 2019-11-24 | Disposition: A | Discharge status: Home / Self Care | Payer: Medicare HMO | Source: Ambulatory Visit | Attending: Orthopedic Surgery | Admitting: Orthopedic Surgery

## 2019-11-24 HISTORY — DX: Unspecified osteoarthritis, unspecified site: M19.90

## 2019-11-24 HISTORY — DX: Other specified postprocedural states: Z98.890

## 2019-11-24 HISTORY — DX: Nausea with vomiting, unspecified: R11.2

## 2019-11-24 NOTE — Patient Instructions (Addendum)
Your procedure is scheduled on: 12/01/2019 Wed Report to Same Day Surgery 2nd floor medical mall [facility] Entrance-take elevator on left to 2nd floor.  Check in with surgery information desk.) To find out your arrival time please call [phone] between 1PM - 3PM on 11/30/2019 Tues  Remember: Instructions that are not followed completely may result in serious medical risk, up to and including death, or upon the discretion of your surgeon and anesthesiologist your surgery may need to be rescheduled.    _x___ 1. Do not eat food after midnight the night before your procedure. You may drink clear liquids up to 2 hours before you are scheduled to arrive at the hospital for your procedure.  Do not drink clear liquids within 2 hours of your scheduled arrival to the hospital.  Clear liquids include  --Water or Apple juice without pulp  --Clear carbohydrate beverage such as ClearFast or Gatorade  --Black Coffee or Clear Tea (No milk, no creamers, do not add anything to                  the coffee or Tea Type 1 and type 2 diabetics should only drink water.   ____Ensure clear carbohydrate drink on the way to the hospital for bariatric patients  _x___Ensure clear carbohydrate drink 3 hours before surgery. Complete carbohydrate drink 1.5 hrs before coming to the hospital.   No gum chewing or hard candies.     __x__ 2. No Alcohol for 24 hours before or after surgery.   __x__3. No Smoking or e-cigarettes for 24 prior to surgery.  Do not use any chewable tobacco products for at least 6 hour prior to surgery   ____  4. Bring all medications with you on the day of surgery if instructed.    __x__ 5. Notify your doctor if there is any change in your medical condition     (cold, fever, infections).    x___6. On the morning of surgery brush your teeth with toothpaste and water.  You may rinse your mouth with mouth wash if you wish.  Do not swallow any toothpaste or mouthwash.   Do not wear  jewelry, make-up, hairpins, clips or nail polish.  Do not wear lotions, powders, or perfumes. You may wear deodorant.  Do not shave 48 hours prior to surgery. Men may shave face and neck.  Do not bring valuables to the hospital.    [facility] is not responsible for any belongings or valuables.               Contacts, dentures or bridgework may not be worn into surgery.  Leave your suitcase in the car. After surgery it may be brought to your room.  For patients admitted to the hospital, discharge time is determined by your                       treatment team.  _  Patients discharged the day of surgery will not be allowed to drive home.  You will need someone to drive you home and stay with you the night of your procedure.    Please read over the following fact sheets that you were given:   [facility] Preparing for Surgery and or MRSA Information   _x___ Take anti-hypertensive listed below, cardiac, seizure, asthma,     anti-reflux and psychiatric medicines. These include:  1. letrozole (FEMARA) 2.5 MG tablet  2.levothyroxine (SYNTHROID) 112 MCG tablet  3.omeprazole (PRILOSEC) 20 MG  capsule  4.  5.  6.  ____Fleets enema or Magnesium Citrate as directed.   _x___ Use CHG Soap or sage wipes as directed on instruction sheet   ____ Use inhalers on the day of surgery and bring to hospital day of surgery  ____ Stop Metformin and Janumet 2 days prior to surgery.    ____ Take 1/2 of usual insulin dose the night before surgery and none on the morning     surgery.   _x___ Follow recommendations from Cardiologist, Pulmonologist or PCP regarding          stopping Aspirin, Coumadin, Plavix ,Eliquis, Effient, or Pradaxa, and Pletal.  X____Stop Anti-inflammatories such as Advil, Aleve, Ibuprofen, Motrin, Naproxen, Naprosyn, Goodies powders or aspirin products. OK to take Tylenol and                          Celebrex.   _x___ Stop supplements until after surgery.  But may continue Vitamin D,  Vitamin B,       and multivitamin.   ____ Bring C-Pap to the hospital.

## 2019-11-24 NOTE — Pre-Procedure Instructions (Signed)
Incentive spirometry and carbohydrate drink given along with instructions.

## 2019-11-25 ENCOUNTER — Other Ambulatory Visit: Payer: Medicare HMO

## 2019-11-25 DIAGNOSIS — M1712 Unilateral primary osteoarthritis, left knee: Secondary | ICD-10-CM | POA: Diagnosis not present

## 2019-11-29 ENCOUNTER — Other Ambulatory Visit: Payer: Self-pay

## 2019-11-29 ENCOUNTER — Encounter
Admission: RE | Admit: 2019-11-29 | Discharge: 2019-11-29 | Disposition: A | Discharge status: Home / Self Care | Payer: Medicare HMO | Source: Ambulatory Visit | Attending: Orthopedic Surgery | Admitting: Orthopedic Surgery

## 2019-11-29 ENCOUNTER — Other Ambulatory Visit: Payer: Medicare HMO

## 2019-11-29 DIAGNOSIS — I10 Essential (primary) hypertension: Secondary | ICD-10-CM | POA: Diagnosis present

## 2019-11-29 DIAGNOSIS — Z90710 Acquired absence of both cervix and uterus: Secondary | ICD-10-CM | POA: Diagnosis not present

## 2019-11-29 DIAGNOSIS — M25762 Osteophyte, left knee: Secondary | ICD-10-CM | POA: Diagnosis present

## 2019-11-29 DIAGNOSIS — Z01818 Encounter for other preprocedural examination: Secondary | ICD-10-CM | POA: Insufficient documentation

## 2019-11-29 DIAGNOSIS — Z8041 Family history of malignant neoplasm of ovary: Secondary | ICD-10-CM | POA: Diagnosis not present

## 2019-11-29 DIAGNOSIS — Z809 Family history of malignant neoplasm, unspecified: Secondary | ICD-10-CM | POA: Diagnosis not present

## 2019-11-29 DIAGNOSIS — Z8249 Family history of ischemic heart disease and other diseases of the circulatory system: Secondary | ICD-10-CM | POA: Diagnosis not present

## 2019-11-29 DIAGNOSIS — Z8349 Family history of other endocrine, nutritional and metabolic diseases: Secondary | ICD-10-CM | POA: Diagnosis not present

## 2019-11-29 DIAGNOSIS — Z471 Aftercare following joint replacement surgery: Secondary | ICD-10-CM | POA: Diagnosis not present

## 2019-11-29 DIAGNOSIS — Z853 Personal history of malignant neoplasm of breast: Secondary | ICD-10-CM | POA: Diagnosis not present

## 2019-11-29 DIAGNOSIS — Z9049 Acquired absence of other specified parts of digestive tract: Secondary | ICD-10-CM | POA: Diagnosis not present

## 2019-11-29 DIAGNOSIS — Z01810 Encounter for preprocedural cardiovascular examination: Secondary | ICD-10-CM | POA: Diagnosis not present

## 2019-11-29 DIAGNOSIS — E785 Hyperlipidemia, unspecified: Secondary | ICD-10-CM | POA: Diagnosis present

## 2019-11-29 DIAGNOSIS — M1712 Unilateral primary osteoarthritis, left knee: Secondary | ICD-10-CM | POA: Diagnosis present

## 2019-11-29 DIAGNOSIS — E079 Disorder of thyroid, unspecified: Secondary | ICD-10-CM | POA: Insufficient documentation

## 2019-11-29 DIAGNOSIS — Z79890 Hormone replacement therapy (postmenopausal): Secondary | ICD-10-CM | POA: Diagnosis not present

## 2019-11-29 DIAGNOSIS — Z79899 Other long term (current) drug therapy: Secondary | ICD-10-CM | POA: Diagnosis not present

## 2019-11-29 DIAGNOSIS — Z823 Family history of stroke: Secondary | ICD-10-CM | POA: Diagnosis not present

## 2019-11-29 DIAGNOSIS — Z20828 Contact with and (suspected) exposure to other viral communicable diseases: Secondary | ICD-10-CM | POA: Diagnosis present

## 2019-11-29 DIAGNOSIS — Z96652 Presence of left artificial knee joint: Secondary | ICD-10-CM | POA: Diagnosis present

## 2019-11-29 DIAGNOSIS — K219 Gastro-esophageal reflux disease without esophagitis: Secondary | ICD-10-CM | POA: Diagnosis present

## 2019-11-29 LAB — TYPE AND SCREEN
ABO/RH(D): O POS
Antibody Screen: NEGATIVE

## 2019-11-29 LAB — URINALYSIS, ROUTINE W REFLEX MICROSCOPIC
Bacteria, UA: NONE SEEN
Bilirubin Urine: NEGATIVE
Glucose, UA: NEGATIVE mg/dL
Hgb urine dipstick: NEGATIVE
Ketones, ur: NEGATIVE mg/dL
Nitrite: NEGATIVE
Protein, ur: NEGATIVE mg/dL
Specific Gravity, Urine: 1.021 (ref 1.005–1.030)
pH: 5 (ref 5.0–8.0)

## 2019-11-29 LAB — PROTIME-INR
INR: 1 (ref 0.8–1.2)
Prothrombin Time: 12.9 seconds (ref 11.4–15.2)

## 2019-11-29 LAB — CBC
HCT: 40.8 % (ref 36.0–46.0)
Hemoglobin: 13.4 g/dL (ref 12.0–15.0)
MCH: 30.1 pg (ref 26.0–34.0)
MCHC: 32.8 g/dL (ref 30.0–36.0)
MCV: 91.7 fL (ref 80.0–100.0)
Platelets: 221 10*3/uL (ref 150–400)
RBC: 4.45 MIL/uL (ref 3.87–5.11)
RDW: 13.3 % (ref 11.5–15.5)
WBC: 6 10*3/uL (ref 4.0–10.5)
nRBC: 0 % (ref 0.0–0.2)

## 2019-11-29 LAB — SURGICAL PCR SCREEN
MRSA, PCR: NEGATIVE
Staphylococcus aureus: NEGATIVE

## 2019-11-29 LAB — APTT: aPTT: 26 seconds (ref 24–36)

## 2019-11-29 LAB — SEDIMENTATION RATE: Sed Rate: 22 mm/hr (ref 0–30)

## 2019-11-29 LAB — C-REACTIVE PROTEIN: CRP: 0.9 mg/dL (ref <1.0)

## 2019-11-30 LAB — URINE CULTURE
Culture: <10000 — AB
Special Requests: NORMAL

## 2019-11-30 LAB — SARS CORONAVIRUS 2 (TAT 6-24 HRS): SARS Coronavirus 2: NEGATIVE

## 2019-12-01 ENCOUNTER — Inpatient Hospital Stay: Payer: Medicare HMO

## 2019-12-01 ENCOUNTER — Inpatient Hospital Stay
Admission: RE | Admit: 2019-12-01 | Discharge: 2019-12-01 | DRG: 470 | Disposition: A | Discharge status: Home / Self Care | Payer: Medicare HMO | Attending: Orthopedic Surgery | Admitting: Orthopedic Surgery

## 2019-12-01 ENCOUNTER — Other Ambulatory Visit: Payer: Self-pay

## 2019-12-01 ENCOUNTER — Encounter: Payer: Self-pay | Admitting: Orthopedic Surgery

## 2019-12-01 ENCOUNTER — Ambulatory Visit: Payer: Medicare HMO | Admitting: Certified Registered Nurse Anesthetist

## 2019-12-01 ENCOUNTER — Encounter
Admission: RE | Disposition: A | Discharge status: Home / Self Care | Payer: Self-pay | Source: Home / Self Care | Attending: Orthopedic Surgery

## 2019-12-01 DIAGNOSIS — K219 Gastro-esophageal reflux disease without esophagitis: Secondary | ICD-10-CM | POA: Diagnosis present

## 2019-12-01 DIAGNOSIS — E785 Hyperlipidemia, unspecified: Secondary | ICD-10-CM | POA: Diagnosis present

## 2019-12-01 DIAGNOSIS — Z8249 Family history of ischemic heart disease and other diseases of the circulatory system: Secondary | ICD-10-CM

## 2019-12-01 DIAGNOSIS — Z8349 Family history of other endocrine, nutritional and metabolic diseases: Secondary | ICD-10-CM

## 2019-12-01 DIAGNOSIS — M25762 Osteophyte, left knee: Secondary | ICD-10-CM | POA: Diagnosis present

## 2019-12-01 DIAGNOSIS — Z8041 Family history of malignant neoplasm of ovary: Secondary | ICD-10-CM | POA: Diagnosis not present

## 2019-12-01 DIAGNOSIS — Z79890 Hormone replacement therapy (postmenopausal): Secondary | ICD-10-CM | POA: Diagnosis not present

## 2019-12-01 DIAGNOSIS — Z471 Aftercare following joint replacement surgery: Secondary | ICD-10-CM | POA: Diagnosis not present

## 2019-12-01 DIAGNOSIS — Z20828 Contact with and (suspected) exposure to other viral communicable diseases: Secondary | ICD-10-CM | POA: Diagnosis present

## 2019-12-01 DIAGNOSIS — Z79899 Other long term (current) drug therapy: Secondary | ICD-10-CM

## 2019-12-01 DIAGNOSIS — Z96659 Presence of unspecified artificial knee joint: Secondary | ICD-10-CM

## 2019-12-01 DIAGNOSIS — Z823 Family history of stroke: Secondary | ICD-10-CM | POA: Diagnosis not present

## 2019-12-01 DIAGNOSIS — Z853 Personal history of malignant neoplasm of breast: Secondary | ICD-10-CM

## 2019-12-01 DIAGNOSIS — M1712 Unilateral primary osteoarthritis, left knee: Secondary | ICD-10-CM | POA: Diagnosis present

## 2019-12-01 DIAGNOSIS — I10 Essential (primary) hypertension: Secondary | ICD-10-CM | POA: Diagnosis present

## 2019-12-01 DIAGNOSIS — Z9049 Acquired absence of other specified parts of digestive tract: Secondary | ICD-10-CM

## 2019-12-01 DIAGNOSIS — Z809 Family history of malignant neoplasm, unspecified: Secondary | ICD-10-CM | POA: Diagnosis not present

## 2019-12-01 DIAGNOSIS — Z90710 Acquired absence of both cervix and uterus: Secondary | ICD-10-CM | POA: Diagnosis not present

## 2019-12-01 DIAGNOSIS — Z96652 Presence of left artificial knee joint: Secondary | ICD-10-CM | POA: Diagnosis present

## 2019-12-01 HISTORY — PX: KNEE ARTHROPLASTY: SHX992

## 2019-12-01 LAB — COMPREHENSIVE METABOLIC PANEL
ALT: 32 U/L (ref 0–44)
AST: 28 U/L (ref 15–41)
Albumin: 4 g/dL (ref 3.5–5.0)
Alkaline Phosphatase: 70 U/L (ref 38–126)
Anion gap: 10 (ref 5–15)
BUN: 16 mg/dL (ref 8–23)
CO2: 27 mmol/L (ref 22–32)
Calcium: 9 mg/dL (ref 8.9–10.3)
Chloride: 103 mmol/L (ref 98–111)
Creatinine, Ser: 0.91 mg/dL (ref 0.44–1.00)
GFR calc Af Amer: >60 mL/min (ref >60)
GFR calc non Af Amer: >60 mL/min (ref >60)
Glucose, Bld: 132 mg/dL — ABNORMAL HIGH (ref 70–99)
Potassium: 3.9 mmol/L (ref 3.5–5.1)
Sodium: 140 mmol/L (ref 135–145)
Total Bilirubin: 0.6 mg/dL (ref 0.3–1.2)
Total Protein: 7 g/dL (ref 6.5–8.1)

## 2019-12-01 LAB — ABO/RH: ABO/RH(D): O POS

## 2019-12-01 SURGERY — ARTHROPLASTY, KNEE, TOTAL, USING IMAGELESS COMPUTER-ASSISTED NAVIGATION
Anesthesia: Spinal | Site: Knee | Laterality: Left

## 2019-12-01 MED ORDER — ENOXAPARIN SODIUM 40 MG/0.4ML ~~LOC~~ SOLN: 40.0000 mg | SUBCUTANEOUS | 0 refills | Status: AC

## 2019-12-01 MED ORDER — MENTHOL 3 MG MT LOZG
1.0000 | LOZENGE | OROMUCOSAL | Status: DC | PRN
  Filled 2019-12-01: qty 9

## 2019-12-01 MED ORDER — PANTOPRAZOLE SODIUM 40 MG PO TBEC: 40.0000 mg | DELAYED_RELEASE_TABLET | Freq: Two times a day (BID) | ORAL | Status: DC

## 2019-12-01 MED ORDER — ALUM & MAG HYDROXIDE-SIMETH 200-200-20 MG/5ML PO SUSP: 30.0000 mL | ORAL | Status: DC | PRN

## 2019-12-01 MED ORDER — FENTANYL CITRATE (PF) 100 MCG/2ML IJ SOLN
INTRAMUSCULAR | Status: AC
  Filled 2019-12-01: qty 2

## 2019-12-01 MED ORDER — CLINDAMYCIN PHOSPHATE 600 MG/50ML IV SOLN
INTRAVENOUS | Status: AC
  Administered 2019-12-01: 14:00:00 600 mg via INTRAVENOUS
  Filled 2019-12-01: qty 50

## 2019-12-01 MED ORDER — PROPOFOL 500 MG/50ML IV EMUL
INTRAVENOUS | Status: AC
  Filled 2019-12-01: qty 50

## 2019-12-01 MED ORDER — ONDANSETRON HCL 4 MG PO TABS: 4.0000 mg | ORAL_TABLET | Freq: Four times a day (QID) | ORAL | Status: DC | PRN

## 2019-12-01 MED ORDER — TRANEXAMIC ACID-NACL 1000-0.7 MG/100ML-% IV SOLN
INTRAVENOUS | Status: AC
  Filled 2019-12-01: qty 100

## 2019-12-01 MED ORDER — METOCLOPRAMIDE HCL 5 MG/ML IJ SOLN
INTRAMUSCULAR | Status: AC
  Filled 2019-12-01: qty 2

## 2019-12-01 MED ORDER — CELECOXIB 200 MG PO CAPS: 200.0000 mg | ORAL_CAPSULE | Freq: Two times a day (BID) | ORAL | 2 refills | Status: AC

## 2019-12-01 MED ORDER — BUPIVACAINE HCL (PF) 0.25 % IJ SOLN
INTRAMUSCULAR | Status: AC
  Filled 2019-12-01: qty 60

## 2019-12-01 MED ORDER — SODIUM CHLORIDE 0.9 % IV SOLN
INTRAVENOUS | Status: DC | PRN
  Administered 2019-12-01: 60 mL

## 2019-12-01 MED ORDER — ACETAMINOPHEN 10 MG/ML IV SOLN
INTRAVENOUS | Status: AC
  Filled 2019-12-01: qty 100

## 2019-12-01 MED ORDER — TRAMADOL HCL 50 MG PO TABS
ORAL_TABLET | ORAL | Status: AC
  Administered 2019-12-01: 50 mg via ORAL
  Filled 2019-12-01: qty 1

## 2019-12-01 MED ORDER — METOCLOPRAMIDE HCL 10 MG PO TABS: 10.0000 mg | ORAL_TABLET | Freq: Three times a day (TID) | ORAL | Status: DC

## 2019-12-01 MED ORDER — LACTATED RINGERS IV SOLN: INTRAVENOUS | Status: DC

## 2019-12-01 MED ORDER — ACETAMINOPHEN 10 MG/ML IV SOLN
INTRAVENOUS | Status: DC | PRN
  Administered 2019-12-01: 1000 mg via INTRAVENOUS

## 2019-12-01 MED ORDER — METOCLOPRAMIDE HCL 5 MG/ML IJ SOLN
5.0000 mg | Freq: Three times a day (TID) | INTRAMUSCULAR | Status: DC | PRN
  Administered 2019-12-01: 13:00:00 5 mg via INTRAVENOUS

## 2019-12-01 MED ORDER — OXYCODONE HCL 5 MG PO TABS: 5.0000 mg | ORAL_TABLET | ORAL | Status: DC | PRN

## 2019-12-01 MED ORDER — PHENYLEPHRINE HCL (PRESSORS) 10 MG/ML IV SOLN
INTRAVENOUS | Status: AC
  Filled 2019-12-01: qty 1

## 2019-12-01 MED ORDER — FENTANYL CITRATE (PF) 100 MCG/2ML IJ SOLN: 25.0000 ug | INTRAMUSCULAR | Status: DC | PRN

## 2019-12-01 MED ORDER — SODIUM CHLORIDE 0.9 % IV SOLN: INTRAVENOUS | Status: DC

## 2019-12-01 MED ORDER — TRANEXAMIC ACID-NACL 1000-0.7 MG/100ML-% IV SOLN
1000.0000 mg | Freq: Once | INTRAVENOUS | Status: AC
  Administered 2019-12-01: 1000 mg via INTRAVENOUS

## 2019-12-01 MED ORDER — MIDAZOLAM HCL 5 MG/5ML IJ SOLN
INTRAMUSCULAR | Status: DC | PRN
  Administered 2019-12-01: 2 mg via INTRAVENOUS

## 2019-12-01 MED ORDER — DEXAMETHASONE SODIUM PHOSPHATE 10 MG/ML IJ SOLN
INTRAMUSCULAR | Status: AC
  Administered 2019-12-01: 8 mg via INTRAVENOUS
  Filled 2019-12-01: qty 1

## 2019-12-01 MED ORDER — BUPIVACAINE HCL (PF) 0.25 % IJ SOLN
INTRAMUSCULAR | Status: DC | PRN
  Administered 2019-12-01: 60 mL

## 2019-12-01 MED ORDER — GABAPENTIN 300 MG PO CAPS: 300.0000 mg | ORAL_CAPSULE | Freq: Once | ORAL | Status: AC

## 2019-12-01 MED ORDER — GABAPENTIN 300 MG PO CAPS
ORAL_CAPSULE | ORAL | Status: AC
  Administered 2019-12-01: 300 mg via ORAL
  Filled 2019-12-01: qty 1

## 2019-12-01 MED ORDER — DEXMEDETOMIDINE HCL IN NACL 80 MCG/20ML IV SOLN
INTRAVENOUS | Status: AC
  Filled 2019-12-01: qty 20

## 2019-12-01 MED ORDER — DIPHENHYDRAMINE HCL 12.5 MG/5ML PO ELIX: 12.5000 mg | ORAL_SOLUTION | ORAL | Status: DC | PRN

## 2019-12-01 MED ORDER — BUPIVACAINE HCL (PF) 0.5 % IJ SOLN
INTRAMUSCULAR | Status: AC
  Filled 2019-12-01: qty 10

## 2019-12-01 MED ORDER — TRAMADOL HCL 50 MG PO TABS: 50.0000 mg | ORAL_TABLET | Freq: Four times a day (QID) | ORAL | 0 refills | Status: AC | PRN

## 2019-12-01 MED ORDER — SENNOSIDES-DOCUSATE SODIUM 8.6-50 MG PO TABS: 1.0000 | ORAL_TABLET | Freq: Two times a day (BID) | ORAL | Status: DC

## 2019-12-01 MED ORDER — PHENOL 1.4 % MT LIQD
1.0000 | OROMUCOSAL | Status: DC | PRN
  Filled 2019-12-01: qty 177

## 2019-12-01 MED ORDER — OXYCODONE HCL 5 MG PO TABS: 5.0000 mg | ORAL_TABLET | ORAL | 0 refills | Status: AC | PRN

## 2019-12-01 MED ORDER — OXYCODONE HCL 5 MG PO TABS: 10.0000 mg | ORAL_TABLET | ORAL | Status: DC | PRN

## 2019-12-01 MED ORDER — BUPIVACAINE LIPOSOME 1.3 % IJ SUSP
INTRAMUSCULAR | Status: AC
  Filled 2019-12-01: qty 20

## 2019-12-01 MED ORDER — METOCLOPRAMIDE HCL 10 MG PO TABS: 5.0000 mg | ORAL_TABLET | Freq: Three times a day (TID) | ORAL | Status: DC | PRN

## 2019-12-01 MED ORDER — ACETAMINOPHEN 325 MG PO TABS: 325.0000 mg | ORAL_TABLET | Freq: Four times a day (QID) | ORAL | Status: DC | PRN

## 2019-12-01 MED ORDER — CELECOXIB 200 MG PO CAPS: 200.0000 mg | ORAL_CAPSULE | Freq: Two times a day (BID) | ORAL | Status: DC

## 2019-12-01 MED ORDER — CLINDAMYCIN PHOSPHATE 900 MG/50ML IV SOLN
900.0000 mg | INTRAVENOUS | Status: AC
  Administered 2019-12-01: 900 mg via INTRAVENOUS

## 2019-12-01 MED ORDER — BISACODYL 10 MG RE SUPP: 10.0000 mg | Freq: Every day | RECTAL | Status: DC | PRN

## 2019-12-01 MED ORDER — PROPOFOL 10 MG/ML IV BOLUS
INTRAVENOUS | Status: AC
  Filled 2019-12-01: qty 20

## 2019-12-01 MED ORDER — FERROUS SULFATE 325 (65 FE) MG PO TABS: 325.0000 mg | ORAL_TABLET | Freq: Two times a day (BID) | ORAL | Status: DC

## 2019-12-01 MED ORDER — ONDANSETRON HCL 4 MG/2ML IJ SOLN
INTRAMUSCULAR | Status: DC | PRN
  Administered 2019-12-01: 4 mg via INTRAVENOUS

## 2019-12-01 MED ORDER — MAGNESIUM HYDROXIDE 400 MG/5ML PO SUSP: 30.0000 mL | Freq: Every day | ORAL | Status: DC

## 2019-12-01 MED ORDER — NEOMYCIN-POLYMYXIN B GU 40-200000 IR SOLN
Status: AC
  Filled 2019-12-01: qty 20

## 2019-12-01 MED ORDER — MIDAZOLAM HCL 2 MG/2ML IJ SOLN
INTRAMUSCULAR | Status: AC
  Filled 2019-12-01: qty 2

## 2019-12-01 MED ORDER — BUPIVACAINE HCL (PF) 0.5 % IJ SOLN
INTRAMUSCULAR | Status: DC | PRN
  Administered 2019-12-01: 2.5 mL via INTRATHECAL

## 2019-12-01 MED ORDER — SODIUM CHLORIDE 0.9 % IV SOLN: Freq: Once | INTRAVENOUS | Status: AC

## 2019-12-01 MED ORDER — TRANEXAMIC ACID-NACL 1000-0.7 MG/100ML-% IV SOLN
1000.0000 mg | INTRAVENOUS | Status: AC
  Administered 2019-12-01: 1000 mg via INTRAVENOUS

## 2019-12-01 MED ORDER — CLINDAMYCIN PHOSPHATE 900 MG/50ML IV SOLN
INTRAVENOUS | Status: AC
  Filled 2019-12-01: qty 50

## 2019-12-01 MED ORDER — CELECOXIB 200 MG PO CAPS
ORAL_CAPSULE | ORAL | Status: AC
  Administered 2019-12-01: 07:00:00 400 mg via ORAL
  Filled 2019-12-01: qty 2

## 2019-12-01 MED ORDER — ONDANSETRON HCL 4 MG/2ML IJ SOLN: 4.0000 mg | Freq: Four times a day (QID) | INTRAMUSCULAR | Status: DC | PRN

## 2019-12-01 MED ORDER — SODIUM CHLORIDE FLUSH 0.9 % IV SOLN
INTRAVENOUS | Status: AC
  Filled 2019-12-01: qty 40

## 2019-12-01 MED ORDER — ENOXAPARIN SODIUM 40 MG/0.4ML ~~LOC~~ SOLN: 40.0000 mg | SUBCUTANEOUS | Status: DC

## 2019-12-01 MED ORDER — CLINDAMYCIN PHOSPHATE 600 MG/50ML IV SOLN: 600.0000 mg | Freq: Four times a day (QID) | INTRAVENOUS | Status: DC

## 2019-12-01 MED ORDER — ONDANSETRON HCL 4 MG/2ML IJ SOLN: 4.0000 mg | Freq: Once | INTRAMUSCULAR | Status: DC | PRN

## 2019-12-01 MED ORDER — TRAMADOL HCL 50 MG PO TABS: 50.0000 mg | ORAL_TABLET | ORAL | Status: DC | PRN

## 2019-12-01 MED ORDER — CELECOXIB 200 MG PO CAPS: 400.0000 mg | ORAL_CAPSULE | Freq: Once | ORAL | Status: AC

## 2019-12-01 MED ORDER — DEXAMETHASONE SODIUM PHOSPHATE 10 MG/ML IJ SOLN: 8.0000 mg | Freq: Once | INTRAMUSCULAR | Status: AC

## 2019-12-01 MED ORDER — FLEET ENEMA 7-19 GM/118ML RE ENEM: 1.0000 | ENEMA | Freq: Once | RECTAL | Status: DC | PRN

## 2019-12-01 MED ORDER — NEOMYCIN-POLYMYXIN B GU 40-200000 IR SOLN
Status: DC | PRN
  Administered 2019-12-01: 16 mL

## 2019-12-01 MED ORDER — ACETAMINOPHEN 10 MG/ML IV SOLN: 1000.0000 mg | Freq: Four times a day (QID) | INTRAVENOUS | Status: DC

## 2019-12-01 MED ORDER — CHLORHEXIDINE GLUCONATE 4 % EX LIQD: 60.0000 mL | Freq: Once | CUTANEOUS | Status: DC

## 2019-12-01 MED ORDER — ROCURONIUM BROMIDE 50 MG/5ML IV SOLN
INTRAVENOUS | Status: AC
  Filled 2019-12-01: qty 1

## 2019-12-01 MED ORDER — SUGAMMADEX SODIUM 200 MG/2ML IV SOLN
INTRAVENOUS | Status: AC
  Filled 2019-12-01: qty 2

## 2019-12-01 MED ORDER — ENSURE ENLIVE PO LIQD: 296.0000 mL | Freq: Once | ORAL | Status: DC

## 2019-12-01 MED ORDER — PROPOFOL 500 MG/50ML IV EMUL
INTRAVENOUS | Status: DC | PRN
  Administered 2019-12-01: 50 ug/kg/min via INTRAVENOUS

## 2019-12-01 MED ORDER — HYDROMORPHONE HCL 1 MG/ML IJ SOLN: 0.5000 mg | INTRAMUSCULAR | Status: DC | PRN

## 2019-12-01 MED ORDER — SODIUM CHLORIDE 0.9 % IV SOLN
INTRAVENOUS | Status: DC | PRN
  Administered 2019-12-01: 10 ug/min via INTRAVENOUS

## 2019-12-01 SURGICAL SUPPLY — 78 items
ATTUNE PS FEM LT SZ 4 CEM KNEE (Femur) ×3 IMPLANT
ATTUNE PSRP INSR SZ4 10 KNEE (Insert) ×2 IMPLANT
ATTUNE PSRP INSR SZ4 10MM KNEE (Insert) ×1 IMPLANT
BASEPLATE TIBIAL ROTATING SZ 4 (Knees) ×3 IMPLANT
BATTERY INSTRU NAVIGATION (MISCELLANEOUS) ×12 IMPLANT
BLADE SAW 70X12.5 (BLADE) ×3 IMPLANT
BLADE SAW 90X13X1.19 OSCILLAT (BLADE) ×3 IMPLANT
BLADE SAW 90X25X1.19 OSCILLAT (BLADE) ×3 IMPLANT
BONE CEMENT GENTAMICIN (Cement) ×6 IMPLANT
CANISTER SUCT 3000ML PPV (MISCELLANEOUS) ×3 IMPLANT
CEMENT BONE GENTAMICIN 40 (Cement) ×2 IMPLANT
COOLER POLAR GLACIER W/PUMP (MISCELLANEOUS) ×3 IMPLANT
COVER WAND RF STERILE (DRAPES) ×3 IMPLANT
CUFF TOURN SGL QUICK 30 (TOURNIQUET CUFF) ×2
CUFF TRNQT CYL 30X4X21-28X (TOURNIQUET CUFF) ×1 IMPLANT
DRAPE 3/4 80X56 (DRAPES) ×3 IMPLANT
DRAPE POUCH INSTRU U-SHP 10X18 (DRAPES) ×3 IMPLANT
DRSG DERMACEA 8X12 NADH (GAUZE/BANDAGES/DRESSINGS) ×3 IMPLANT
DRSG OPSITE POSTOP 4X14 (GAUZE/BANDAGES/DRESSINGS) ×3 IMPLANT
DRSG TEGADERM 4X4.75 (GAUZE/BANDAGES/DRESSINGS) ×3 IMPLANT
DURAPREP 26ML APPLICATOR (WOUND CARE) ×6 IMPLANT
ELECT REM PT RETURN 9FT ADLT (ELECTROSURGICAL) ×3
ELECTRODE REM PT RTRN 9FT ADLT (ELECTROSURGICAL) ×1 IMPLANT
EX-PIN ORTHOLOCK NAV 4X150 (PIN) ×6 IMPLANT
GAUZE SPONGE 4X4 12PLY STRL (GAUZE/BANDAGES/DRESSINGS) ×3 IMPLANT
GAUZE XEROFORM 1X8 LF (GAUZE/BANDAGES/DRESSINGS) ×3 IMPLANT
GLOVE BIO SURGEON STRL SZ7.5 (GLOVE) ×6 IMPLANT
GLOVE BIOGEL M STRL SZ7.5 (GLOVE) ×6 IMPLANT
GLOVE BIOGEL PI IND STRL 7.5 (GLOVE) ×1 IMPLANT
GLOVE BIOGEL PI INDICATOR 7.5 (GLOVE) ×2
GLOVE INDICATOR 8.0 STRL GRN (GLOVE) ×3 IMPLANT
GOWN STRL REUS W/ TWL LRG LVL3 (GOWN DISPOSABLE) ×2 IMPLANT
GOWN STRL REUS W/ TWL XL LVL3 (GOWN DISPOSABLE) ×1 IMPLANT
GOWN STRL REUS W/TWL LRG LVL3 (GOWN DISPOSABLE) ×4
GOWN STRL REUS W/TWL XL LVL3 (GOWN DISPOSABLE) ×2
HEMOVAC 400CC 10FR (MISCELLANEOUS) ×3 IMPLANT
HOLDER FOLEY CATH W/STRAP (MISCELLANEOUS) ×3 IMPLANT
HOOD PEEL AWAY FLYTE STAYCOOL (MISCELLANEOUS) ×6 IMPLANT
KIT TURNOVER KIT A (KITS) ×3 IMPLANT
KNIFE SCULPS 14X20 (INSTRUMENTS) ×3 IMPLANT
LABEL OR SOLS (LABEL) ×3 IMPLANT
MANIFOLD NEPTUNE II (INSTRUMENTS) ×3 IMPLANT
NDL SAFETY ECLIPSE 18X1.5 (NEEDLE) ×1 IMPLANT
NEEDLE HYPO 18GX1.5 SHARP (NEEDLE) ×2
NEEDLE SPNL 20GX3.5 QUINCKE YW (NEEDLE) ×6 IMPLANT
NS IRRIG 500ML POUR BTL (IV SOLUTION) ×3 IMPLANT
PACK TOTAL KNEE (MISCELLANEOUS) ×3 IMPLANT
PAD ABD DERMACEA PRESS 5X9 (GAUZE/BANDAGES/DRESSINGS) ×3 IMPLANT
PAD CAST CTTN 4X4 STRL (SOFTGOODS) ×1 IMPLANT
PAD WRAPON POLAR KNEE (MISCELLANEOUS) ×1 IMPLANT
PADDING CAST COTTON 4X4 STRL (SOFTGOODS) ×2
PATELLA MEDIAL ATTUN 35MM KNEE (Knees) ×3 IMPLANT
PENCIL SMOKE EVACUATOR COATED (MISCELLANEOUS) ×3 IMPLANT
PENCIL SMOKE ULTRAEVAC 22 CON (MISCELLANEOUS) ×3 IMPLANT
PIN DRILL QUICK PACK ×3 IMPLANT
PIN FIXATION 1/8DIA X 3INL (PIN) ×9 IMPLANT
PULSAVAC PLUS IRRIG FAN TIP (DISPOSABLE) ×3
SOL .9 NS 3000ML IRR  AL (IV SOLUTION) ×2
SOL .9 NS 3000ML IRR UROMATIC (IV SOLUTION) ×1 IMPLANT
SOL PREP PVP 2OZ (MISCELLANEOUS) ×3
SOLUTION PREP PVP 2OZ (MISCELLANEOUS) ×1 IMPLANT
SPONGE DRAIN TRACH 4X4 STRL 2S (GAUZE/BANDAGES/DRESSINGS) ×3 IMPLANT
STAPLER SKIN PROX 35W (STAPLE) ×3 IMPLANT
STOCKINETTE BIAS CUT 6 980064 (GAUZE/BANDAGES/DRESSINGS) ×3 IMPLANT
STOCKINETTE IMPERV 14X48 (MISCELLANEOUS) ×3 IMPLANT
STRAP TIBIA SHORT (MISCELLANEOUS) ×3 IMPLANT
SUCTION FRAZIER HANDLE 10FR (MISCELLANEOUS) ×2
SUCTION TUBE FRAZIER 10FR DISP (MISCELLANEOUS) ×1 IMPLANT
SUT VIC AB 0 CT1 36 (SUTURE) ×6 IMPLANT
SUT VIC AB 1 CT1 36 (SUTURE) ×6 IMPLANT
SUT VIC AB 2-0 CT2 27 (SUTURE) ×3 IMPLANT
SYR 20ML LL LF (SYRINGE) ×3 IMPLANT
SYR 30ML LL (SYRINGE) ×6 IMPLANT
TIP FAN IRRIG PULSAVAC PLUS (DISPOSABLE) ×1 IMPLANT
TOWEL OR 17X26 4PK STRL BLUE (TOWEL DISPOSABLE) ×3 IMPLANT
TOWER CARTRIDGE SMART MIX (DISPOSABLE) ×3 IMPLANT
TRAY FOLEY MTR SLVR 16FR STAT (SET/KITS/TRAYS/PACK) ×3 IMPLANT
WRAPON POLAR PAD KNEE (MISCELLANEOUS) ×3

## 2019-12-01 NOTE — OR Nursing (Signed)
Patient with PT will be moved to phase two after therapy session.

## 2019-12-01 NOTE — Anesthesia Procedure Notes (Signed)
Spinal  Patient location during procedure: OR Start time: 12/01/2019 7:18 AM End time: 12/01/2019 7:33 AM Staffing Performed: resident/CRNA and anesthesiologist  Anesthesiologist: Karenz, Andrew, MD Resident/CRNA: Wolf, Kristina C, CRNA Preanesthetic Checklist Completed: patient identified, IV checked, site marked, risks and benefits discussed, surgical consent, monitors and equipment checked, pre-op evaluation and timeout performed Spinal Block Patient position: sitting Prep: DuraPrep Patient monitoring: heart rate, cardiac monitor, continuous pulse ox and blood pressure Approach: midline Location: L3-4 Injection technique: single-shot Needle Needle type: Sprotte  Needle gauge: 24 G Needle length: 9 cm Assessment Sensory level: T4 Additional Notes IV functioning, monitors applied to pt. Expiration date of kit checked and confirmed to be in date. Sterile prep and drape, hand hygiene and sterile gloved used. Pt was positioned and spine was prepped in sterile fashion. Skin was anesthetized with lidocaine. Free flow of clear CSF obtained prior to injecting local anesthetic into CSF x 1 attempt. Spinal needle aspirated freely following injection. Needle was carefully withdrawn, and pt tolerated procedure well. Loss of motor and sensory on exam post injection.      

## 2019-12-01 NOTE — H&P (Signed)
ORTHOPAEDIC HISTORY & PHYSICAL Progress Notes by [name], PA at 11/25/2019 10:30 AM   Margaret Townsend Chief Complaint:       Chief Complaint  Patient presents with  . Knee Pain    Left knee, H & P left knee 12.30.20    History of Present Illness:    Margaret Townsend is a 73 y.o. female that presents to clinic today for her preoperative history and evaluation.  Patient presents unaccompanied. The patient is scheduled to undergo a left total knee arthroplasty on 12/01/19 by Dr. Marry Guan. Her pain began several years ago.  The pain is located primarily over the lateral aspect of the knee.  Her pain is worse with weightbearing.  She rates her pain a 1/10 when nonweightbearing and a 6/10 when walking. She reports associated swelling and some giving way of the knee.  She denies associated numbness or tingling or locking of the knee.  The patient's symptoms have progressed to the point that they decrease her quality of life. The patient has previously undergone conservative treatment including NSAIDS and injections to the knee without adequate control of her symptoms.  She states that she actually felt an increase in her pain after the viscosupplementation injections.  Patient denies any history of blood clots or significant cardiac history.  Patient states she was evaluated by Dr. Ubaldo Glassing several years ago and that all of her test came back clean.  Of note, patient does have a history of a mastectomy with lymph node removal on the right side, and so will need to have her blood pressure taken on the left arm during surgery.   Past Medical, Surgical, Family, Social History, Allergies, Medications:   Past Medical History:      Past Medical History:  Diagnosis Date  . Breast cancer (CMS-HCC)    right  . Cataract cortical, senile   . GERD (gastroesophageal reflux disease)   . Hyperlipidemia   . Hypertension   . Thyroid disease      Past Surgical History:       Past Surgical History:  Procedure Laterality Date  . ARTHROSCOPIC REPAIR ACL Left 1977  . CATARACT EXTRACTION Left 05/06/2018  . CATARACT EXTRACTION W/  INTRAOCULAR LENS IMPLANT Right 05/20/2018  . CHOLECYSTECTOMY  1987  . CYSTECTOMY  1999  . HYSTERECTOMY  2005  . MASTECTOMY Right 08/02/2014  . ruptured disc  2000    Current Medications:  Current Medications        Current Outpatient Medications  Medication Sig Dispense Refill  . cholecalciferol (CHOLECALCIFEROL) 1,000 unit tablet Take by mouth once daily. Reported on 01/19/2016     . cyanocobalamin (VITAMIN B12) 1000 MCG tablet Take 1,000 mcg by mouth once daily       . hydroCHLOROthiazide (HYDRODIURIL) 12.5 MG tablet Take 12.5 mg by mouth once daily       . letrozole [facility]) 2.5 mg tablet Take 2.5 mg by mouth once daily    . levothyroxine (SYNTHROID) 112 MCG tablet Take on an empty stomach with a glass of water at least 30-60 minutes before breakfast. Take 1 tablet by mouth daily 90 tablet 3  . losartan (COZAAR) 50 MG tablet Take 50 mg by mouth once daily       . omeprazole (PRILOSEC) 20 MG DR capsule Take 20 mg by mouth once daily as needed      . rosuvastatin (CRESTOR) 5 MG tablet Take 5 mg by mouth once daily       .  vitamin E 400 UNIT capsule Take 400 Units by mouth once daily. Reported on 01/19/2016     . amLODIPine (NORVASC) 5 MG tablet Take 1 tablet (5 mg total) by mouth once daily 30 tablet 11   No current facility-administered medications for this visit.       Allergies:       Allergies  Allergen Reactions  . Codeine Sulfate Shortness Of Breath, Nausea and Rash  . Darvocet A500 [Propoxyphene N-Acetaminophen] Shortness Of Breath, Rash and Vomiting  . Percocet [Oxycodone-Acetaminophen] Shortness Of Breath, Rash and Vomiting  . Albuterol Other (See Comments)    Passed out, increased bp  . Sulfamethoxazole-Trimethoprim Hives and Nausea  .  Lisinopril-Hydrochlorothiazide Nausea and Swelling  . Augmentin [Amoxicillin-Pot Clavulanate] Nausea  . Levaquin [Levofloxacin] Nausea  . Morphine Nausea And Vomiting  . Phenergan [Promethazine] Vomiting    Social History:  Social History  Social History        Socioeconomic History  . Marital status: Married    Spouse name: Edd Arbour  . Number of children: 2  . Years of education: 29  . Highest education level: Not on file  Occupational History  . Occupation: Retired- Psychiatric nurse  . Financial resource strain: Not on file  . Food insecurity    Worry: Not on file    Inability: Not on file  . Transportation needs    Medical: Not on file    Non-medical: Not on file  Tobacco Use  . Smoking status: Never Smoker  . Smokeless tobacco: Never Used  Substance and Sexual Activity  . Alcohol use: No  . Drug use: No  . Sexual activity: Defer    Partners: Male  Lifestyle  . Physical activity    Days per week: Not on file    Minutes per session: Not on file  . Stress: Not on file  Relationships  . Social Herbalist on phone: Not on file    Gets together: Not on file    Attends religious service: Not on file    Active member of club or organization: Not on file    Attends meetings of clubs or organizations: Not on file    Relationship status: Not on file  Other Topics Concern  . Not on file  Social History Narrative  . Not on file      Family History:       Family History  Problem Relation Age of Onset  . Heart disease Mother   . Ovarian cancer Mother   . Stroke Mother   . Cancer Mother   . Thyroid disease Mother   . Pemphigus vulgaris Father     Review of Systems:   A 10+ ROS was performed, reviewed, and the pertinent orthopaedic findings are documented in the HPI.    Physical Examination:   BP 124/84 (BP Location: Left upper arm, Patient Position: Sitting, BP Cuff Size: Adult)   Ht 165.1 cm (5'  5")   Wt 78.8 kg (173 lb 12.8 oz)   BMI 28.92 kg/m   Patient is a well-developed, well-nourished female in no acute distress. Patient has normal mood and affect. Patient is alert and oriented to person, place, and time.   HEENT: Atraumatic, normocephalic.  Pupils equal and reactive to light.  Extraocular motion intact.  Noninjected sclera.  Cardiovascular: Regular rate and rhythm, with no murmurs, rubs, or gallops.  Distal pulses palpable.  Respiratory: Lungs clear to auscultation bilaterally.   LeftKnee: Soft tissue swelling:mild  Effusion:minimal Erythema:none Crepitance:mild Tenderness:lateral, anterior Alignment:relative valgus Mediolateral laxity:lateral pseudolaxity Posterior BD:[id number] Patellar tracking:Good tracking without evidence of subluxation or tilt Atrophy:No significantatrophy.  Quadriceps tone was fair to good. Range of motion:0/15/100degrees  Sensation intact over the saphenous, lateral sural cutaneous, superficial fibular, and deep fibular nerve distributions.  Tests Performed/Reviewed:  X-rays  Anteroposterior, lateral, and sunrise views of the left knee were obtained.  Images reveal complete loss of lateral compartment joint space with bone-on-bone contact, subchondral sclerosis of the bone, and significant osteophyte formation.  Medial compartment remains relatively well-preserved with mild osteophyte formation noted.  Associated valgus alignment of the knee noted.  Lateral view reveals significant posterior osteophyte formation.  Images also reveal loss of patellofemoral joint space with significant osteophyte formation.  No acute fractures or dislocations noted.   Impression:     ICD-10-CM  1. Primary osteoarthritis of left knee  M17.12    Plan:   The patient has end-stage degenerative changes of the left knee.  It was explained to the patient that the condition is progressive in nature.  Having failed conservative treatment, the patient has elected to proceed with a total joint arthroplasty.  The patient will undergo a total joint arthroplasty with Dr. Marry Guan.  The risks of surgery, including blood clot and infection, were discussed with the patient.  Measures to reduce these risks, including the use of anticoagulation, perioperative antibiotics, and early ambulation were discussed.  The importance of postoperative physical therapy was discussed with the patient. The patient elects to proceed with surgery. The patient is instructed to stop all blood thinners prior to surgery.  The patient is instructed to call the hospital the day before surgery to learn of the proper arrival time.    Contact our office with any questions or concerns.  Follow up as indicated, or sooner should any new problems arise, if conditions worsen, or if they are otherwise concerned.   [name], PA [place] [facility] [place], [place] [address] Phone: [phone]  This note was generated in part with voice recognition software and I apologize for any typographical errors that were not detected and corrected.     Electronically signed by [name], [place] on 11/28/2019 4:11 PM

## 2019-12-01 NOTE — Anesthesia Post-op Follow-up Note (Signed)
Anesthesia QCDR form completed.        

## 2019-12-01 NOTE — Op Note (Signed)
OPERATIVE NOTE  DATE OF SURGERY:  12/01/2019  PATIENT NAME:  Margaret Townsend   DOB: 02/05/1946  MRN: [id number]  PRE-OPERATIVE DIAGNOSIS: Degenerative arthrosis of the left knee, primary  POST-OPERATIVE DIAGNOSIS:  Same  PROCEDURE:  Left total knee arthroplasty using computer-assisted navigation  SURGEON:  Marciano Sequin. M.D.  ASSISTANT: [name], PA-C (present and scrubbed throughout the case, critical for assistance with exposure, retraction, instrumentation, and closure)  ANESTHESIA: spinal  ESTIMATED BLOOD LOSS: 50 mL  FLUIDS REPLACED: 1000 mL of crystalloid  TOURNIQUET TIME: 98 minutes  DRAINS: 2 medium Hemovac drains  SOFT TISSUE RELEASES: Anterior cruciate ligament, posterior cruciate ligament, deep medial collateral ligament, patellofemoral ligament  IMPLANTS UTILIZED: DePuy Attune size 4 posterior stabilized femoral component (cemented), size 4 rotating platform tibial component (cemented), 35 mm medialized dome patella (cemented), and a 10 mm stabilized rotating platform polyethylene insert.  INDICATIONS FOR SURGERY: Margaret Townsend is a 73 y.o. year old female with a long history of progressive knee pain. X-rays demonstrated severe degenerative changes in tricompartmental fashion. The patient had not seen any significant improvement despite conservative nonsurgical intervention. After discussion of the risks and benefits of surgical intervention, the patient expressed understanding of the risks benefits and agree with plans for total knee arthroplasty.   The risks, benefits, and alternatives were discussed at length including but not limited to the risks of infection, bleeding, nerve injury, stiffness, blood clots, the need for revision surgery, cardiopulmonary complications, among others, and they were willing to proceed.  PROCEDURE IN DETAIL: The patient was brought into the operating room and, after adequate spinal anesthesia was achieved, a tourniquet was placed  on the patient's upper thigh. The patient's knee and leg were cleaned and prepped with alcohol and DuraPrep and draped in the usual sterile fashion. A "timeout" was performed as per usual protocol. The lower extremity was exsanguinated using an Esmarch, and the tourniquet was inflated to 300 mmHg. An anterior longitudinal incision was made followed by a standard mid vastus approach. The deep fibers of the medial collateral ligament were elevated in a subperiosteal fashion off of the medial flare of the tibia so as to maintain a continuous soft tissue sleeve. The patella was subluxed laterally and the patellofemoral ligament was incised. Inspection of the knee demonstrated severe degenerative changes with full-thickness loss of articular cartilage. Osteophytes were debrided using a rongeur. Anterior and posterior cruciate ligaments were excised. Two 4.0 mm Schanz pins were inserted in the femur and into the tibia for attachment of the array of trackers used for computer-assisted navigation. Hip center was identified using a circumduction technique. Distal landmarks were mapped using the computer. The distal femur and proximal tibia were mapped using the computer. The distal femoral cutting guide was positioned using computer-assisted navigation so as to achieve a 5 distal valgus cut. The femur was sized and it was felt that a size 4 femoral component was appropriate. A size 4 femoral cutting guide was positioned and the anterior cut was performed and verified using the computer. This was followed by completion of the posterior and chamfer cuts. Femoral cutting guide for the central box was then positioned in the center box cut was performed.  Attention was then directed to the proximal tibia. Medial and lateral menisci were excised. The extramedullary tibial cutting guide was positioned using computer-assisted navigation so as to achieve a 0 varus-valgus alignment and 3 posterior slope. The cut was performed and  verified using the computer. The proximal tibia was  sized and it was felt that a size 4 tibial tray was appropriate. Tibial and femoral trials were inserted followed by insertion of a 10 mm polyethylene insert. This allowed for excellent mediolateral soft tissue balancing both in flexion and in full extension. Finally, the patella was cut and prepared so as to accommodate a 35 mm medialized dome patella. A patella trial was placed and the knee was placed through a range of motion with excellent patellar tracking appreciated. The femoral trial was removed after debridement of posterior osteophytes. The central post-hole for the tibial component was reamed followed by insertion of a keel punch. Tibial trials were then removed. Cut surfaces of bone were irrigated with copious amounts of normal saline with antibiotic solution using pulsatile lavage and then suctioned dry. Polymethylmethacrylate cement with gentamicin was prepared in the usual fashion using a vacuum mixer. Cement was applied to the cut surface of the proximal tibia as well as along the undersurface of a size 4 rotating platform tibial component. Tibial component was positioned and impacted into place. Excess cement was removed using [employer] fast streamer. Cement was then applied to the cut surfaces of the femur as well as along the posterior flanges of the size 4 femoral component. The femoral component was positioned and impacted into place. Excess cement was removed using [employer] fast streamer. A 10 mm polyethylene trial was inserted and the knee was brought into full extension with steady axial compression applied. Finally, cement was applied to the backside of a 35 mm medialized dome patella and the patellar component was positioned and patellar clamp applied. Excess cement was removed using [employer] fast streamer. After adequate curing of the cement, the tourniquet was deflated after a total tourniquet time of 98 minutes. Hemostasis was achieved using electrocautery.  The knee was irrigated with copious amounts of normal saline with antibiotic solution using pulsatile lavage and then suctioned dry. 20 mL of 1.3% Exparel and 60 mL of 0.25% Marcaine in 40 mL of normal saline was injected along the posterior capsule, medial and lateral gutters, and along the arthrotomy site. A 10 mm stabilized rotating platform polyethylene insert was inserted and the knee was placed through a range of motion with excellent mediolateral soft tissue balancing appreciated and excellent patellar tracking noted. 2 medium drains were placed in the wound bed and brought out through separate stab incisions. The medial parapatellar portion of the incision was reapproximated using interrupted sutures of #1 Vicryl. Subcutaneous tissue was approximated in layers using first #0 Vicryl followed #2-0 Vicryl. The skin was approximated with skin staples. A sterile dressing was applied.  The patient tolerated the procedure well and was transported to the recovery room in stable condition.    Dashanique Brownstein P. Holley Bouche., M.D.

## 2019-12-01 NOTE — H&P (Signed)
The patient has been re-examined, and the chart reviewed, and there have been no interval changes to the documented history and physical.    The risks, benefits, and alternatives have been discussed at length. The patient expressed understanding of the risks benefits and agreed with plans for surgical intervention.  Margaret Townsend, Jr. M.D.    

## 2019-12-01 NOTE — Discharge Instructions (Signed)
Instructions after Total Knee Replacement   James P. Hooten, Jr., M.D.     Dept. of Orthopaedics & Sports Medicine  [facility]  [address]  [place], [place]  [address]  Phone: [phone]   Fax: [phone]    DIET: Drink plenty of non-alcoholic fluids. Resume your normal diet. Include foods high in fiber.  ACTIVITY:  You may use crutches or a walker with weight-bearing as tolerated, unless instructed otherwise. You may be weaned off of the walker or crutches by your Physical Therapist.  Do NOT place pillows under the knee. Anything placed under the knee could limit your ability to straighten the knee.   Continue doing gentle exercises. Exercising will reduce the pain and swelling, increase motion, and prevent muscle weakness.   Please continue to use the TED compression stockings for 6 weeks. You may remove the stockings at night, but should reapply them in the morning. Do not drive or operate any equipment until instructed.  WOUND CARE:  Continue to use the PolarCare or ice packs periodically to reduce pain and swelling. You may bathe or shower after the staples are removed at the first office visit following surgery.  MEDICATIONS: You may resume your regular medications. Please take the pain medication as prescribed on the medication. Do not take pain medication on an empty stomach. You have been given a prescription for a blood thinner (Lovenox or Coumadin). Please take the medication as instructed. (NOTE: After completing a 2 week course of Lovenox, take one Enteric-coated aspirin once a day. This along with elevation will help reduce the possibility of phlebitis in your operated leg.) Do not drive or drink alcoholic beverages when taking pain medications.  CALL THE OFFICE FOR: Temperature above 101 degrees Excessive bleeding or drainage on the dressing. Excessive swelling, coldness, or paleness of the toes. Persistent nausea and vomiting.  FOLLOW-UP:  You  should have an appointment to return to the office in 10-14 days after surgery. Arrangements have been made for continuation of Physical Therapy (either home therapy or outpatient therapy).   [facility] Department Directory         [email or web address]       [email or web address]          Cardiology  Appointments: [place] - [phone] [place] - [phone]  Endocrinology  Appointments: [place] - [phone] [place] - [phone]  Gastroenterology  Appointments: [place] - [phone] [place] - [phone]        General Surgery   Appointments: [place] - [phone]  Internal Medicine/Family Medicine  Appointments: [place] - [phone] [place] - [phone] [place] - [phone]  Metabolic and Weigh Loss Surgery  Appointments: [place] - [phone]        Neurology  Appointments: [place] - [phone] [place] - [phone]  Neurosurgery  Appointments: [place] - [phone]  Obstetrics & Gynecology  Appointments: [place] - [phone] [place] - [phone]        Pediatrics  Appointments: [place] - [phone] [place] - [phone]  Physiatry  Appointments: [place] -[phone]  Physical Therapy  Appointments: [place] - [phone] [place] - [phone]        Podiatry  Appointments: [place] - [phone] [place] - [phone]  Pulmonology  Appointments: [place] - [phone]  Rheumatology  Appointments:  - [phone]         Location: [facility]  [address] , [place]  [address]  [place] Location: [facility] [address] [place], [place]  [address]  [place] Location: [facility] [address] [place], [place]  [address]    

## 2019-12-01 NOTE — Transfer of Care (Signed)
Immediate Anesthesia Transfer of Care Note  Patient: Margaret Townsend  Procedure(s) Performed: COMPUTER ASSISTED TOTAL KNEE ARTHROPLASTY (Left Knee)  Patient Location: PACU  Anesthesia Type:Spinal  Level of Consciousness: awake, alert  and oriented  Airway & Oxygen Therapy: Patient Spontanous Breathing and Patient connected to face mask oxygen  Post-op Assessment: Report given to RN and Post -op Vital signs reviewed and stable  Post vital signs: Reviewed and stable  Last Vitals:  Vitals Value Taken Time  BP 129/73 12/01/19 1117  Temp 36.2 C 12/01/19 1116  Pulse 75 12/01/19 1119  Resp 15 12/01/19 1119  SpO2 100 % 12/01/19 1119  Vitals shown include unvalidated device data.  Last Pain:  Vitals:   12/01/19 0612  TempSrc: Tympanic  PainSc: 1          Complications: No apparent anesthesia complications

## 2019-12-01 NOTE — Evaluation (Signed)
Occupational Therapy Evaluation Patient Details Name: Margaret Townsend MRN: [id number] DOB: 05-07-1946 Today's Date: 12/01/2019    History of Present Illness Pt is a 73 year old F s/p L TKA.  PMH includes PONV, hypothyroidism, CA, breast CA and arthritis.   Clinical Impression   Mrs. Tumbarello was seen for OT evaluation this date, POD#0 from above surgery. Pt received in PACU with physical therapist just finishing up session. Pt spouse present at bedside and participated throughout OT evaluation/education. Pt was independent in all ADLs prior to surgery and denies falls history in the past 12 months. Pt is eager to return to PLOF with less pain and improved safety and independence. Pt currently requires minimal assist for LB dressing while in seated position due to pain and limited AROM of L knee. Pt instructed in polar care mgt, falls prevention strategies, home/routines modifications, DME/AE for LB bathing and dressing tasks, and compression stocking mgt. Pt would benefit from skilled OT services including additional instruction in dressing techniques with or without assistive devices for dressing and bathing skills to support recall and carryover prior to discharge and ultimately to maximize safety, independence, and minimize falls risk and caregiver burden. Upon hospital discharge recommend HHOT to maximize pt safety, satisfaction, and functional independence during meaningful occupations of daily life.    Follow Up Recommendations  Home health OT;Supervision - Intermittent    Equipment Recommendations  None recommended by OT;Other (comment)(Pt has necessary equipment.)    Recommendations for Other Services       Precautions / Restrictions Precautions Precautions: Fall Restrictions Weight Bearing Restrictions: Yes LLE Weight Bearing: Weight bearing as tolerated      Mobility Bed Mobility Overal bed mobility: Needs Assistance Bed Mobility: Supine to Sit     Supine to sit:  Supervision     General bed mobility comments: Deferred. Pt in recliner at start/end of session. Per physical therapist who worked with pt just prior to OT eval, pt required supervision for safety for sup>sit this date.  Transfers Overall transfer level: Needs assistance Equipment used: Rolling walker (2 wheeled) Transfers: Sit to/from Stand Sit to Stand: Supervision         General transfer comment: Slow to stand with education for use of RW.    Balance Overall balance assessment: Needs assistance Sitting-balance support: Feet supported Sitting balance-Leahy Scale: Normal     Standing balance support: Bilateral upper extremity supported Standing balance-Leahy Scale: Fair Standing balance comment: New reliance on RW, able to stand briefly without UE assist.                           ADL either performed or assessed with clinical judgement   ADL Overall ADL's : Needs assistance/impaired                                       General ADL Comments: Pt requires min A for LB dressing and bathing in a seated position due to increased pain and decreased ROM in her LLE. Mod to max A to don compression stockings. Pt caregiver present t/o OT evaluation and educated in compression stocking/polar care management. Pt would benefit form opportunity to trail AE for LB ADL tasks.     Vision Baseline Vision/History: No visual deficits Patient Visual Report: No change from baseline       Perception     Praxis  Pertinent Vitals/Pain Pain Assessment: 0-10 Pain Score: 2  Pain Location: L Knee after mobility Pain Descriptors / Indicators: Aching;Sore Pain Intervention(s): Limited activity within patient's tolerance;Premedicated before session;Monitored during session     Hand Dominance     Extremity/Trunk Assessment Upper Extremity Assessment Upper Extremity Assessment: Overall WFL for tasks assessed(BUE grossly 4/5 t/o)   Lower Extremity  Assessment Lower Extremity Assessment: Defer to PT evaluation;LLE deficits/detail LLE Deficits / Details: s/p LKA LLE: Unable to fully assess due to pain LLE Sensation: WNL LLE Coordination: WNL   Cervical / Trunk Assessment Cervical / Trunk Assessment: Normal   Communication Communication Communication: No difficulties   Cognition Arousal/Alertness: Awake/alert Behavior During Therapy: WFL for tasks assessed/performed Overall Cognitive Status: Within Functional Limits for tasks assessed                                     General Comments  Pt with polar care in place and active at start/end of session.    Exercises Total Joint Exercises Ankle Circles/Pumps: 10 reps;AROM;Supine;Both Quad Sets: Left;Strengthening;10 reps;Supine Short Arc Quad: AAROM;Left;5 reps;Supine Heel Slides: AAROM;5 reps;Supine;Left Hip ABduction/ADduction: AAROM;Left;10 reps;Supine Straight Leg Raises: AAROM;Left;10 reps;Supine Goniometric ROM: L knee ext: 0-55 degrees Other Exercises Other Exercises: Pt and caregiver (husband) educated in falls prevention strategies, safe use of AE/DME for ADL management, compression stocking management, polar care management, and knee precautions. Handout provided to support recall and carryover of education provided.   Shoulder Instructions      Home Living Family/patient expects to be discharged to:: Private residence Living Arrangements: Alone Available Help at Discharge: Family;Available 24 hours/day Type of Home: House Home Access: Stairs to enter [employer] of Steps: 3 Entrance Stairs-Rails: Can reach both Home Layout: One level         Bathroom Toilet: Standard Bathroom Accessibility: Yes   Home Equipment: Walker - 2 wheels;Bedside commode          Prior Functioning/Environment Level of Independence: Independent        Comments: Hydrographic surveyor        OT Problem List: Decreased strength;Decreased  coordination;Pain;Decreased range of motion;Decreased activity tolerance;Decreased safety awareness;Decreased knowledge of use of DME or AE;Decreased knowledge of precautions;Impaired balance (sitting and/or standing)      OT Treatment/Interventions: Self-care/ADL training;Therapeutic exercise;Therapeutic activities;DME and/or AE instruction;Patient/family education;Balance training    OT Goals(Current goals can be found in the care plan section) Acute Rehab OT Goals Patient Stated Goal: To return home and regain independence. OT Goal Formulation: With patient Time For Goal Achievement: 12/15/19 Potential to Achieve Goals: Good  OT Frequency: Min 1X/week   Barriers to D/C:            Co-evaluation              AM-PAC OT "6 Clicks" Daily Activity     Outcome Measure Help from another person eating meals?: None Help from another person taking care of personal grooming?: None Help from another person toileting, which includes using toliet, bedpan, or urinal?: A Little Help from another person bathing (including washing, rinsing, drying)?: A Little Help from another person to put on and taking off regular upper body clothing?: A Little Help from another person to put on and taking off regular lower body clothing?: A Little 6 Click Score: 20   End of Session    Activity Tolerance: Patient tolerated treatment well Patient left: in chair;with call bell/phone within reach;with  family/visitor present(With polar care in place.)  OT Visit Diagnosis: Other abnormalities of gait and mobility (R26.89);Pain Pain - Right/Left: Left Pain - part of body: Knee                Time: [id number] OT Time Calculation (min): 26 min Charges:  OT General Charges $OT Visit: 1 Visit OT Evaluation $OT Eval Low Complexity: 1 Low $OT Eval Moderate Complexity: 1 Mod OT Treatments $Self Care/Home Management : 8-22 mins  Shara Blazing, M.S., OTR/L Ascom: [phone] 12/01/19, 2:29 PM

## 2019-12-01 NOTE — Anesthesia Preprocedure Evaluation (Signed)
Anesthesia Evaluation  Patient identified by MRN, date of birth, ID band Patient awake    Reviewed: Allergy & Precautions, H&P , NPO status , Patient's Chart, lab work & pertinent test results, reviewed documented beta blocker date and time   History of Anesthesia Complications (+) PONV and history of anesthetic complications  Airway Mallampati: II  TM Distance: >3 FB Neck ROM: full    Dental  (+) Dental Advidsory Given, Caps, Teeth Intact   Pulmonary neg pulmonary ROS,    Pulmonary exam normal        Cardiovascular Exercise Tolerance: Good hypertension, (-) angina(-) Past MI and (-) Cardiac Stents Normal cardiovascular exam(-) dysrhythmias (-) Valvular Problems/Murmurs     Neuro/Psych negative neurological ROS  negative psych ROS   GI/Hepatic Neg liver ROS, GERD  ,  Endo/Other  neg diabetesHypothyroidism   Renal/GU negative Renal ROS  negative genitourinary   Musculoskeletal   Abdominal   Peds  Hematology negative hematology ROS (+)   Anesthesia Other Findings Past Medical History: No date: Arthritis No date: Breast cancer ([facility]CC) No date: Cancer ([place])     Comment:  breast No date: GERD (gastroesophageal reflux disease) No date: Headache No date: Hypertension No date: Hypothyroidism No date: PONV (postoperative nausea and vomiting) No date: Thyroid disease   Reproductive/Obstetrics negative OB ROS                             Anesthesia Physical Anesthesia Plan  ASA: II  Anesthesia Plan: Spinal   Post-op Pain Management:    Induction:   PONV Risk Score and Plan:   Airway Management Planned: Natural Airway and Simple Face Mask  Additional Equipment:   Intra-op Plan:   Post-operative Plan:   Informed Consent: I have reviewed the patients History and Physical, chart, labs and discussed the procedure including the risks, benefits and alternatives for the proposed  anesthesia with the patient or authorized representative who has indicated his/her understanding and acceptance.     Dental Advisory Given  Plan Discussed with: Anesthesiologist, CRNA and Surgeon  Anesthesia Plan Comments:         Anesthesia Quick Evaluation

## 2019-12-01 NOTE — Evaluation (Signed)
Physical Therapy Evaluation Patient Details Name: Margaret Townsend MRN: [id number] DOB: 1946-08-23 Today's Date: 12/01/2019   History of Present Illness  Pt is a 73 year old F s/p L TKA.  PMH includes PONV, hypothyroidism, CA, breast CA and arthritis.  Clinical Impression  Pt is a 73 year old F who lives in a single story home with her husband.  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  Pt A&O with mild delay in response intermittently throughout conversation.  She reported 2/10 pain in L knee and was able to perform bed mobility with mod I.  Pt able to sit at EOB with good balance and presented with good strength overall with the exception of postop pain limitations in the L knee.  Pt able to stand from bedside with use of RW and CGA, ambulate 60 ft with RW.  Pt educated on WBAT status and able to gradually wt shift to WB symmetrically during static stance and gait.  Pt gait improved with progress of distance.  She was able to complete stair training safely with CGA, husband will be present during stair entry at home.  Pt presented with limitations of knee flexion ROM in supine but bandage donned during measurement.  Pt will continue to benefit from skilled PT with focus on strength, pain management, knee ROM and safe functional mobility.    Follow Up Recommendations Home health PT;Supervision - Intermittent    Equipment Recommendations  None recommended by PT(Pt has a RW and BSC at home.)    Recommendations for Other Services       Precautions / Restrictions Precautions Precautions: Fall Restrictions Weight Bearing Restrictions: No      Mobility  Bed Mobility Overal bed mobility: Needs Assistance Bed Mobility: Supine to Sit     Supine to sit: Supervision     General bed mobility comments: Increased time with HOB elevated.  Transfers Overall transfer level: Needs assistance Equipment used: Rolling walker (2 wheeled) Transfers: Sit to/from Stand Sit to Stand:  Supervision         General transfer comment: Slow to stand with education for use of RW.  Ambulation/Gait Ambulation/Gait assistance: Min guard Gait Distance (Feet): 60 Feet Assistive device: Rolling walker (2 wheeled)     Gait velocity interpretation: <1.8 ft/sec, indicate of risk for recurrent falls General Gait Details: Antalgic gait, increased stance time on R LE, decreased on L LE, improved gait pattern with VC's for swing through and positioning of RW.  Stairs Stairs: Yes Stairs assistance: Min assist Stair Management: Two rails;Step to pattern Number of Stairs: 4 General stair comments: Able to complete with step to pattern, slow but with no LOB.  Pt able demonstrate understanding following education.  Wheelchair Mobility    Modified Rankin (Stroke Patients Only)       Balance Overall balance assessment: Needs assistance Sitting-balance support: Feet supported Sitting balance-Leahy Scale: Normal     Standing balance support: Bilateral upper extremity supported Standing balance-Leahy Scale: Fair Standing balance comment: New reliance on RW, able to stand briefly without UE assist.                             Pertinent Vitals/Pain Pain Assessment: 0-10 Pain Score: 2  Pain Location: posterior knee with stair training. 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        Extremity/Trunk  Assessment   Upper Extremity Assessment Upper Extremity Assessment: Overall WFL for tasks assessed(Grip, elbow flex/ext: 4-/5, No N/T.)    Lower Extremity Assessment Lower Extremity Assessment: Overall WFL for tasks assessed;LLE deficits/detail(R LE: Ankle DF/PF, knee flex/ext: 4/5) LLE: Unable to fully assess due to pain LLE Sensation: WNL LLE Coordination: WNL    Cervical / Trunk Assessment Cervical / Trunk Assessment: Normal  Communication   Communication: No difficulties  Cognition Arousal/Alertness: Awake/alert;Suspect due to medications(Some delay in response but able to follow all directions.) Behavior During Therapy: [facility] for tasks assessed/performed Overall Cognitive Status: Within Functional Limits for tasks assessed                                        General Comments      Exercises Total Joint Exercises Ankle Circles/Pumps: 10 reps;AROM;Supine;Both Quad Sets: Left;Strengthening;10 reps;Supine Short Arc Quad: AAROM;Left;5 reps;Supine Heel Slides: AAROM;5 reps;Supine;Left Hip ABduction/ADduction: AAROM;Left;10 reps;Supine Straight Leg Raises: AAROM;Left;10 reps;Supine Goniometric ROM: L knee ext: 0-55 degrees Other Exercises Other Exercises: Education: HEP, handouts, regarding Polar Care, car transfers x12 min.   Assessment/Plan    PT Assessment Patient needs continued PT services  PT Problem List Decreased strength;Decreased mobility;Decreased activity tolerance;Decreased balance;Decreased knowledge of use of DME;Pain;Decreased range of motion       PT Treatment Interventions DME instruction;Therapeutic activities;Gait training;Therapeutic exercise;Patient/family education;Stair training;Balance training;Functional mobility training    PT Goals (Current goals can be found in the Care Plan section)  Acute Rehab PT Goals Patient Stated Goal: To return home and regain independence. PT Goal Formulation: With patient Time For Goal Achievement:  12/01/19 Potential to Achieve Goals: Good    Frequency BID   Barriers to discharge        Co-evaluation               AM-PAC PT "6 Clicks" Mobility  Outcome Measure Help needed turning from your back to your side while in a flat bed without using bedrails?: None Help needed moving from lying on your back to sitting on the side of a flat bed without using bedrails?: A Little Help needed moving to and from a bed to a chair (including a wheelchair)?: A Little Help needed standing up from a chair using your arms (e.g., wheelchair or bedside chair)?: A Little Help needed to walk in hospital room?: A Little Help needed climbing 3-5 steps with a railing? : A Little 6 Click Score: 19    End of Session Equipment Utilized During Treatment: Gait belt Activity Tolerance: Patient tolerated treatment well Patient left: in chair;with nursing/sitter in room Nurse Communication: Mobility status PT Visit Diagnosis: Unsteadiness on feet (R26.81);Muscle weakness (generalized) (M62.81);Pain Pain - Right/Left: Left Pain - part of body: Knee    Time: [id number] PT Time Calculation (min) (ACUTE ONLY): 45 min   Charges:   PT Evaluation $PT Eval Low Complexity: 1 Low PT Treatments $Therapeutic Exercise: 8-22 mins       Roxanne Gates, PT, DPT   Judson Roch  Alizey Noren 12/01/2019, 2:11 PM

## 2019-12-01 NOTE — Discharge Summary (Signed)
Physician Discharge Summary  Patient ID: Margaret Townsend MRN: [id number] DOB/AGE: [birth year] 73 y.o.  Admit date: 12/01/2019 Discharge date: 12/01/2019  Admission Diagnoses:  Osteoarthritis of left knee [M17.12]  Surgeries:Procedure(s): COMPUTER ASSISTED TOTAL KNEE ARTHROPLASTY on 12/01/2019  Discharge Diagnoses: Patient Active Problem List   Diagnosis Date Noted  . Osteoarthritis of left knee 12/01/2019  . Primary osteoarthritis of left knee 09/26/2019  . Peripheral polyneuropathy 08/01/2019  . Chronic pain of left knee 08/01/2019  . Heart palpitations 08/17/2018  . History of vitamin D deficiency 11/07/2017  . Prediabetes 11/07/2017  . Pure hypercholesterolemia 11/06/2017  . Degenerative disc disease, cervical 09/21/2016  . Allergic rhinitis, seasonal 11/13/2015  . Avitaminosis D 11/13/2015  . Essential (primary) hypertension 08/28/2015  . Gastro-esophageal reflux disease without esophagitis 08/28/2015  . Combined fat and carbohydrate induced hyperlipemia 08/28/2015  . Malignant neoplasm of breast ([place]) 07/13/2014  . HLD (hyperlipidemia) 05/09/2014  . Inflamed nasal mucosa 05/09/2014  . Acid reflux 04/06/2014  . BP (high blood pressure) 04/06/2014  . Adult hypothyroidism 04/06/2014  . Degenerative arthritis of lumbar spine 04/06/2014    Past Medical History:  Diagnosis Date  . Arthritis   . Breast cancer ([place])   . Cancer (HCC)    breast  . GERD (gastroesophageal reflux disease)   . Headache   . Hypertension   . Hypothyroidism   . PONV (postoperative nausea and vomiting)   . Thyroid disease      Transfusion: None   Consultants (if any): None  Discharged Condition: Improved  Hospital Course: Margaret Townsend is an 73 y.o. female who was admitted 12/01/2019 with a diagnosis of degenerative or arthrosis of the left knee and underwent left total knee arthroplasty using computer-assisted navigation. The patient received perioperative antibiotics for prophylaxis  (see below). The patient tolerated the procedure well and was transported to PACU in stable condition.  The patient was then evaluated in the PACU by Physical therapy and Occupational Therapy.  She demonstrated safety with transfers and ambulation.  Prior arrangements had been made for home physical therapy and appropriate DME/equipment.  The patient met criteria for discharge to home.  The patient achieved his preliminary goals of this hospitalization and was felt to be medically and orthopaedically appropriate for discharge.  She was given perioperative antibiotics:  Anti-infectives (From admission, onward)   Start     Dose/Rate Route Frequency Ordered Stop   12/01/19 1145  clindamycin (CLEOCIN) IVPB 600 mg     600 mg 100 mL/hr over 30 Minutes Intravenous Every 6 hours 12/01/19 1132 12/02/19 1144   12/01/19 0620  clindamycin (CLEOCIN) 900 MG/50ML IVPB    Note to Pharmacy: Myles Lipps   : cabinet override      12/01/19 0620 12/01/19 0741   12/01/19 0600  clindamycin (CLEOCIN) IVPB 900 mg     900 mg 100 mL/hr over 30 Minutes Intravenous On call to O.R. 12/01/19 0041 12/01/19 0736    .  Recent vital signs:  Vitals:   12/01/19 1340 12/01/19 1450  BP: 120/63 118/63  Pulse: 87 85  Resp: 16 16  Temp: 98.5 F (36.9 C)   SpO2: 96% 96%    Recent laboratory studies:  Recent Labs    11/29/19 0934 12/01/19 0655  WBC 6.0  --   HGB 13.4  --   HCT 40.8  --   PLT 221  --   K  --  3.9  CL  --  103  CO2  --  27  BUN  --  16  CREATININE  --  0.91  GLUCOSE  --  132*  CALCIUM  --  9.0  INR 1.0  --     Diagnostic Studies: DG Knee Left Port  Result Date: 12/01/2019 CLINICAL DATA:  Post left total knee replacement EXAM: PORTABLE LEFT KNEE - 1-2 VIEW COMPARISON:  None. FINDINGS: Changes of left knee replacement. Joint space gas present. No hardware or bony complicating feature. IMPRESSION: Left knee replacement.  No hardware or bony complicating feature. Electronically Signed   By:  Rolm Baptise M.D.   On: 12/01/2019 11:53    Discharge Medications:     Disposition: Discharge disposition: 01-Home or Self Care       Discharge Instructions    Call MD / Call 911   Complete by: As directed    If you experience chest pain or shortness of breath, CALL 911 and be transported to the hospital emergency room.  If you develope a fever above 101 F, pus (white drainage) or increased drainage or redness at the wound, or calf pain, call your surgeon's office.   Constipation Prevention   Complete by: As directed    Drink plenty of fluids.  Prune juice may be helpful.  You may use a stool softener, such as Colace (over the counter) 100 mg twice a day.  Use MiraLax (over the counter) for constipation as needed.   DO NOT drive, shower or take a tub bath until instructed by your physician   Complete by: As directed    Diet general   Complete by: As directed    Do not put a pillow under the knee. Place it under the heel.   Complete by: As directed    Driving restrictions   Complete by: As directed    No driving for 6 weeks   Increase activity slowly as tolerated   Complete by: As directed    TED hose   Complete by: As directed    Use stockings (TED hose) for 6 weeks on both leg(s).  You may remove them at night for sleeping.   Weight bearing as tolerated   Complete by: As directed    Laterality: bilateral      Follow-up Information    Fausto Skillern, PA-C On 12/16/2019.   Specialty: Orthopedic Surgery Why: at 1:15pm Contact information: [place] [address] [phone]        Dereck Leep, MD On 01/13/2020.   Specialty: Orthopedic Surgery Why: at 11:15am Contact information: [place] [address] [phone]            Laurice Record. Holley Bouche., M.D.

## 2019-12-02 DIAGNOSIS — Z79891 Long term (current) use of opiate analgesic: Secondary | ICD-10-CM | POA: Diagnosis not present

## 2019-12-02 DIAGNOSIS — E785 Hyperlipidemia, unspecified: Secondary | ICD-10-CM | POA: Diagnosis not present

## 2019-12-02 DIAGNOSIS — Z471 Aftercare following joint replacement surgery: Secondary | ICD-10-CM | POA: Diagnosis not present

## 2019-12-02 DIAGNOSIS — Z9011 Acquired absence of right breast and nipple: Secondary | ICD-10-CM | POA: Diagnosis not present

## 2019-12-02 DIAGNOSIS — K219 Gastro-esophageal reflux disease without esophagitis: Secondary | ICD-10-CM | POA: Diagnosis not present

## 2019-12-02 DIAGNOSIS — E079 Disorder of thyroid, unspecified: Secondary | ICD-10-CM | POA: Diagnosis not present

## 2019-12-02 DIAGNOSIS — Z96652 Presence of left artificial knee joint: Secondary | ICD-10-CM | POA: Diagnosis not present

## 2019-12-02 DIAGNOSIS — I10 Essential (primary) hypertension: Secondary | ICD-10-CM | POA: Diagnosis not present

## 2019-12-02 DIAGNOSIS — Z853 Personal history of malignant neoplasm of breast: Secondary | ICD-10-CM | POA: Diagnosis not present

## 2019-12-02 NOTE — Anesthesia Postprocedure Evaluation (Signed)
Anesthesia Post Note  Patient: Margaret Townsend  Procedure(s) Performed: COMPUTER ASSISTED TOTAL KNEE ARTHROPLASTY (Left Knee)  Patient location during evaluation: Other Anesthesia Type: Spinal Comments: Pt d/c to home same day     Last Vitals:  Vitals:   12/01/19 1340 12/01/19 1450  BP: 120/63 118/63  Pulse: 87 85  Resp: 16 16  Temp: 36.9 C   SpO2: 96% 96%    Last Pain:  Vitals:   12/01/19 1450  TempSrc:   PainSc: 1                  Brantley Fling

## 2019-12-04 ENCOUNTER — Emergency Department: Payer: Medicare HMO

## 2019-12-04 ENCOUNTER — Emergency Department
Admission: EM | Admit: 2019-12-04 | Discharge: 2019-12-04 | Disposition: A | Discharge status: Home / Self Care | Payer: Medicare HMO | Attending: Emergency Medicine | Admitting: Emergency Medicine

## 2019-12-04 DIAGNOSIS — R42 Dizziness and giddiness: Secondary | ICD-10-CM

## 2019-12-04 DIAGNOSIS — R2242 Localized swelling, mass and lump, left lower limb: Secondary | ICD-10-CM | POA: Diagnosis not present

## 2019-12-04 DIAGNOSIS — Z96652 Presence of left artificial knee joint: Secondary | ICD-10-CM | POA: Diagnosis not present

## 2019-12-04 DIAGNOSIS — Z79899 Other long term (current) drug therapy: Secondary | ICD-10-CM | POA: Diagnosis not present

## 2019-12-04 DIAGNOSIS — Z20822 Contact with and (suspected) exposure to covid-19: Secondary | ICD-10-CM | POA: Diagnosis not present

## 2019-12-04 DIAGNOSIS — R55 Syncope and collapse: Secondary | ICD-10-CM | POA: Diagnosis not present

## 2019-12-04 DIAGNOSIS — R509 Fever, unspecified: Secondary | ICD-10-CM | POA: Diagnosis not present

## 2019-12-04 DIAGNOSIS — R4020 Unspecified coma: Secondary | ICD-10-CM | POA: Diagnosis not present

## 2019-12-04 DIAGNOSIS — Z03818 Encounter for observation for suspected exposure to other biological agents ruled out: Secondary | ICD-10-CM | POA: Diagnosis not present

## 2019-12-04 DIAGNOSIS — I951 Orthostatic hypotension: Secondary | ICD-10-CM | POA: Diagnosis not present

## 2019-12-04 DIAGNOSIS — I10 Essential (primary) hypertension: Secondary | ICD-10-CM | POA: Insufficient documentation

## 2019-12-04 DIAGNOSIS — E039 Hypothyroidism, unspecified: Secondary | ICD-10-CM | POA: Diagnosis not present

## 2019-12-04 DIAGNOSIS — M25562 Pain in left knee: Secondary | ICD-10-CM | POA: Insufficient documentation

## 2019-12-04 DIAGNOSIS — I959 Hypotension, unspecified: Secondary | ICD-10-CM | POA: Diagnosis not present

## 2019-12-04 DIAGNOSIS — M7989 Other specified soft tissue disorders: Secondary | ICD-10-CM | POA: Diagnosis not present

## 2019-12-04 DIAGNOSIS — R69 Illness, unspecified: Secondary | ICD-10-CM | POA: Diagnosis not present

## 2019-12-04 LAB — URINALYSIS, COMPLETE (UACMP) WITH MICROSCOPIC
Bacteria, UA: NONE SEEN
Bilirubin Urine: NEGATIVE
Glucose, UA: NEGATIVE mg/dL
Hgb urine dipstick: NEGATIVE
Ketones, ur: 5 mg/dL — AB
Leukocytes,Ua: NEGATIVE
Nitrite: NEGATIVE
Protein, ur: NEGATIVE mg/dL
Specific Gravity, Urine: 1.019 (ref 1.005–1.030)
pH: 6 (ref 5.0–8.0)

## 2019-12-04 LAB — BASIC METABOLIC PANEL
Anion gap: 12 (ref 5–15)
BUN: 13 mg/dL (ref 8–23)
CO2: 24 mmol/L (ref 22–32)
Calcium: 8.3 mg/dL — ABNORMAL LOW (ref 8.9–10.3)
Chloride: 102 mmol/L (ref 98–111)
Creatinine, Ser: 0.8 mg/dL (ref 0.44–1.00)
GFR calc Af Amer: >60 mL/min (ref >60)
GFR calc non Af Amer: >60 mL/min (ref >60)
Glucose, Bld: 125 mg/dL — ABNORMAL HIGH (ref 70–99)
Potassium: 3.5 mmol/L (ref 3.5–5.1)
Sodium: 138 mmol/L (ref 135–145)

## 2019-12-04 LAB — POC SARS CORONAVIRUS 2 AG: SARS Coronavirus 2 Ag: NEGATIVE

## 2019-12-04 LAB — CBC
HCT: 33 % — ABNORMAL LOW (ref 36.0–46.0)
Hemoglobin: 10.8 g/dL — ABNORMAL LOW (ref 12.0–15.0)
MCH: 29.8 pg (ref 26.0–34.0)
MCHC: 32.7 g/dL (ref 30.0–36.0)
MCV: 90.9 fL (ref 80.0–100.0)
Platelets: 191 10*3/uL (ref 150–400)
RBC: 3.63 MIL/uL — ABNORMAL LOW (ref 3.87–5.11)
RDW: 13.9 % (ref 11.5–15.5)
WBC: 9.6 10*3/uL (ref 4.0–10.5)
nRBC: 0 % (ref 0.0–0.2)

## 2019-12-04 MED ORDER — SODIUM CHLORIDE 0.9 % IV BOLUS
500.0000 mL | Freq: Once | INTRAVENOUS | Status: AC
  Administered 2019-12-04: 13:00:00 500 mL via INTRAVENOUS

## 2019-12-04 MED ORDER — SODIUM CHLORIDE 0.9 % IV BOLUS: 500.0000 mL | Freq: Once | INTRAVENOUS | Status: DC

## 2019-12-04 NOTE — Discharge Instructions (Addendum)
Your lab test today were all okay.  Please follow-up with your doctor for continued monitoring of your symptoms.  Be sure to eat and drink regularly to stay hydrated.

## 2019-12-04 NOTE — ED Provider Notes (Signed)
[facility] Emergency Department Provider Note   ____________________________________________   First MD Initiated Contact with Patient 12/04/19 1049     (approximate)  I have reviewed the triage vital signs and the nursing notes.   HISTORY  Chief Complaint Loss of Consciousness    HPI Margaret Townsend is a 74 y.o. female history of a recent left knee replacement.  Just discharged from the hospital, since that time she is gone home and she has felt "lightheaded" with standing since.  Last night she felt like she might have a slight low-grade fever.   She reports that she got up this morning, she was getting ready to stand when she became lightheaded once again, fell back onto the bed and passed out briefly.  Denies any injury.  Reports he has been experiencing lightheadedness frequently with standing since leaving the hospital.  She has been working to stay well-hydrated.  Her left knee has been sore, but she seems to be recovering overall well.  Past Medical History:  Diagnosis Date  . Arthritis   . Breast cancer ([place])   . Cancer (HCC)    breast  . GERD (gastroesophageal reflux disease)   . Headache   . Hypertension   . Hypothyroidism   . PONV (postoperative nausea and vomiting)   . Thyroid disease     Patient Active Problem List   Diagnosis Date Noted  . Osteoarthritis of left knee 12/01/2019  . Primary osteoarthritis of left knee 09/26/2019  . Peripheral polyneuropathy 08/01/2019  . Chronic pain of left knee 08/01/2019  . Heart palpitations 08/17/2018  . History of vitamin D deficiency 11/07/2017  . Prediabetes 11/07/2017  . Pure hypercholesterolemia 11/06/2017  . Degenerative disc disease, cervical 09/21/2016  . Allergic rhinitis, seasonal 11/13/2015  . Avitaminosis D 11/13/2015  . Essential (primary) hypertension 08/28/2015  . Gastro-esophageal reflux disease without esophagitis 08/28/2015  . Combined fat and carbohydrate induced  hyperlipemia 08/28/2015  . Malignant neoplasm of breast ([place]) 07/13/2014  . HLD (hyperlipidemia) 05/09/2014  . Inflamed nasal mucosa 05/09/2014  . Acid reflux 04/06/2014  . BP (high blood pressure) 04/06/2014  . Adult hypothyroidism 04/06/2014  . Degenerative arthritis of lumbar spine 04/06/2014    Past Surgical History:  Procedure Laterality Date  . ABDOMINAL HYSTERECTOMY    . BACK SURGERY  2001   Lumbar  . BREAST SURGERY    . CATARACT EXTRACTION W/PHACO Left 05/06/2018   Procedure: CATARACT EXTRACTION PHACO AND INTRAOCULAR LENS PLACEMENT ([place]) LEFT;  Surgeon: Leandrew Koyanagi, MD;  Location: [place];  Service: Ophthalmology;  Laterality: Left;  prefers to arrive around 9 am  . CATARACT EXTRACTION W/PHACO Right 05/20/2018   Procedure: CATARACT EXTRACTION PHACO AND INTRAOCULAR LENS PLACEMENT ([place]) RIGHT  TORIC;  Surgeon: Leandrew Koyanagi, MD;  Location: [place];  Service: Ophthalmology;  Laterality: Right;  . CHOLECYSTECTOMY    . DILATION AND CURETTAGE OF UTERUS    . KNEE ARTHROPLASTY Left 12/01/2019   Procedure: COMPUTER ASSISTED TOTAL KNEE ARTHROPLASTY;  Surgeon: Dereck Leep, MD;  Location: [facility];  Service: Orthopedics;  Laterality: Left;  . KNEE SURGERY Left 1976  . MASTECTOMY Right 2015   breast cancer only treated surgically and with Letrozole  . TONSILLECTOMY      Prior to Admission medications   Medication Sig Start Date End Date Taking? Authorizing Provider  acetaminophen (TYLENOL) 500 MG tablet Take 250 mg by mouth every 6 (six) hours as needed (for pain).    [provider]  amLODipine (NORVASC) 5 MG tablet Take 1 tablet by mouth daily. 09/18/18   [provider]  celecoxib (CELEBREX) 200 MG capsule Take 1 capsule (200 mg total) by mouth 2 (two) times daily. 12/01/19 11/30/20  Hooten, Laurice Record, MD  Cholecalciferol (VITAMIN D3) 50 MCG (2000 UT) TABS Take 2,000 Units by mouth daily in the afternoon.    [provider]  diphenhydramine-acetaminophen (TYLENOL PM) 25-500 MG TABS tablet Take 0.5 tablets by mouth at bedtime as needed (pain.).     [provider]  enoxaparin (LOVENOX) 40 MG/0.4ML injection Inject 0.4 mLs (40 mg total) into the skin daily. 12/01/19   Hooten, Laurice Record, MD  famotidine (PEPCID) 20 MG tablet Take 1 tablet (20 mg total) by mouth 2 (two) times daily as needed for heartburn or indigestion. 07/29/19   Mikey College, NP  Nyoka Cowden Tea Oil Fragrance OIL Take 1 drop by mouth daily.     [provider]  letrozole (FEMARA) 2.5 MG tablet Take 2.5 mg by mouth daily before breakfast.     [provider]  levothyroxine (SYNTHROID) 112 MCG tablet Take 1 tablet (112 mcg total) by mouth daily before breakfast. 09/09/18   Mikey College, NP  losartan-hydrochlorothiazide (HYZAAR) 50-12.5 MG tablet Take 1 tablet by mouth daily. 07/29/19   Mikey College, NP  Multiple Vitamin (MULTIVITAMIN WITH MINERALS) TABS tablet Take 1 tablet by mouth daily in the afternoon.    [provider]  omeprazole (PRILOSEC) 20 MG capsule Take 20 mg by mouth at bedtime.    [provider]  oxyCODONE (ROXICODONE) 5 MG immediate release tablet Take 1-2 tablets (5-10 mg total) by mouth every 4 (four) hours as needed for severe pain. 12/01/19   Hooten, Laurice Record, MD  rosuvastatin (CRESTOR) 5 MG tablet Take 1 tablet (5 mg total) by mouth daily. Patient taking differently: Take 5 mg by mouth every other day. At night 02/01/19   Mikey College, NP  traMADol (ULTRAM) 50 MG tablet Take 1 tablet (50 mg total) by mouth every 6 (six) hours as needed for moderate pain. 12/01/19   Hooten, Laurice Record, MD  vitamin B-12 (CYANOCOBALAMIN) 1000 MCG tablet Take 1,000 mcg by mouth daily in the afternoon.     [provider]  vitamin E 400 UNIT capsule Take 1 capsule by mouth daily.    [provider]    Allergies Codeine, Oxycodone-acetaminophen, Propoxyphene,  Sulfamethoxazole-trimethoprim, Albuterol, Demerol [meperidine], Dilaudid [hydromorphone hcl], Iodinated diagnostic agents, Morphine and related, Other, Sulfa antibiotics, Lisinopril-hydrochlorothiazide, Amoxicillin-pot clavulanate, Hydrocodone-acetaminophen, Levofloxacin, Promethazine, and Tramadol  Family History  Problem Relation Age of Onset  . Stroke Mother 56  . Ovarian cancer Mother   . Pemphigus vulgaris Father   . Aneurysm Sister        Brain  . Hyperlipidemia Daughter   . Thyroid disease Daughter 27    Social History Social History   Tobacco Use  . Smoking status: Never Smoker  . Smokeless tobacco: Never Used  Substance Use Topics  . Alcohol use: Never  . Drug use: Never    Review of Systems Constitutional: Like she had slight fever last night Eyes: No visual changes. ENT: No sore throat.  No neck pain. Cardiovascular: Denies chest pain. Respiratory: Denies shortness of breath. Gastrointestinal: No abdominal pain.   Genitourinary: Negative for dysuria. Musculoskeletal: Negative for back pain. Skin: Negative for rash. Neurological: Negative for headaches, areas of focal weakness or numbness.  Only time she experiencing symptoms when  she stands up and she starts to feel very lightheaded with this occurs.    ____________________________________________   PHYSICAL EXAM:  VITAL SIGNS: ED Triage Vitals [12/04/19 1032]  Enc Vitals Group     BP (!) 147/79     Pulse Rate 87     Resp 14     Temp 99.7 F (37.6 C)     Temp Source Oral     SpO2 97 %     Weight 170 lb (77.1 kg)     Height 5\' 5"  (1.651 m)     Head Circumference      Peak Flow      Pain Score 0     Pain Loc      Pain Edu?      Excl. in [place]?     Constitutional: Alert and oriented. Well appearing and in no acute distress.  Resting comfortably in the hallway. Eyes: Conjunctivae are normal. Head: Atraumatic. Nose: No congestion/rhinnorhea. Mouth/Throat: Mucous membranes are slightly dry. Neck:  No stridor.  Cardiovascular: Normal rate, regular rhythm. Grossly normal heart sounds.  Good peripheral circulation. Respiratory: Normal respiratory effort.  No retractions. Lungs CTAB. Gastrointestinal: Soft and nontender. No distention. Musculoskeletal: No lower extremity tenderness except somewhat expected over her left knee incision.  There is mild warmth in swelling over the incision without evidence of cellulitis or purulence.  Strong dorsalis pedis pulses bilateral Neurologic:  Normal speech and language. No gross focal neurologic deficits are appreciated.  Skin:  Skin is warm, dry and intact. No rash noted. Psychiatric: Mood and affect are normal. Speech and behavior are normal.  ____________________________________________   LABS (all labs ordered are listed, but only abnormal results are displayed)  Labs Reviewed  CBC - Abnormal; Notable for the following components:      Result Value   RBC 3.63 (*)    Hemoglobin 10.8 (*)    HCT 33.0 (*)    All other components within normal limits  BASIC METABOLIC PANEL - Abnormal; Notable for the following components:   Glucose, Bld 125 (*)    Calcium 8.3 (*)    All other components within normal limits  URINALYSIS, COMPLETE (UACMP) WITH MICROSCOPIC  POC SARS CORONAVIRUS 2 AG -  ED  POC SARS CORONAVIRUS 2 AG   ____________________________________________  EKG  Reviewed enterotomy at 1630 Heart rate 99 QRS 109 QTc 450 Normal sinus rhythm no evidence of ischemia or ectopy ____________________________________________  RADIOLOGY  DG Chest 1 View  Result Date: 12/04/2019 CLINICAL DATA:  Syncope.  Fevers. EXAM: CHEST  1 VIEW COMPARISON:  None. FINDINGS: The heart size and mediastinal contours are within normal limits. Both lungs are clear. The visualized skeletal structures are unremarkable. IMPRESSION: No active disease. Electronically Signed   By: Kerby Moors M.D.   On: 12/04/2019 12:52   US Venous Img Lower Unilateral  Left  Result Date: 12/04/2019 CLINICAL DATA:  Postop left knee replacement, pain and swelling EXAM: LEFT LOWER EXTREMITY VENOUS DOPPLER ULTRASOUND TECHNIQUE: Gray-scale sonography with graded compression, as well as color Doppler and duplex ultrasound were performed to evaluate the lower extremity deep venous systems from the level of the common femoral vein and including the common femoral, femoral, profunda femoral, popliteal and calf veins including the posterior tibial, peroneal and gastrocnemius veins when visible. The superficial great saphenous vein was also interrogated. Spectral Doppler was utilized to evaluate flow at rest and with distal augmentation maneuvers in the common femoral, femoral and popliteal veins. COMPARISON:  None. FINDINGS: Contralateral  Common Femoral Vein: Respiratory phasicity is normal and symmetric with the symptomatic side. No evidence of thrombus. Normal compressibility. Common Femoral Vein: No evidence of thrombus. Normal compressibility, respiratory phasicity and response to augmentation. Saphenofemoral Junction: No evidence of thrombus. Normal compressibility and flow on color Doppler imaging. Profunda Femoral Vein: No evidence of thrombus. Normal compressibility and flow on color Doppler imaging. Femoral Vein: No evidence of thrombus. Normal compressibility, respiratory phasicity and response to augmentation. Popliteal Vein: No evidence of thrombus. Normal compressibility, respiratory phasicity and response to augmentation. Calf Veins: No evidence of thrombus. Normal compressibility and flow on color Doppler imaging. IMPRESSION: No evidence of significant left lower extremity deep venous thrombosis. Electronically Signed   By: Jerilynn Mages.  Shick M.D.   On: 12/04/2019 12:19    No indication for head CT imaging.  Patient denies any sort of heart fall, stood up became lightheaded and fell back and landed on the bed without injury.  There is no evidence of trauma.  She denies signs or  symptoms of head injury ____________________________________________   PROCEDURES  Procedure(s) performed: None  Procedures  Critical Care performed: No  ____________________________________________   INITIAL IMPRESSION / ASSESSMENT AND PLAN / ED COURSE  Pertinent labs & imaging results that were available during my care of the patient were reviewed by me and considered in my medical decision making (see chart for details).   Patient comes for evaluation of lightheadedness with standing she reports that this started right after her surgery and has been steadily present while at home for the last few days.  Today when she stood up she actually passed out.  Her daughter helps care for her, and she reports that she is not have any associated neurologic symptoms.  No chest pain no trouble breathing.  Overall her clinical exam is very reassuring, I suspect likely an element of orthostatic hypotension, and Dr. Marry Guan of orthopedics also evaluated the patient and found her surgical site to be reassuring.  We will hydrate her, check orthostatics consider additional IV fluid rehydration  ----------------------------------------- 4:17 PM on 12/04/2019 -----------------------------------------  Patient reports he is feeling a whole lot better after IV fluids she is eating a meal now.  She looks well, she has been able to stand but does appear to be orthostatic.  Will check urinalysis, she is receiving additional IV fluid, I discussed goals of care and for treatment.  She would strongly preference not to stay in the hospital for observation, and would like to be able to be discharged.  She in part notes concern around COVID-19 in the community, and I frankly am in agreement with her.  I think be very reasonable to hydrate her and discharge her with anticipated follow-up and she has careful precautions and can return if worsening symptoms or recurrence of syncope.  Her hemoglobin is slightly low, but  orthopedics also advised this is expected after her particular surgery.  She is in no acute distress at this time, receiving IV fluids.  Dr. Joni Fears taking over care and will follow up on her urinalysis and reassessment prior to any plan for possible discharge versus in observation admission the with shared medical decision making patient strongly preference and discharge which I think is reasonable      ____________________________________________   FINAL CLINICAL IMPRESSION(S) / ED DIAGNOSES  Final diagnoses:  Syncope and collapse  Orthostatic lightheadedness        Note:  This document was prepared using Dragon voice recognition software and may include unintentional dictation  errors       Delman Kitten, MD 12/04/19 [phone]

## 2019-12-04 NOTE — ED Triage Notes (Signed)
Patient to ED from home via [facility] c/o syncopal episode. Per EMS patient was sitting on side of bed and according to daughter she  "passed out and her eyes rolled to the back of her head for about a minute". Patient is A&Ox4 and reports being 3 days post op from a left knee surgery.

## 2019-12-04 NOTE — Consult Note (Signed)
ORTHOPAEDIC CONSULTATION  PATIENT NAME: Margaret Townsend DOB: [birth year]  MRN: [id number]  REQUESTING PHYSICIAN: Delman Kitten, MD  Chief Complaint: Postop  HPI: Margaret Townsend is a 74 y.o. female who underwent uneventful left total knee arthroplasty 3 days ago.  She was discharged to home after completing her physical therapy and Occupational Therapy.  She apparently had an episode this morning when she first sat up with loss of consciousness.  She denies any headaches or vision changes.  She states that she has been using incentive spirometry as instructed and is also been conscientious with pushing oral fluids.  She apparently had a low-grade temperature (99 F).  She denies any cough.  She has had some dizziness when she is out of bed.  She denies any symptoms when she is supine.  She has typically been using Tylenol for pain control.  She denies any significant left knee pain with the exception of her physical therapy sessions.  Past Medical History:  Diagnosis Date  . Arthritis   . Breast cancer ([place])   . Cancer ([facility])    breast  . GERD (gastroesophageal reflux disease)   . Headache   . Hypertension   . Hypothyroidism   . PONV (postoperative nausea and vomiting)   . Thyroid disease    Past Surgical History:  Procedure Laterality Date  . ABDOMINAL HYSTERECTOMY    . BACK SURGERY  2001   Lumbar  . BREAST SURGERY    . CATARACT EXTRACTION W/PHACO Left 05/06/2018   Procedure: CATARACT EXTRACTION PHACO AND INTRAOCULAR LENS PLACEMENT ([place]) LEFT;  Surgeon: Leandrew Koyanagi, MD;  Location: [facility];  Service: Ophthalmology;  Laterality: Left;  prefers to arrive around 9 am  . CATARACT EXTRACTION W/PHACO Right 05/20/2018   Procedure: CATARACT EXTRACTION PHACO AND INTRAOCULAR LENS PLACEMENT ([place]) RIGHT  TORIC;  Surgeon: Leandrew Koyanagi, MD;  Location: [facility];  Service: Ophthalmology;  Laterality: Right;  . CHOLECYSTECTOMY    . DILATION AND CURETTAGE OF UTERUS     . KNEE ARTHROPLASTY Left 12/01/2019   Procedure: COMPUTER ASSISTED TOTAL KNEE ARTHROPLASTY;  Surgeon: Dereck Leep, MD;  Location: [facility];  Service: Orthopedics;  Laterality: Left;  . KNEE SURGERY Left 1976  . MASTECTOMY Right 2015   breast cancer only treated surgically and with Letrozole  . TONSILLECTOMY     Social History   Socioeconomic History  . Marital status: Married    Spouse name: Not on file  . Number of children: 2  . Years of education: Not on file  . Highest education level: High school graduate  Occupational History  . Not on file  Tobacco Use  . Smoking status: Never Smoker  . Smokeless tobacco: Never Used  Substance and Sexual Activity  . Alcohol use: Never  . Drug use: Never  . Sexual activity: Not Currently  Other Topics Concern  . Not on file  Social History Narrative  . Not on file   Social Determinants of Health   Financial Resource Strain:   . Difficulty of Paying Living Expenses: Not on file  Food Insecurity:   . Worried About Charity fundraiser in the Last Year: Not on file  . Ran Out of Food in the Last Year: Not on file  Transportation Needs:   . Lack of Transportation (Medical): Not on file  . Lack of Transportation (Non-Medical): Not on file  Physical Activity:   . Days of Exercise per Week: Not on file  . Minutes of  Exercise per Session: Not on file  Stress:   . Feeling of Stress : Not on file  Social Connections:   . Frequency of Communication with Friends and Family: Not on file  . Frequency of Social Gatherings with Friends and Family: Not on file  . Attends Religious Services: Not on file  . Active Member of Clubs or Organizations: Not on file  . Attends Archivist Meetings: Not on file  . Marital Status: Not on file   Family History  Problem Relation Age of Onset  . Stroke Mother 82  . Ovarian cancer Mother   . Pemphigus vulgaris Father   . Aneurysm Sister        Brain  . Hyperlipidemia Daughter   .  Thyroid disease Daughter 10   Allergies  Allergen Reactions  . Codeine Shortness Of Breath  . Oxycodone-Acetaminophen Shortness Of Breath, Nausea And Vomiting and Rash  . Propoxyphene Shortness Of Breath, Nausea And Vomiting and Rash  . Sulfamethoxazole-Trimethoprim Hives and Nausea Only  . Albuterol Other (See Comments)    Passed out, increased bp  . Demerol [Meperidine] Nausea And Vomiting  . Dilaudid [Hydromorphone Hcl] Nausea And Vomiting  . Iodinated Diagnostic Agents Rash and Other (See Comments)    Passed out = this was some time ago and patient thinks she had it again without difficulty Passed out = this was some time ago and patient thinks she had it again without difficulty   . Morphine And Related Nausea And Vomiting  . Other Nausea And Vomiting  . Sulfa Antibiotics Hives  . Lisinopril-Hydrochlorothiazide Nausea Only and Swelling    Tongue swelling  . Amoxicillin-Pot Clavulanate Nausea Only  . Hydrocodone-Acetaminophen Rash  . Levofloxacin Nausea Only  . Promethazine Nausea And Vomiting  . Tramadol Nausea And Vomiting    Patient states that she tolerates   Prior to Admission medications   Medication Sig Start Date End Date Taking? Authorizing Provider  acetaminophen (TYLENOL) 500 MG tablet Take 250 mg by mouth every 6 (six) hours as needed (for pain).    [provider]  amLODipine (NORVASC) 5 MG tablet Take 1 tablet by mouth daily. 09/18/18   [provider]  celecoxib (CELEBREX) 200 MG capsule Take 1 capsule (200 mg total) by mouth 2 (two) times daily. 12/01/19 11/30/20  Marializ Ferrebee, Laurice Record, MD  Cholecalciferol (VITAMIN D3) 50 MCG (2000 UT) TABS Take 2,000 Units by mouth daily in the afternoon.    [provider]  diphenhydramine-acetaminophen (TYLENOL PM) 25-500 MG TABS tablet Take 0.5 tablets by mouth at bedtime as needed (pain.).     [provider]  enoxaparin (LOVENOX) 40 MG/0.4ML injection Inject 0.4 mLs (40 mg total) into the skin  daily. 12/01/19   Redford Behrle, Laurice Record, MD  famotidine (PEPCID) 20 MG tablet Take 1 tablet (20 mg total) by mouth 2 (two) times daily as needed for heartburn or indigestion. 07/29/19   Mikey College, NP  Nyoka Cowden Tea Oil Fragrance OIL Take 1 drop by mouth daily.     [provider]  letrozole (FEMARA) 2.5 MG tablet Take 2.5 mg by mouth daily before breakfast.     [provider]  levothyroxine (SYNTHROID) 112 MCG tablet Take 1 tablet (112 mcg total) by mouth daily before breakfast. 09/09/18   Mikey College, NP  losartan-hydrochlorothiazide (HYZAAR) 50-12.5 MG tablet Take 1 tablet by mouth daily. 07/29/19   Mikey College, NP  Multiple Vitamin (MULTIVITAMIN WITH MINERALS) TABS tablet Take 1 tablet  by mouth daily in the afternoon.    [provider]  omeprazole (PRILOSEC) 20 MG capsule Take 20 mg by mouth at bedtime.    [provider]  oxyCODONE (ROXICODONE) 5 MG immediate release tablet Take 1-2 tablets (5-10 mg total) by mouth every 4 (four) hours as needed for severe pain. 12/01/19   Brodric Schauer, Laurice Record, MD  rosuvastatin (CRESTOR) 5 MG tablet Take 1 tablet (5 mg total) by mouth daily. Patient taking differently: Take 5 mg by mouth every other day. At night 02/01/19   Mikey College, NP  traMADol (ULTRAM) 50 MG tablet Take 1 tablet (50 mg total) by mouth every 6 (six) hours as needed for moderate pain. 12/01/19   Cherylin Waguespack, Laurice Record, MD  vitamin B-12 (CYANOCOBALAMIN) 1000 MCG tablet Take 1,000 mcg by mouth daily in the afternoon.     [provider]  vitamin E 400 UNIT capsule Take 1 capsule by mouth daily.    [provider]   DG Chest 1 View  Result Date: 12/04/2019 CLINICAL DATA:  Syncope.  Fevers. EXAM: CHEST  1 VIEW COMPARISON:  None. FINDINGS: The heart size and mediastinal contours are within normal limits. Both lungs are clear. The visualized skeletal structures are unremarkable. IMPRESSION: No active disease. Electronically  Signed   By: Kerby Moors M.D.   On: 12/04/2019 12:52   US Venous Img Lower Unilateral Left  Result Date: 12/04/2019 CLINICAL DATA:  Postop left knee replacement, pain and swelling EXAM: LEFT LOWER EXTREMITY VENOUS DOPPLER ULTRASOUND TECHNIQUE: Gray-scale sonography with graded compression, as well as color Doppler and duplex ultrasound were performed to evaluate the lower extremity deep venous systems from the level of the common femoral vein and including the common femoral, femoral, profunda femoral, popliteal and calf veins including the posterior tibial, peroneal and gastrocnemius veins when visible. The superficial great saphenous vein was also interrogated. Spectral Doppler was utilized to evaluate flow at rest and with distal augmentation maneuvers in the common femoral, femoral and popliteal veins. COMPARISON:  None. FINDINGS: Contralateral Common Femoral Vein: Respiratory phasicity is normal and symmetric with the symptomatic side. No evidence of thrombus. Normal compressibility. Common Femoral Vein: No evidence of thrombus. Normal compressibility, respiratory phasicity and response to augmentation. Saphenofemoral Junction: No evidence of thrombus. Normal compressibility and flow on color Doppler imaging. Profunda Femoral Vein: No evidence of thrombus. Normal compressibility and flow on color Doppler imaging. Femoral Vein: No evidence of thrombus. Normal compressibility, respiratory phasicity and response to augmentation. Popliteal Vein: No evidence of thrombus. Normal compressibility, respiratory phasicity and response to augmentation. Calf Veins: No evidence of thrombus. Normal compressibility and flow on color Doppler imaging. IMPRESSION: No evidence of significant left lower extremity deep venous thrombosis. Electronically Signed   By: Jerilynn Mages.  Shick M.D.   On: 12/04/2019 12:19    Positive ROS: All other systems have been reviewed and were otherwise negative with the exception of those mentioned in  the HPI and as above.  Physical Exam: General: Well developed, well nourished female seen in no acute distress. HEENT: Atraumatic and normocephalic. Sclera are clear. Extraocular motion is intact. Oropharynx is clear with moist mucosa. Neck: Supple, nontender, good range of motion. No JVD or carotid bruits. Lungs: Clear to auscultation bilaterally. Cardiovascular: Regular rate and rhythm with normal S1 and S2. No murmurs. No gallops or rubs. Pedal pulses are palpable bilaterally. Homans test is negative bilaterally. No significant pretibial or ankle edema. Abdomen: Soft, nontender, and nondistended. Bowel sounds are present. Skin: No  lesions in the area of chief complaint Neurologic: Awake, alert, and oriented. Sensory function is grossly intact. Motor strength is felt to be 5 over 5 bilaterally. No clonus or tremor. Good motor coordination. Lymphatic: No axillary or cervical lymphadenopathy  MUSCULOSKELETAL: Examination of the left knee shows the surgical incision to be well approximated with staples.  No significant erythema.  Mild soft tissue swelling is noted.  The knee is stable to varus and valgus stress.  Gentle knee range of motion is well-tolerated.  Assessment: Status post left total knee arthroplasty Possible orthostatic blood pressure changes.  Plan: The lab work, ultrasound study, and chest x-ray results were discussed with the patient. I discussed the patient's status with Dr. Jacqualine Code.  He will check orthostatic blood pressures and consider IV fluids.  Margaret Townsend P. Holley Bouche M.D.

## 2019-12-04 NOTE — ED Notes (Signed)
Pt still in ultrasound.

## 2019-12-06 DIAGNOSIS — Z471 Aftercare following joint replacement surgery: Secondary | ICD-10-CM | POA: Diagnosis not present

## 2019-12-06 DIAGNOSIS — K219 Gastro-esophageal reflux disease without esophagitis: Secondary | ICD-10-CM | POA: Diagnosis not present

## 2019-12-06 DIAGNOSIS — E785 Hyperlipidemia, unspecified: Secondary | ICD-10-CM | POA: Diagnosis not present

## 2019-12-06 DIAGNOSIS — Z853 Personal history of malignant neoplasm of breast: Secondary | ICD-10-CM | POA: Diagnosis not present

## 2019-12-06 DIAGNOSIS — Z9011 Acquired absence of right breast and nipple: Secondary | ICD-10-CM | POA: Diagnosis not present

## 2019-12-06 DIAGNOSIS — Z79891 Long term (current) use of opiate analgesic: Secondary | ICD-10-CM | POA: Diagnosis not present

## 2019-12-06 DIAGNOSIS — I10 Essential (primary) hypertension: Secondary | ICD-10-CM | POA: Diagnosis not present

## 2019-12-06 DIAGNOSIS — Z96652 Presence of left artificial knee joint: Secondary | ICD-10-CM | POA: Diagnosis not present

## 2019-12-06 DIAGNOSIS — E079 Disorder of thyroid, unspecified: Secondary | ICD-10-CM | POA: Diagnosis not present

## 2019-12-07 DIAGNOSIS — E785 Hyperlipidemia, unspecified: Secondary | ICD-10-CM | POA: Diagnosis not present

## 2019-12-07 DIAGNOSIS — Z853 Personal history of malignant neoplasm of breast: Secondary | ICD-10-CM | POA: Diagnosis not present

## 2019-12-07 DIAGNOSIS — Z9011 Acquired absence of right breast and nipple: Secondary | ICD-10-CM | POA: Diagnosis not present

## 2019-12-07 DIAGNOSIS — Z79891 Long term (current) use of opiate analgesic: Secondary | ICD-10-CM | POA: Diagnosis not present

## 2019-12-07 DIAGNOSIS — I10 Essential (primary) hypertension: Secondary | ICD-10-CM | POA: Diagnosis not present

## 2019-12-07 DIAGNOSIS — Z96652 Presence of left artificial knee joint: Secondary | ICD-10-CM | POA: Diagnosis not present

## 2019-12-07 DIAGNOSIS — K219 Gastro-esophageal reflux disease without esophagitis: Secondary | ICD-10-CM | POA: Diagnosis not present

## 2019-12-07 DIAGNOSIS — Z471 Aftercare following joint replacement surgery: Secondary | ICD-10-CM | POA: Diagnosis not present

## 2019-12-07 DIAGNOSIS — E079 Disorder of thyroid, unspecified: Secondary | ICD-10-CM | POA: Diagnosis not present

## 2019-12-08 DIAGNOSIS — Z9011 Acquired absence of right breast and nipple: Secondary | ICD-10-CM | POA: Diagnosis not present

## 2019-12-08 DIAGNOSIS — E079 Disorder of thyroid, unspecified: Secondary | ICD-10-CM | POA: Diagnosis not present

## 2019-12-08 DIAGNOSIS — Z853 Personal history of malignant neoplasm of breast: Secondary | ICD-10-CM | POA: Diagnosis not present

## 2019-12-08 DIAGNOSIS — Z96652 Presence of left artificial knee joint: Secondary | ICD-10-CM | POA: Diagnosis not present

## 2019-12-08 DIAGNOSIS — I10 Essential (primary) hypertension: Secondary | ICD-10-CM | POA: Diagnosis not present

## 2019-12-08 DIAGNOSIS — Z471 Aftercare following joint replacement surgery: Secondary | ICD-10-CM | POA: Diagnosis not present

## 2019-12-08 DIAGNOSIS — E785 Hyperlipidemia, unspecified: Secondary | ICD-10-CM | POA: Diagnosis not present

## 2019-12-08 DIAGNOSIS — Z79891 Long term (current) use of opiate analgesic: Secondary | ICD-10-CM | POA: Diagnosis not present

## 2019-12-08 DIAGNOSIS — K219 Gastro-esophageal reflux disease without esophagitis: Secondary | ICD-10-CM | POA: Diagnosis not present

## 2019-12-09 DIAGNOSIS — Z853 Personal history of malignant neoplasm of breast: Secondary | ICD-10-CM | POA: Diagnosis not present

## 2019-12-09 DIAGNOSIS — K219 Gastro-esophageal reflux disease without esophagitis: Secondary | ICD-10-CM | POA: Diagnosis not present

## 2019-12-09 DIAGNOSIS — Z471 Aftercare following joint replacement surgery: Secondary | ICD-10-CM | POA: Diagnosis not present

## 2019-12-09 DIAGNOSIS — Z79891 Long term (current) use of opiate analgesic: Secondary | ICD-10-CM | POA: Diagnosis not present

## 2019-12-09 DIAGNOSIS — E785 Hyperlipidemia, unspecified: Secondary | ICD-10-CM | POA: Diagnosis not present

## 2019-12-09 DIAGNOSIS — E079 Disorder of thyroid, unspecified: Secondary | ICD-10-CM | POA: Diagnosis not present

## 2019-12-09 DIAGNOSIS — Z9011 Acquired absence of right breast and nipple: Secondary | ICD-10-CM | POA: Diagnosis not present

## 2019-12-09 DIAGNOSIS — I10 Essential (primary) hypertension: Secondary | ICD-10-CM | POA: Diagnosis not present

## 2019-12-09 DIAGNOSIS — Z96652 Presence of left artificial knee joint: Secondary | ICD-10-CM | POA: Diagnosis not present

## 2019-12-13 DIAGNOSIS — K219 Gastro-esophageal reflux disease without esophagitis: Secondary | ICD-10-CM | POA: Diagnosis not present

## 2019-12-13 DIAGNOSIS — Z96652 Presence of left artificial knee joint: Secondary | ICD-10-CM | POA: Diagnosis not present

## 2019-12-13 DIAGNOSIS — E079 Disorder of thyroid, unspecified: Secondary | ICD-10-CM | POA: Diagnosis not present

## 2019-12-13 DIAGNOSIS — Z79891 Long term (current) use of opiate analgesic: Secondary | ICD-10-CM | POA: Diagnosis not present

## 2019-12-13 DIAGNOSIS — Z853 Personal history of malignant neoplasm of breast: Secondary | ICD-10-CM | POA: Diagnosis not present

## 2019-12-13 DIAGNOSIS — Z9011 Acquired absence of right breast and nipple: Secondary | ICD-10-CM | POA: Diagnosis not present

## 2019-12-13 DIAGNOSIS — E785 Hyperlipidemia, unspecified: Secondary | ICD-10-CM | POA: Diagnosis not present

## 2019-12-13 DIAGNOSIS — I10 Essential (primary) hypertension: Secondary | ICD-10-CM | POA: Diagnosis not present

## 2019-12-13 DIAGNOSIS — Z471 Aftercare following joint replacement surgery: Secondary | ICD-10-CM | POA: Diagnosis not present

## 2019-12-14 DIAGNOSIS — E785 Hyperlipidemia, unspecified: Secondary | ICD-10-CM | POA: Diagnosis not present

## 2019-12-14 DIAGNOSIS — Z79891 Long term (current) use of opiate analgesic: Secondary | ICD-10-CM | POA: Diagnosis not present

## 2019-12-14 DIAGNOSIS — Z96652 Presence of left artificial knee joint: Secondary | ICD-10-CM | POA: Diagnosis not present

## 2019-12-14 DIAGNOSIS — E079 Disorder of thyroid, unspecified: Secondary | ICD-10-CM | POA: Diagnosis not present

## 2019-12-14 DIAGNOSIS — I10 Essential (primary) hypertension: Secondary | ICD-10-CM | POA: Diagnosis not present

## 2019-12-14 DIAGNOSIS — Z853 Personal history of malignant neoplasm of breast: Secondary | ICD-10-CM | POA: Diagnosis not present

## 2019-12-14 DIAGNOSIS — Z9011 Acquired absence of right breast and nipple: Secondary | ICD-10-CM | POA: Diagnosis not present

## 2019-12-14 DIAGNOSIS — Z471 Aftercare following joint replacement surgery: Secondary | ICD-10-CM | POA: Diagnosis not present

## 2019-12-14 DIAGNOSIS — K219 Gastro-esophageal reflux disease without esophagitis: Secondary | ICD-10-CM | POA: Diagnosis not present

## 2019-12-15 DIAGNOSIS — Z853 Personal history of malignant neoplasm of breast: Secondary | ICD-10-CM | POA: Diagnosis not present

## 2019-12-15 DIAGNOSIS — Z471 Aftercare following joint replacement surgery: Secondary | ICD-10-CM | POA: Diagnosis not present

## 2019-12-15 DIAGNOSIS — K219 Gastro-esophageal reflux disease without esophagitis: Secondary | ICD-10-CM | POA: Diagnosis not present

## 2019-12-15 DIAGNOSIS — E785 Hyperlipidemia, unspecified: Secondary | ICD-10-CM | POA: Diagnosis not present

## 2019-12-15 DIAGNOSIS — Z79891 Long term (current) use of opiate analgesic: Secondary | ICD-10-CM | POA: Diagnosis not present

## 2019-12-15 DIAGNOSIS — I10 Essential (primary) hypertension: Secondary | ICD-10-CM | POA: Diagnosis not present

## 2019-12-15 DIAGNOSIS — Z9011 Acquired absence of right breast and nipple: Secondary | ICD-10-CM | POA: Diagnosis not present

## 2019-12-15 DIAGNOSIS — Z96652 Presence of left artificial knee joint: Secondary | ICD-10-CM | POA: Diagnosis not present

## 2019-12-15 DIAGNOSIS — E079 Disorder of thyroid, unspecified: Secondary | ICD-10-CM | POA: Diagnosis not present

## 2019-12-16 ENCOUNTER — Telehealth: Payer: Self-pay

## 2019-12-16 DIAGNOSIS — M25562 Pain in left knee: Secondary | ICD-10-CM | POA: Diagnosis not present

## 2019-12-16 DIAGNOSIS — Z96652 Presence of left artificial knee joint: Secondary | ICD-10-CM | POA: Diagnosis not present

## 2019-12-16 DIAGNOSIS — M25662 Stiffness of left knee, not elsewhere classified: Secondary | ICD-10-CM | POA: Diagnosis not present

## 2019-12-16 DIAGNOSIS — R29898 Other symptoms and signs involving the musculoskeletal system: Secondary | ICD-10-CM | POA: Diagnosis not present

## 2019-12-20 DIAGNOSIS — Z96652 Presence of left artificial knee joint: Secondary | ICD-10-CM | POA: Diagnosis not present

## 2019-12-20 DIAGNOSIS — M25562 Pain in left knee: Secondary | ICD-10-CM | POA: Diagnosis not present

## 2019-12-22 DIAGNOSIS — Z96652 Presence of left artificial knee joint: Secondary | ICD-10-CM | POA: Diagnosis not present

## 2019-12-24 DIAGNOSIS — Z96652 Presence of left artificial knee joint: Secondary | ICD-10-CM | POA: Diagnosis not present

## 2019-12-24 DIAGNOSIS — M25562 Pain in left knee: Secondary | ICD-10-CM | POA: Diagnosis not present

## 2019-12-27 DIAGNOSIS — Z96652 Presence of left artificial knee joint: Secondary | ICD-10-CM | POA: Diagnosis not present

## 2019-12-27 DIAGNOSIS — M25562 Pain in left knee: Secondary | ICD-10-CM | POA: Diagnosis not present

## 2019-12-29 DIAGNOSIS — Z96652 Presence of left artificial knee joint: Secondary | ICD-10-CM | POA: Diagnosis not present

## 2019-12-31 DIAGNOSIS — M25562 Pain in left knee: Secondary | ICD-10-CM | POA: Diagnosis not present

## 2019-12-31 DIAGNOSIS — Z96652 Presence of left artificial knee joint: Secondary | ICD-10-CM | POA: Diagnosis not present

## 2020-01-03 DIAGNOSIS — Z96652 Presence of left artificial knee joint: Secondary | ICD-10-CM | POA: Diagnosis not present

## 2020-01-03 DIAGNOSIS — M25562 Pain in left knee: Secondary | ICD-10-CM | POA: Diagnosis not present

## 2020-01-05 DIAGNOSIS — M25562 Pain in left knee: Secondary | ICD-10-CM | POA: Diagnosis not present

## 2020-01-05 DIAGNOSIS — Z96652 Presence of left artificial knee joint: Secondary | ICD-10-CM | POA: Diagnosis not present

## 2020-01-07 DIAGNOSIS — M25562 Pain in left knee: Secondary | ICD-10-CM | POA: Diagnosis not present

## 2020-01-07 DIAGNOSIS — Z96652 Presence of left artificial knee joint: Secondary | ICD-10-CM | POA: Diagnosis not present

## 2020-01-13 ENCOUNTER — Telehealth: Payer: Self-pay

## 2020-01-13 DIAGNOSIS — M1712 Unilateral primary osteoarthritis, left knee: Secondary | ICD-10-CM | POA: Diagnosis not present

## 2020-01-17 DIAGNOSIS — Z170 Estrogen receptor positive status [ER+]: Secondary | ICD-10-CM | POA: Diagnosis not present

## 2020-01-17 DIAGNOSIS — Z79811 Long term (current) use of aromatase inhibitors: Secondary | ICD-10-CM | POA: Diagnosis not present

## 2020-01-17 DIAGNOSIS — M1712 Unilateral primary osteoarthritis, left knee: Secondary | ICD-10-CM | POA: Diagnosis not present

## 2020-01-17 DIAGNOSIS — C50911 Malignant neoplasm of unspecified site of right female breast: Secondary | ICD-10-CM | POA: Diagnosis not present

## 2020-01-17 DIAGNOSIS — C50411 Malignant neoplasm of upper-outer quadrant of right female breast: Secondary | ICD-10-CM | POA: Diagnosis not present

## 2020-01-17 DIAGNOSIS — R922 Inconclusive mammogram: Secondary | ICD-10-CM | POA: Diagnosis not present

## 2020-01-17 DIAGNOSIS — Z1231 Encounter for screening mammogram for malignant neoplasm of breast: Secondary | ICD-10-CM | POA: Diagnosis not present

## 2020-01-17 DIAGNOSIS — Z9049 Acquired absence of other specified parts of digestive tract: Secondary | ICD-10-CM | POA: Diagnosis not present

## 2020-01-17 DIAGNOSIS — Z9011 Acquired absence of right breast and nipple: Secondary | ICD-10-CM | POA: Diagnosis not present

## 2020-01-17 DIAGNOSIS — Z96652 Presence of left artificial knee joint: Secondary | ICD-10-CM | POA: Diagnosis not present

## 2020-01-17 DIAGNOSIS — Z90722 Acquired absence of ovaries, bilateral: Secondary | ICD-10-CM | POA: Diagnosis not present

## 2020-01-17 DIAGNOSIS — Z780 Asymptomatic menopausal state: Secondary | ICD-10-CM | POA: Diagnosis not present

## 2020-01-17 DIAGNOSIS — Z885 Allergy status to narcotic agent status: Secondary | ICD-10-CM | POA: Diagnosis not present

## 2020-01-25 DIAGNOSIS — Z780 Asymptomatic menopausal state: Secondary | ICD-10-CM | POA: Diagnosis not present

## 2020-01-27 DIAGNOSIS — Z471 Aftercare following joint replacement surgery: Secondary | ICD-10-CM | POA: Diagnosis not present

## 2020-02-07 DIAGNOSIS — I10 Essential (primary) hypertension: Secondary | ICD-10-CM | POA: Diagnosis not present

## 2020-02-07 DIAGNOSIS — R7303 Prediabetes: Secondary | ICD-10-CM | POA: Diagnosis not present

## 2020-02-07 DIAGNOSIS — E782 Mixed hyperlipidemia: Secondary | ICD-10-CM | POA: Diagnosis not present

## 2020-02-14 DIAGNOSIS — I10 Essential (primary) hypertension: Secondary | ICD-10-CM | POA: Diagnosis not present

## 2020-02-14 DIAGNOSIS — E039 Hypothyroidism, unspecified: Secondary | ICD-10-CM | POA: Diagnosis not present

## 2020-02-14 DIAGNOSIS — Z853 Personal history of malignant neoplasm of breast: Secondary | ICD-10-CM | POA: Diagnosis not present

## 2020-02-14 DIAGNOSIS — R7303 Prediabetes: Secondary | ICD-10-CM | POA: Diagnosis not present

## 2020-02-14 DIAGNOSIS — Z96652 Presence of left artificial knee joint: Secondary | ICD-10-CM | POA: Diagnosis not present

## 2020-02-14 DIAGNOSIS — M1712 Unilateral primary osteoarthritis, left knee: Secondary | ICD-10-CM | POA: Diagnosis not present

## 2020-02-14 DIAGNOSIS — E782 Mixed hyperlipidemia: Secondary | ICD-10-CM | POA: Diagnosis not present

## 2020-02-14 DIAGNOSIS — Z0000 Encounter for general adult medical examination without abnormal findings: Secondary | ICD-10-CM | POA: Diagnosis not present

## 2020-02-17 ENCOUNTER — Telehealth: Payer: Self-pay

## 2020-02-21 DIAGNOSIS — R7303 Prediabetes: Secondary | ICD-10-CM | POA: Diagnosis not present

## 2020-02-21 DIAGNOSIS — E782 Mixed hyperlipidemia: Secondary | ICD-10-CM | POA: Diagnosis not present

## 2020-02-21 DIAGNOSIS — Z0000 Encounter for general adult medical examination without abnormal findings: Secondary | ICD-10-CM | POA: Diagnosis not present

## 2020-02-21 DIAGNOSIS — I10 Essential (primary) hypertension: Secondary | ICD-10-CM | POA: Diagnosis not present

## 2020-02-21 DIAGNOSIS — Z1211 Encounter for screening for malignant neoplasm of colon: Secondary | ICD-10-CM | POA: Diagnosis not present

## 2020-03-27 DIAGNOSIS — Z79899 Other long term (current) drug therapy: Secondary | ICD-10-CM | POA: Diagnosis not present

## 2020-03-27 DIAGNOSIS — M2559 Pain in other specified joint: Secondary | ICD-10-CM | POA: Diagnosis not present

## 2020-03-27 DIAGNOSIS — Z79890 Hormone replacement therapy (postmenopausal): Secondary | ICD-10-CM | POA: Diagnosis not present

## 2020-03-27 DIAGNOSIS — E039 Hypothyroidism, unspecified: Secondary | ICD-10-CM | POA: Diagnosis not present

## 2020-03-27 DIAGNOSIS — I10 Essential (primary) hypertension: Secondary | ICD-10-CM | POA: Diagnosis not present

## 2020-03-27 DIAGNOSIS — R5382 Chronic fatigue, unspecified: Secondary | ICD-10-CM | POA: Diagnosis not present

## 2020-03-27 DIAGNOSIS — R002 Palpitations: Secondary | ICD-10-CM | POA: Diagnosis not present

## 2020-04-10 DIAGNOSIS — I10 Essential (primary) hypertension: Secondary | ICD-10-CM | POA: Diagnosis not present

## 2020-04-10 DIAGNOSIS — E039 Hypothyroidism, unspecified: Secondary | ICD-10-CM | POA: Diagnosis not present

## 2020-04-10 DIAGNOSIS — Z79890 Hormone replacement therapy (postmenopausal): Secondary | ICD-10-CM | POA: Diagnosis not present

## 2020-04-10 DIAGNOSIS — R600 Localized edema: Secondary | ICD-10-CM | POA: Diagnosis not present

## 2020-04-10 DIAGNOSIS — R5382 Chronic fatigue, unspecified: Secondary | ICD-10-CM | POA: Diagnosis not present

## 2020-04-24 ENCOUNTER — Other Ambulatory Visit: Payer: Self-pay | Admitting: Nurse Practitioner

## 2020-04-24 DIAGNOSIS — I10 Essential (primary) hypertension: Secondary | ICD-10-CM

## 2020-07-19 DIAGNOSIS — R42 Dizziness and giddiness: Secondary | ICD-10-CM | POA: Diagnosis not present

## 2020-07-19 DIAGNOSIS — H9203 Otalgia, bilateral: Secondary | ICD-10-CM | POA: Diagnosis not present

## 2020-08-03 DIAGNOSIS — Z961 Presence of intraocular lens: Secondary | ICD-10-CM | POA: Diagnosis not present

## 2020-08-03 DIAGNOSIS — H353131 Nonexudative age-related macular degeneration, bilateral, early dry stage: Secondary | ICD-10-CM | POA: Diagnosis not present

## 2020-08-09 DIAGNOSIS — I10 Essential (primary) hypertension: Secondary | ICD-10-CM | POA: Diagnosis not present

## 2020-08-09 DIAGNOSIS — R7303 Prediabetes: Secondary | ICD-10-CM | POA: Diagnosis not present

## 2020-08-09 DIAGNOSIS — E039 Hypothyroidism, unspecified: Secondary | ICD-10-CM | POA: Diagnosis not present

## 2020-08-09 DIAGNOSIS — E782 Mixed hyperlipidemia: Secondary | ICD-10-CM | POA: Diagnosis not present

## 2020-08-16 DIAGNOSIS — E039 Hypothyroidism, unspecified: Secondary | ICD-10-CM | POA: Diagnosis not present

## 2020-08-16 DIAGNOSIS — E782 Mixed hyperlipidemia: Secondary | ICD-10-CM | POA: Diagnosis not present

## 2020-08-16 DIAGNOSIS — R5382 Chronic fatigue, unspecified: Secondary | ICD-10-CM | POA: Diagnosis not present

## 2020-08-16 DIAGNOSIS — E559 Vitamin D deficiency, unspecified: Secondary | ICD-10-CM | POA: Diagnosis not present

## 2020-08-16 DIAGNOSIS — I10 Essential (primary) hypertension: Secondary | ICD-10-CM | POA: Diagnosis not present

## 2020-08-16 DIAGNOSIS — R42 Dizziness and giddiness: Secondary | ICD-10-CM | POA: Diagnosis not present

## 2020-08-16 DIAGNOSIS — R7303 Prediabetes: Secondary | ICD-10-CM | POA: Diagnosis not present

## 2020-12-05 DIAGNOSIS — Z96652 Presence of left artificial knee joint: Secondary | ICD-10-CM | POA: Diagnosis not present

## 2020-12-31 DIAGNOSIS — Z03818 Encounter for observation for suspected exposure to other biological agents ruled out: Secondary | ICD-10-CM | POA: Diagnosis not present

## 2020-12-31 DIAGNOSIS — Z20822 Contact with and (suspected) exposure to covid-19: Secondary | ICD-10-CM | POA: Diagnosis not present

## 2021-01-11 DIAGNOSIS — Z20822 Contact with and (suspected) exposure to covid-19: Secondary | ICD-10-CM | POA: Diagnosis not present

## 2021-01-17 DIAGNOSIS — R7303 Prediabetes: Secondary | ICD-10-CM | POA: Diagnosis not present

## 2021-01-17 DIAGNOSIS — U071 COVID-19: Secondary | ICD-10-CM | POA: Diagnosis not present

## 2021-01-17 DIAGNOSIS — J208 Acute bronchitis due to other specified organisms: Secondary | ICD-10-CM | POA: Diagnosis not present

## 2021-01-17 DIAGNOSIS — I10 Essential (primary) hypertension: Secondary | ICD-10-CM | POA: Diagnosis not present

## 2021-01-17 DIAGNOSIS — R42 Dizziness and giddiness: Secondary | ICD-10-CM | POA: Diagnosis not present

## 2021-01-22 DIAGNOSIS — I10 Essential (primary) hypertension: Secondary | ICD-10-CM | POA: Diagnosis not present

## 2021-01-22 DIAGNOSIS — E039 Hypothyroidism, unspecified: Secondary | ICD-10-CM | POA: Diagnosis not present

## 2021-01-22 DIAGNOSIS — R531 Weakness: Secondary | ICD-10-CM | POA: Diagnosis not present

## 2021-02-08 DIAGNOSIS — I10 Essential (primary) hypertension: Secondary | ICD-10-CM | POA: Diagnosis not present

## 2021-02-08 DIAGNOSIS — E782 Mixed hyperlipidemia: Secondary | ICD-10-CM | POA: Diagnosis not present

## 2021-02-08 DIAGNOSIS — R7303 Prediabetes: Secondary | ICD-10-CM | POA: Diagnosis not present

## 2021-02-15 DIAGNOSIS — E782 Mixed hyperlipidemia: Secondary | ICD-10-CM | POA: Diagnosis not present

## 2021-02-15 DIAGNOSIS — E039 Hypothyroidism, unspecified: Secondary | ICD-10-CM | POA: Diagnosis not present

## 2021-02-15 DIAGNOSIS — C50911 Malignant neoplasm of unspecified site of right female breast: Secondary | ICD-10-CM | POA: Diagnosis not present

## 2021-02-15 DIAGNOSIS — Z0000 Encounter for general adult medical examination without abnormal findings: Secondary | ICD-10-CM | POA: Diagnosis not present

## 2021-02-15 DIAGNOSIS — Z170 Estrogen receptor positive status [ER+]: Secondary | ICD-10-CM | POA: Diagnosis not present

## 2021-02-15 DIAGNOSIS — I10 Essential (primary) hypertension: Secondary | ICD-10-CM | POA: Diagnosis not present

## 2021-02-15 DIAGNOSIS — R7303 Prediabetes: Secondary | ICD-10-CM | POA: Diagnosis not present

## 2021-03-08 DIAGNOSIS — I10 Essential (primary) hypertension: Secondary | ICD-10-CM | POA: Diagnosis not present

## 2021-03-08 DIAGNOSIS — J310 Chronic rhinitis: Secondary | ICD-10-CM | POA: Diagnosis not present

## 2021-03-08 DIAGNOSIS — E785 Hyperlipidemia, unspecified: Secondary | ICD-10-CM | POA: Diagnosis not present

## 2021-03-08 DIAGNOSIS — E039 Hypothyroidism, unspecified: Secondary | ICD-10-CM | POA: Diagnosis not present

## 2021-03-08 DIAGNOSIS — Z1231 Encounter for screening mammogram for malignant neoplasm of breast: Secondary | ICD-10-CM | POA: Diagnosis not present

## 2021-03-08 DIAGNOSIS — C50911 Malignant neoplasm of unspecified site of right female breast: Secondary | ICD-10-CM | POA: Diagnosis not present

## 2021-03-08 DIAGNOSIS — Z780 Asymptomatic menopausal state: Secondary | ICD-10-CM | POA: Diagnosis not present

## 2021-03-08 DIAGNOSIS — M47816 Spondylosis without myelopathy or radiculopathy, lumbar region: Secondary | ICD-10-CM | POA: Diagnosis not present

## 2021-03-08 DIAGNOSIS — M1712 Unilateral primary osteoarthritis, left knee: Secondary | ICD-10-CM | POA: Diagnosis not present

## 2021-03-08 DIAGNOSIS — Z170 Estrogen receptor positive status [ER+]: Secondary | ICD-10-CM | POA: Diagnosis not present

## 2021-03-15 DIAGNOSIS — E538 Deficiency of other specified B group vitamins: Secondary | ICD-10-CM | POA: Diagnosis not present

## 2021-03-15 DIAGNOSIS — Z170 Estrogen receptor positive status [ER+]: Secondary | ICD-10-CM | POA: Diagnosis not present

## 2021-03-15 DIAGNOSIS — G629 Polyneuropathy, unspecified: Secondary | ICD-10-CM | POA: Diagnosis not present

## 2021-03-15 DIAGNOSIS — R5382 Chronic fatigue, unspecified: Secondary | ICD-10-CM | POA: Diagnosis not present

## 2021-03-15 DIAGNOSIS — I10 Essential (primary) hypertension: Secondary | ICD-10-CM | POA: Diagnosis not present

## 2021-03-15 DIAGNOSIS — C50911 Malignant neoplasm of unspecified site of right female breast: Secondary | ICD-10-CM | POA: Diagnosis not present

## 2021-04-05 DIAGNOSIS — E538 Deficiency of other specified B group vitamins: Secondary | ICD-10-CM | POA: Diagnosis not present

## 2021-04-05 DIAGNOSIS — E7800 Pure hypercholesterolemia, unspecified: Secondary | ICD-10-CM | POA: Diagnosis not present

## 2021-04-05 DIAGNOSIS — M79604 Pain in right leg: Secondary | ICD-10-CM | POA: Diagnosis not present

## 2021-04-05 DIAGNOSIS — E559 Vitamin D deficiency, unspecified: Secondary | ICD-10-CM | POA: Diagnosis not present

## 2021-04-05 DIAGNOSIS — I10 Essential (primary) hypertension: Secondary | ICD-10-CM | POA: Diagnosis not present

## 2021-04-05 DIAGNOSIS — R7303 Prediabetes: Secondary | ICD-10-CM | POA: Diagnosis not present

## 2021-04-05 DIAGNOSIS — E039 Hypothyroidism, unspecified: Secondary | ICD-10-CM | POA: Diagnosis not present

## 2021-04-05 DIAGNOSIS — M79605 Pain in left leg: Secondary | ICD-10-CM | POA: Diagnosis not present

## 2021-04-05 DIAGNOSIS — G629 Polyneuropathy, unspecified: Secondary | ICD-10-CM | POA: Diagnosis not present

## 2021-04-13 DIAGNOSIS — M79605 Pain in left leg: Secondary | ICD-10-CM | POA: Diagnosis not present

## 2021-04-13 DIAGNOSIS — M79604 Pain in right leg: Secondary | ICD-10-CM | POA: Diagnosis not present

## 2021-04-17 DIAGNOSIS — G8929 Other chronic pain: Secondary | ICD-10-CM | POA: Diagnosis not present

## 2021-04-17 DIAGNOSIS — R5383 Other fatigue: Secondary | ICD-10-CM | POA: Diagnosis not present

## 2021-04-17 DIAGNOSIS — M545 Low back pain, unspecified: Secondary | ICD-10-CM | POA: Diagnosis not present

## 2021-04-17 DIAGNOSIS — M549 Dorsalgia, unspecified: Secondary | ICD-10-CM | POA: Diagnosis not present

## 2021-04-17 DIAGNOSIS — G629 Polyneuropathy, unspecified: Secondary | ICD-10-CM | POA: Diagnosis not present

## 2021-04-17 DIAGNOSIS — M439 Deforming dorsopathy, unspecified: Secondary | ICD-10-CM | POA: Diagnosis not present

## 2021-04-17 DIAGNOSIS — R600 Localized edema: Secondary | ICD-10-CM | POA: Diagnosis not present

## 2021-05-07 DIAGNOSIS — E538 Deficiency of other specified B group vitamins: Secondary | ICD-10-CM | POA: Diagnosis not present

## 2021-05-07 DIAGNOSIS — R002 Palpitations: Secondary | ICD-10-CM | POA: Diagnosis not present

## 2021-05-07 DIAGNOSIS — R531 Weakness: Secondary | ICD-10-CM | POA: Diagnosis not present

## 2021-05-07 DIAGNOSIS — I10 Essential (primary) hypertension: Secondary | ICD-10-CM | POA: Diagnosis not present

## 2021-05-15 DIAGNOSIS — R002 Palpitations: Secondary | ICD-10-CM | POA: Diagnosis not present

## 2021-05-15 DIAGNOSIS — I10 Essential (primary) hypertension: Secondary | ICD-10-CM | POA: Diagnosis not present

## 2021-05-15 DIAGNOSIS — R531 Weakness: Secondary | ICD-10-CM | POA: Diagnosis not present

## 2021-08-02 DIAGNOSIS — E538 Deficiency of other specified B group vitamins: Secondary | ICD-10-CM | POA: Diagnosis not present

## 2021-08-02 DIAGNOSIS — E039 Hypothyroidism, unspecified: Secondary | ICD-10-CM | POA: Diagnosis not present

## 2021-08-02 DIAGNOSIS — R7303 Prediabetes: Secondary | ICD-10-CM | POA: Diagnosis not present

## 2021-08-02 DIAGNOSIS — I10 Essential (primary) hypertension: Secondary | ICD-10-CM | POA: Diagnosis not present

## 2021-08-02 DIAGNOSIS — E559 Vitamin D deficiency, unspecified: Secondary | ICD-10-CM | POA: Diagnosis not present

## 2021-08-02 DIAGNOSIS — E7800 Pure hypercholesterolemia, unspecified: Secondary | ICD-10-CM | POA: Diagnosis not present

## 2021-08-02 DIAGNOSIS — G629 Polyneuropathy, unspecified: Secondary | ICD-10-CM | POA: Diagnosis not present

## 2021-08-09 ENCOUNTER — Other Ambulatory Visit
Admission: RE | Admit: 2021-08-09 | Discharge: 2021-08-09 | Disposition: A | Discharge status: Home / Self Care | Payer: Medicare HMO | Source: Ambulatory Visit | Attending: Family Medicine | Admitting: Family Medicine

## 2021-08-09 ENCOUNTER — Other Ambulatory Visit: Payer: Self-pay | Admitting: Family Medicine

## 2021-08-09 DIAGNOSIS — R7989 Other specified abnormal findings of blood chemistry: Secondary | ICD-10-CM

## 2021-08-09 DIAGNOSIS — R5383 Other fatigue: Secondary | ICD-10-CM | POA: Diagnosis not present

## 2021-08-09 DIAGNOSIS — R0602 Shortness of breath: Secondary | ICD-10-CM | POA: Diagnosis not present

## 2021-08-09 DIAGNOSIS — E538 Deficiency of other specified B group vitamins: Secondary | ICD-10-CM | POA: Diagnosis not present

## 2021-08-09 DIAGNOSIS — I10 Essential (primary) hypertension: Secondary | ICD-10-CM | POA: Diagnosis not present

## 2021-08-09 DIAGNOSIS — E782 Mixed hyperlipidemia: Secondary | ICD-10-CM | POA: Diagnosis not present

## 2021-08-09 DIAGNOSIS — E039 Hypothyroidism, unspecified: Secondary | ICD-10-CM | POA: Diagnosis not present

## 2021-08-09 LAB — D-DIMER, QUANTITATIVE: D-Dimer, Quant: 0.7 ug/mL-FEU — ABNORMAL HIGH (ref 0.00–0.50)

## 2021-08-10 ENCOUNTER — Other Ambulatory Visit: Payer: Self-pay

## 2021-08-10 ENCOUNTER — Other Ambulatory Visit: Payer: Self-pay | Admitting: Family Medicine

## 2021-08-10 ENCOUNTER — Encounter
Admission: RE | Admit: 2021-08-10 | Discharge: 2021-08-10 | Disposition: A | Discharge status: Home / Self Care | Payer: Medicare HMO | Source: Ambulatory Visit | Attending: Family Medicine | Admitting: Family Medicine

## 2021-08-10 ENCOUNTER — Ambulatory Visit
Admission: RE | Admit: 2021-08-10 | Discharge: 2021-08-10 | Disposition: A | Discharge status: Home / Self Care | Payer: Medicare HMO | Source: Ambulatory Visit | Attending: Family Medicine | Admitting: Family Medicine

## 2021-08-10 DIAGNOSIS — R5383 Other fatigue: Secondary | ICD-10-CM

## 2021-08-10 DIAGNOSIS — R0602 Shortness of breath: Secondary | ICD-10-CM

## 2021-08-10 DIAGNOSIS — R7989 Other specified abnormal findings of blood chemistry: Secondary | ICD-10-CM | POA: Diagnosis not present

## 2021-08-10 MED ORDER — TECHNETIUM TO 99M ALBUMIN AGGREGATED
4.2700 | Freq: Once | INTRAVENOUS | Status: AC
  Administered 2021-08-10: 4.27 via INTRAVENOUS

## 2021-08-20 DIAGNOSIS — E782 Mixed hyperlipidemia: Secondary | ICD-10-CM | POA: Diagnosis not present

## 2021-08-20 DIAGNOSIS — E7800 Pure hypercholesterolemia, unspecified: Secondary | ICD-10-CM | POA: Diagnosis not present

## 2021-08-20 DIAGNOSIS — E039 Hypothyroidism, unspecified: Secondary | ICD-10-CM | POA: Diagnosis not present

## 2021-08-20 DIAGNOSIS — I10 Essential (primary) hypertension: Secondary | ICD-10-CM | POA: Diagnosis not present

## 2021-08-20 DIAGNOSIS — R002 Palpitations: Secondary | ICD-10-CM | POA: Diagnosis not present

## 2021-10-05 DIAGNOSIS — R8290 Unspecified abnormal findings in urine: Secondary | ICD-10-CM | POA: Diagnosis not present

## 2021-10-05 DIAGNOSIS — R300 Dysuria: Secondary | ICD-10-CM | POA: Diagnosis not present

## 2021-10-10 DIAGNOSIS — I10 Essential (primary) hypertension: Secondary | ICD-10-CM | POA: Diagnosis not present

## 2021-10-10 DIAGNOSIS — E538 Deficiency of other specified B group vitamins: Secondary | ICD-10-CM | POA: Diagnosis not present

## 2021-10-10 DIAGNOSIS — R5383 Other fatigue: Secondary | ICD-10-CM | POA: Diagnosis not present

## 2021-10-10 DIAGNOSIS — R531 Weakness: Secondary | ICD-10-CM | POA: Diagnosis not present

## 2021-10-10 DIAGNOSIS — R0602 Shortness of breath: Secondary | ICD-10-CM | POA: Diagnosis not present

## 2021-10-11 DIAGNOSIS — I10 Essential (primary) hypertension: Secondary | ICD-10-CM | POA: Diagnosis not present

## 2021-10-11 DIAGNOSIS — R42 Dizziness and giddiness: Secondary | ICD-10-CM | POA: Diagnosis not present

## 2021-10-11 DIAGNOSIS — E7800 Pure hypercholesterolemia, unspecified: Secondary | ICD-10-CM | POA: Diagnosis not present

## 2021-10-11 DIAGNOSIS — E782 Mixed hyperlipidemia: Secondary | ICD-10-CM | POA: Diagnosis not present

## 2021-10-11 DIAGNOSIS — R002 Palpitations: Secondary | ICD-10-CM | POA: Diagnosis not present

## 2021-10-11 DIAGNOSIS — R8290 Unspecified abnormal findings in urine: Secondary | ICD-10-CM | POA: Diagnosis not present

## 2021-10-11 DIAGNOSIS — R2689 Other abnormalities of gait and mobility: Secondary | ICD-10-CM | POA: Diagnosis not present

## 2021-10-11 DIAGNOSIS — R7401 Elevation of levels of liver transaminase levels: Secondary | ICD-10-CM | POA: Diagnosis not present

## 2021-10-11 DIAGNOSIS — R079 Chest pain, unspecified: Secondary | ICD-10-CM | POA: Diagnosis not present

## 2021-10-11 DIAGNOSIS — E039 Hypothyroidism, unspecified: Secondary | ICD-10-CM | POA: Diagnosis not present

## 2021-10-29 DIAGNOSIS — R079 Chest pain, unspecified: Secondary | ICD-10-CM | POA: Diagnosis not present

## 2021-11-07 DIAGNOSIS — E782 Mixed hyperlipidemia: Secondary | ICD-10-CM | POA: Diagnosis not present

## 2021-11-07 DIAGNOSIS — R002 Palpitations: Secondary | ICD-10-CM | POA: Diagnosis not present

## 2021-11-07 DIAGNOSIS — I10 Essential (primary) hypertension: Secondary | ICD-10-CM | POA: Diagnosis not present

## 2021-11-07 DIAGNOSIS — E039 Hypothyroidism, unspecified: Secondary | ICD-10-CM | POA: Diagnosis not present

## 2021-11-08 DIAGNOSIS — I10 Essential (primary) hypertension: Secondary | ICD-10-CM | POA: Diagnosis not present

## 2021-11-08 DIAGNOSIS — E782 Mixed hyperlipidemia: Secondary | ICD-10-CM | POA: Diagnosis not present

## 2021-11-08 DIAGNOSIS — E039 Hypothyroidism, unspecified: Secondary | ICD-10-CM | POA: Diagnosis not present

## 2021-12-08 DIAGNOSIS — Z03818 Encounter for observation for suspected exposure to other biological agents ruled out: Secondary | ICD-10-CM | POA: Diagnosis not present

## 2021-12-08 DIAGNOSIS — J209 Acute bronchitis, unspecified: Secondary | ICD-10-CM | POA: Diagnosis not present

## 2022-02-05 DIAGNOSIS — H353131 Nonexudative age-related macular degeneration, bilateral, early dry stage: Secondary | ICD-10-CM | POA: Diagnosis not present

## 2022-02-11 DIAGNOSIS — E782 Mixed hyperlipidemia: Secondary | ICD-10-CM | POA: Diagnosis not present

## 2022-02-11 DIAGNOSIS — R7309 Other abnormal glucose: Secondary | ICD-10-CM | POA: Diagnosis not present

## 2022-02-11 DIAGNOSIS — R531 Weakness: Secondary | ICD-10-CM | POA: Diagnosis not present

## 2022-02-11 DIAGNOSIS — E039 Hypothyroidism, unspecified: Secondary | ICD-10-CM | POA: Diagnosis not present

## 2022-02-11 DIAGNOSIS — I10 Essential (primary) hypertension: Secondary | ICD-10-CM | POA: Diagnosis not present

## 2022-02-18 DIAGNOSIS — Z0000 Encounter for general adult medical examination without abnormal findings: Secondary | ICD-10-CM | POA: Diagnosis not present

## 2022-02-18 DIAGNOSIS — I10 Essential (primary) hypertension: Secondary | ICD-10-CM | POA: Diagnosis not present

## 2022-02-18 DIAGNOSIS — E782 Mixed hyperlipidemia: Secondary | ICD-10-CM | POA: Diagnosis not present

## 2022-02-18 DIAGNOSIS — R5383 Other fatigue: Secondary | ICD-10-CM | POA: Diagnosis not present

## 2022-02-18 DIAGNOSIS — E039 Hypothyroidism, unspecified: Secondary | ICD-10-CM | POA: Diagnosis not present

## 2022-02-18 DIAGNOSIS — R7303 Prediabetes: Secondary | ICD-10-CM | POA: Diagnosis not present

## 2022-04-02 DIAGNOSIS — Z780 Asymptomatic menopausal state: Secondary | ICD-10-CM | POA: Diagnosis not present

## 2022-04-04 DIAGNOSIS — Z90710 Acquired absence of both cervix and uterus: Secondary | ICD-10-CM | POA: Diagnosis not present

## 2022-04-04 DIAGNOSIS — C50811 Malignant neoplasm of overlapping sites of right female breast: Secondary | ICD-10-CM | POA: Diagnosis not present

## 2022-04-04 DIAGNOSIS — Z1231 Encounter for screening mammogram for malignant neoplasm of breast: Secondary | ICD-10-CM | POA: Diagnosis not present

## 2022-04-04 DIAGNOSIS — Z170 Estrogen receptor positive status [ER+]: Secondary | ICD-10-CM | POA: Diagnosis not present

## 2022-04-04 DIAGNOSIS — M170 Bilateral primary osteoarthritis of knee: Secondary | ICD-10-CM | POA: Diagnosis not present

## 2022-04-04 DIAGNOSIS — Z96652 Presence of left artificial knee joint: Secondary | ICD-10-CM | POA: Diagnosis not present

## 2022-04-04 DIAGNOSIS — Z9011 Acquired absence of right breast and nipple: Secondary | ICD-10-CM | POA: Diagnosis not present

## 2022-04-04 DIAGNOSIS — Z9049 Acquired absence of other specified parts of digestive tract: Secondary | ICD-10-CM | POA: Diagnosis not present

## 2022-04-04 DIAGNOSIS — Z8041 Family history of malignant neoplasm of ovary: Secondary | ICD-10-CM | POA: Diagnosis not present

## 2022-04-04 DIAGNOSIS — C50911 Malignant neoplasm of unspecified site of right female breast: Secondary | ICD-10-CM | POA: Diagnosis not present

## 2022-05-08 DIAGNOSIS — I10 Essential (primary) hypertension: Secondary | ICD-10-CM | POA: Diagnosis not present

## 2022-05-08 DIAGNOSIS — R002 Palpitations: Secondary | ICD-10-CM | POA: Diagnosis not present

## 2022-05-08 DIAGNOSIS — R5383 Other fatigue: Secondary | ICD-10-CM | POA: Diagnosis not present

## 2022-05-22 DIAGNOSIS — R600 Localized edema: Secondary | ICD-10-CM | POA: Diagnosis not present

## 2022-05-22 DIAGNOSIS — I10 Essential (primary) hypertension: Secondary | ICD-10-CM | POA: Diagnosis not present

## 2022-05-22 DIAGNOSIS — R7303 Prediabetes: Secondary | ICD-10-CM | POA: Diagnosis not present

## 2022-05-22 DIAGNOSIS — E039 Hypothyroidism, unspecified: Secondary | ICD-10-CM | POA: Diagnosis not present

## 2022-05-22 DIAGNOSIS — E782 Mixed hyperlipidemia: Secondary | ICD-10-CM | POA: Diagnosis not present

## 2022-09-16 DIAGNOSIS — R0781 Pleurodynia: Secondary | ICD-10-CM | POA: Diagnosis not present

## 2022-09-16 DIAGNOSIS — F411 Generalized anxiety disorder: Secondary | ICD-10-CM | POA: Diagnosis not present

## 2022-09-16 DIAGNOSIS — M546 Pain in thoracic spine: Secondary | ICD-10-CM | POA: Diagnosis not present

## 2022-09-23 DIAGNOSIS — Z170 Estrogen receptor positive status [ER+]: Secondary | ICD-10-CM | POA: Diagnosis not present

## 2022-09-23 DIAGNOSIS — C50811 Malignant neoplasm of overlapping sites of right female breast: Secondary | ICD-10-CM | POA: Diagnosis not present

## 2022-09-23 DIAGNOSIS — C50911 Malignant neoplasm of unspecified site of right female breast: Secondary | ICD-10-CM | POA: Diagnosis not present

## 2022-11-06 DIAGNOSIS — R002 Palpitations: Secondary | ICD-10-CM | POA: Diagnosis not present

## 2022-11-06 DIAGNOSIS — E7800 Pure hypercholesterolemia, unspecified: Secondary | ICD-10-CM | POA: Diagnosis not present

## 2022-11-06 DIAGNOSIS — E782 Mixed hyperlipidemia: Secondary | ICD-10-CM | POA: Diagnosis not present

## 2022-11-06 DIAGNOSIS — I10 Essential (primary) hypertension: Secondary | ICD-10-CM | POA: Diagnosis not present

## 2022-11-11 ENCOUNTER — Emergency Department: Payer: Medicare HMO

## 2022-11-11 ENCOUNTER — Emergency Department
Admission: EM | Admit: 2022-11-11 | Discharge: 2022-11-12 | Disposition: A | Discharge status: Home / Self Care | Payer: Medicare HMO | Attending: Emergency Medicine | Admitting: Emergency Medicine

## 2022-11-11 ENCOUNTER — Other Ambulatory Visit: Payer: Self-pay

## 2022-11-11 DIAGNOSIS — Z20822 Contact with and (suspected) exposure to covid-19: Secondary | ICD-10-CM | POA: Diagnosis not present

## 2022-11-11 DIAGNOSIS — G43809 Other migraine, not intractable, without status migrainosus: Secondary | ICD-10-CM | POA: Diagnosis not present

## 2022-11-11 DIAGNOSIS — E039 Hypothyroidism, unspecified: Secondary | ICD-10-CM | POA: Diagnosis not present

## 2022-11-11 DIAGNOSIS — R519 Headache, unspecified: Secondary | ICD-10-CM | POA: Diagnosis not present

## 2022-11-11 DIAGNOSIS — I10 Essential (primary) hypertension: Secondary | ICD-10-CM | POA: Insufficient documentation

## 2022-11-11 DIAGNOSIS — M542 Cervicalgia: Secondary | ICD-10-CM | POA: Diagnosis not present

## 2022-11-11 LAB — COMPREHENSIVE METABOLIC PANEL
ALT: 26 U/L (ref 0–44)
AST: 27 U/L (ref 15–41)
Albumin: 4.2 g/dL (ref 3.5–5.0)
Alkaline Phosphatase: 73 U/L (ref 38–126)
Anion gap: 6 (ref 5–15)
BUN: 14 mg/dL (ref 8–23)
CO2: 25 mmol/L (ref 22–32)
Calcium: 9.1 mg/dL (ref 8.9–10.3)
Chloride: 109 mmol/L (ref 98–111)
Creatinine, Ser: 0.63 mg/dL (ref 0.44–1.00)
GFR, Estimated: >60 mL/min (ref >60)
Glucose, Bld: 107 mg/dL — ABNORMAL HIGH (ref 70–99)
Potassium: 4.1 mmol/L (ref 3.5–5.1)
Sodium: 140 mmol/L (ref 135–145)
Total Bilirubin: 0.6 mg/dL (ref 0.3–1.2)
Total Protein: 7.1 g/dL (ref 6.5–8.1)

## 2022-11-11 LAB — CBC WITH DIFFERENTIAL/PLATELET
Abs Immature Granulocytes: 0.02 10*3/uL (ref 0.00–0.07)
Basophils Absolute: 0 10*3/uL (ref 0.0–0.1)
Basophils Relative: 0 %
Eosinophils Absolute: 0.2 10*3/uL (ref 0.0–0.5)
Eosinophils Relative: 3 %
HCT: 41.8 % (ref 36.0–46.0)
Hemoglobin: 13.6 g/dL (ref 12.0–15.0)
Immature Granulocytes: 0 %
Lymphocytes Relative: 39 %
Lymphs Abs: 2.6 10*3/uL (ref 0.7–4.0)
MCH: 29.8 pg (ref 26.0–34.0)
MCHC: 32.5 g/dL (ref 30.0–36.0)
MCV: 91.7 fL (ref 80.0–100.0)
Monocytes Absolute: 0.4 10*3/uL (ref 0.1–1.0)
Monocytes Relative: 6 %
Neutro Abs: 3.4 10*3/uL (ref 1.7–7.7)
Neutrophils Relative %: 52 %
Platelets: 231 10*3/uL (ref 150–400)
RBC: 4.56 MIL/uL (ref 3.87–5.11)
RDW: 13.5 % (ref 11.5–15.5)
WBC: 6.7 10*3/uL (ref 4.0–10.5)
nRBC: 0 % (ref 0.0–0.2)

## 2022-11-11 LAB — URINALYSIS, ROUTINE W REFLEX MICROSCOPIC
Bilirubin Urine: NEGATIVE
Glucose, UA: NEGATIVE mg/dL
Ketones, ur: NEGATIVE mg/dL
Nitrite: NEGATIVE
Protein, ur: NEGATIVE mg/dL
Specific Gravity, Urine: 1.009 (ref 1.005–1.030)
pH: 6 (ref 5.0–8.0)

## 2022-11-11 LAB — APTT: aPTT: 25 seconds (ref 24–36)

## 2022-11-11 LAB — PROTIME-INR
INR: 1 (ref 0.8–1.2)
Prothrombin Time: 13.3 seconds (ref 11.4–15.2)

## 2022-11-11 LAB — TROPONIN I (HIGH SENSITIVITY)
Troponin I (High Sensitivity): 4 ng/L (ref <18)
Troponin I (High Sensitivity): 5 ng/L (ref <18)

## 2022-11-11 LAB — RESP PANEL BY RT-PCR (RSV, FLU A&B, COVID)  RVPGX2
Influenza A by PCR: NEGATIVE
Influenza B by PCR: NEGATIVE
Resp Syncytial Virus by PCR: NEGATIVE
SARS Coronavirus 2 by RT PCR: NEGATIVE

## 2022-11-11 LAB — CBG MONITORING, ED: Glucose-Capillary: 95 mg/dL (ref 70–99)

## 2022-11-11 MED ORDER — METOCLOPRAMIDE HCL 5 MG/ML IJ SOLN
10.0000 mg | Freq: Once | INTRAMUSCULAR | Status: AC
  Administered 2022-11-11: 10 mg via INTRAVENOUS
  Filled 2022-11-11: qty 2

## 2022-11-11 MED ORDER — MORPHINE SULFATE (PF) 4 MG/ML IV SOLN
4.0000 mg | Freq: Once | INTRAVENOUS | Status: AC
  Administered 2022-11-11: 4 mg via INTRAVENOUS
  Filled 2022-11-11: qty 1

## 2022-11-11 MED ORDER — LORAZEPAM 2 MG/ML IJ SOLN
0.5000 mg | Freq: Once | INTRAMUSCULAR | Status: AC
  Administered 2022-11-11: 0.5 mg via INTRAVENOUS
  Filled 2022-11-11: qty 1

## 2022-11-11 MED ORDER — CEPHALEXIN 500 MG PO CAPS: 500.0000 mg | ORAL_CAPSULE | Freq: Four times a day (QID) | ORAL | 0 refills | Status: AC

## 2022-11-11 MED ORDER — SODIUM CHLORIDE 0.9 % IV BOLUS
500.0000 mL | Freq: Once | INTRAVENOUS | Status: AC
  Administered 2022-11-11: 500 mL via INTRAVENOUS

## 2022-11-11 MED ORDER — METHOCARBAMOL 500 MG PO TABS: 500.0000 mg | ORAL_TABLET | Freq: Every day | ORAL | 0 refills | Status: AC

## 2022-11-11 MED ORDER — ONDANSETRON HCL 4 MG/2ML IJ SOLN
4.0000 mg | Freq: Once | INTRAMUSCULAR | Status: AC
  Administered 2022-11-11: 4 mg via INTRAVENOUS
  Filled 2022-11-11: qty 2

## 2022-11-11 MED ORDER — DIPHENHYDRAMINE HCL 50 MG/ML IJ SOLN
25.0000 mg | Freq: Once | INTRAMUSCULAR | Status: AC
  Administered 2022-11-11: 25 mg via INTRAVENOUS
  Filled 2022-11-11: qty 1

## 2022-11-11 MED ORDER — DEXAMETHASONE SODIUM PHOSPHATE 10 MG/ML IJ SOLN
10.0000 mg | Freq: Once | INTRAMUSCULAR | Status: AC
  Administered 2022-11-11: 10 mg via INTRAVENOUS
  Filled 2022-11-11: qty 1

## 2022-11-11 NOTE — ED Notes (Signed)
First Nurse Note: Pt to ED from [facility] in for headache x 4-5 day. Pt also having some nausea this morning. Pt does not appear to be in an distress.

## 2022-11-11 NOTE — ED Provider Notes (Signed)
[facility] Provider Note  Patient Contact: 3:29 PM (approximate)   History   Headache   HPI  Margaret Townsend is a 76 y.o. female with a history of hypertension, headaches, hypothyroidism, GERD and arthritis, presents to the emergency department with concern for weakness of the right upper extremity, dizziness, lightheadedness and occipital headache.  Patient states that she has noticed headache developing on Friday.  Patient states that she has a history of headaches but has never experienced pain like this.  She denies fever and chills.  No falls or other mechanisms of trauma.  Pain has not been improved with over-the-counter medications at home.  No chest pain, chest tightness or shortness of breath.  Triage Vital Signs: ED Triage Vitals  Enc Vitals Group     BP 11/11/22 1406 (!) 175/84     Pulse Rate 11/11/22 1406 78     Resp 11/11/22 1406 18     Temp 11/11/22 1406 98.8 F (37.1 C)     Temp Source 11/11/22 1406 Oral     SpO2 11/11/22 1406 96 %     Weight 11/11/22 1407 170 lb (77.1 kg)     Height 11/11/22 1407 '5\' 5"'$  (1.651 m)     Head Circumference --      Peak Flow --      Pain Score 11/11/22 1406 8     Pain Loc --      Pain Edu? --      Excl. in [place]? --     Most recent vital signs: Vitals:   11/11/22 1848 11/11/22 1958  BP: (!) 170/80 (!) 152/73  Pulse: 80 74  Resp: 18 16  Temp: 98 F (36.7 C) 98.4 F (36.9 C)  SpO2: 97% 99%     General: Alert and in no acute distress. Eyes:  PERRL. EOMI. Head: No acute traumatic findings ENT:      Nose: No congestion/rhinnorhea.      Mouth/Throat: Mucous membranes are moist. Neck: No stridor. No cervical spine tenderness to palpation. Cardiovascular:  Good peripheral perfusion Respiratory: Normal respiratory effort without tachypnea or retractions. Lungs CTAB. Good air entry to the bases with no decreased or absent breath sounds. Gastrointestinal: Bowel sounds 4 quadrants. Soft and nontender to  palpation. No guarding or rigidity. No palpable masses. No distention. No CVA tenderness. Musculoskeletal: Full range of motion to all extremities.  Patient has 3 out of 5 grip strength on the right and 5 out of 5 grip strength on the left.  Patient seems to shuffle when she ambulates.  Neurologic:  No gross focal neurologic deficits are appreciated.  Skin:   No rash noted Other:   ED Results / Procedures / Treatments   Labs (all labs ordered are listed, but only abnormal results are displayed) Labs Reviewed  COMPREHENSIVE METABOLIC PANEL - Abnormal; Notable for the following components:      Result Value   Glucose, Bld 107 (*)    All other components within normal limits  URINALYSIS, ROUTINE W REFLEX MICROSCOPIC - Abnormal; Notable for the following components:   Color, Urine YELLOW (*)    APPearance CLOUDY (*)    Hgb urine dipstick SMALL (*)    Leukocytes,Ua TRACE (*)    Bacteria, UA MANY (*)    All other components within normal limits  RESP PANEL BY RT-PCR (RSV, FLU A&B, COVID)  RVPGX2  CBC WITH DIFFERENTIAL/PLATELET  PROTIME-INR  APTT  CBG MONITORING, ED  TROPONIN I (HIGH SENSITIVITY)  TROPONIN  I (HIGH SENSITIVITY)        RADIOLOGY  I personally viewed and evaluated these images as part of my medical decision making, as well as reviewing the written report by the radiologist.  ED Provider Interpretation: No acute abnormality on CTs of the head and cervical spine.   PROCEDURES:  Critical Care performed: No  Procedures   MEDICATIONS ORDERED IN ED: Medications  morphine (PF) 4 MG/ML injection 4 mg (has no administration in time range)  ondansetron (ZOFRAN) injection 4 mg (has no administration in time range)  metoCLOPramide (REGLAN) injection 10 mg (10 mg Intravenous Given 11/11/22 1544)  sodium chloride 0.9 % bolus 500 mL (0 mLs Intravenous Stopped 11/11/22 1657)  diphenhydrAMINE (BENADRYL) injection 25 mg (25 mg Intravenous Given 11/11/22 1546)  LORazepam  (ATIVAN) injection 0.5 mg (0.5 mg Intravenous Given 11/11/22 1547)  ondansetron (ZOFRAN) injection 4 mg (4 mg Intravenous Given 11/11/22 1553)  dexamethasone (DECADRON) injection 10 mg (10 mg Intravenous Given 11/11/22 2216)     IMPRESSION / MDM / ASSESSMENT AND PLAN / ED COURSE  I reviewed the triage vital signs and the nursing notes.                              Assessment and plan:  Headache:  76 year old female presents to the emergency department with headache for the past several days along with subjective complaints of weakness and dizziness.  Patient was hypertensive at triage but vital signs otherwise reassuring.  Patient was able to provide historical details and seemed oriented without overt neurodeficits.  Patient did have shuffling gait and did seem dizzy while returning from the restroom.  Urinalysis was concerning for UTI with many bacteria.  CBC and CMP within range.  Troponin within range.  COVID and flu negative.  CT head and MRI brain unremarkable.  Patient did report some improvement with Decadron, normal saline bolus, Benadryl, Reglan and morphine.  Patient was discharged with a short course of Robaxin as she has muscle spasms along the paraspinal muscles of her cervical spine on the right.  Return precautions were given to return with new or worsening symptoms.  All patient questions were answered.     FINAL CLINICAL IMPRESSION(S) / ED DIAGNOSES   Final diagnoses:  Other migraine without status migrainosus, not intractable     Rx / DC Orders   ED Discharge Orders          Ordered    cephALEXin (KEFLEX) 500 MG capsule  4 times daily        11/11/22 2315    methocarbamol (ROBAXIN) 500 MG tablet  Daily        11/11/22 2323             Note:  This document was prepared using Dragon voice recognition software and may include unintentional dictation errors.   [name], PA-C 11/11/22 Janus Molder    Carrie Mew, MD 11/18/22 [phone]

## 2022-11-11 NOTE — Discharge Instructions (Addendum)
Take Keflex four times daily for the next seven days.  

## 2022-11-11 NOTE — ED Provider Triage Note (Signed)
Emergency Medicine Provider Triage Evaluation Note  Margaret Townsend , a 76 y.o. female  was evaluated in triage.  Pt complains of severe headache, been going on for several days, weakness on the right side, pain at the base of the skull and into her neck.  Review of Systems  Positive:  Negative:   Physical Exam  BP (!) 175/84 (BP Location: Left Arm)   Pulse 78   Temp 98.8 F (37.1 C) (Oral)   Resp 18   Ht '5\' 5"'$  (1.651 m)   Wt 77.1 kg   SpO2 96%   BMI 28.29 kg/m  Gen:   Awake, no distress   Resp:  Normal effort  MSK:   Moves extremities without difficulty  Other:    Medical Decision Making  Medically screening exam initiated at 2:10 PM.  Appropriate orders placed.  Margaret Townsend was informed that the remainder of the evaluation will be completed by another provider, this initial triage assessment does not replace that evaluation, and the importance of remaining in the ED until their evaluation is complete.  Patient has severe tenderness at the upper C-spine so we will also do a CT of the head along with C-spine to evaluate for occult fracture   Versie Starks, PA-C 11/11/22 1413

## 2022-11-11 NOTE — ED Triage Notes (Addendum)
Complains of headache x several days that goes into her neck.  Patient reports generalized weakness and feeling shaky all over. Complains of right side weakness and different sensation to right side of face and neck. .  Reports pain is at base of skull.

## 2022-11-11 NOTE — ED Notes (Signed)
See triage note  Presents with headache and neck pain  States pain started about 3-4 days ago  Denies any injury  No fever

## 2022-11-12 NOTE — ED Notes (Signed)
E signature pad not working. Pt educated on discharge instructions and verbalized understanding.  

## 2022-11-13 DIAGNOSIS — E782 Mixed hyperlipidemia: Secondary | ICD-10-CM | POA: Diagnosis not present

## 2022-11-13 DIAGNOSIS — I10 Essential (primary) hypertension: Secondary | ICD-10-CM | POA: Diagnosis not present

## 2022-11-13 DIAGNOSIS — R7303 Prediabetes: Secondary | ICD-10-CM | POA: Diagnosis not present

## 2022-11-20 DIAGNOSIS — R110 Nausea: Secondary | ICD-10-CM | POA: Diagnosis not present

## 2022-11-20 DIAGNOSIS — R519 Headache, unspecified: Secondary | ICD-10-CM | POA: Diagnosis not present

## 2022-11-28 DIAGNOSIS — R2689 Other abnormalities of gait and mobility: Secondary | ICD-10-CM | POA: Diagnosis not present

## 2022-11-28 DIAGNOSIS — M5030 Other cervical disc degeneration, unspecified cervical region: Secondary | ICD-10-CM | POA: Diagnosis not present

## 2022-11-28 DIAGNOSIS — M5481 Occipital neuralgia: Secondary | ICD-10-CM | POA: Diagnosis not present

## 2022-11-28 DIAGNOSIS — I10 Essential (primary) hypertension: Secondary | ICD-10-CM | POA: Diagnosis not present

## 2022-11-28 DIAGNOSIS — M7918 Myalgia, other site: Secondary | ICD-10-CM | POA: Diagnosis not present

## 2022-11-28 DIAGNOSIS — Z9011 Acquired absence of right breast and nipple: Secondary | ICD-10-CM | POA: Diagnosis not present

## 2022-12-04 DIAGNOSIS — M7918 Myalgia, other site: Secondary | ICD-10-CM | POA: Diagnosis not present

## 2022-12-04 DIAGNOSIS — M5481 Occipital neuralgia: Secondary | ICD-10-CM | POA: Diagnosis not present

## 2022-12-04 DIAGNOSIS — M542 Cervicalgia: Secondary | ICD-10-CM | POA: Diagnosis not present

## 2022-12-12 NOTE — Progress Notes (Signed)
Patient was being worked up for stroke and may have needed anticoagulant therapy which would require a pt/pttj.  Labs were ordered in triage during mse.

## 2022-12-21 DIAGNOSIS — Z20822 Contact with and (suspected) exposure to covid-19: Secondary | ICD-10-CM | POA: Diagnosis not present

## 2022-12-21 DIAGNOSIS — U071 COVID-19: Secondary | ICD-10-CM | POA: Diagnosis not present

## 2022-12-21 DIAGNOSIS — R059 Cough, unspecified: Secondary | ICD-10-CM | POA: Diagnosis not present

## 2022-12-21 DIAGNOSIS — R112 Nausea with vomiting, unspecified: Secondary | ICD-10-CM | POA: Diagnosis not present

## 2023-02-07 DIAGNOSIS — Z961 Presence of intraocular lens: Secondary | ICD-10-CM | POA: Diagnosis not present

## 2023-02-07 DIAGNOSIS — H43813 Vitreous degeneration, bilateral: Secondary | ICD-10-CM | POA: Diagnosis not present

## 2023-02-07 DIAGNOSIS — H353131 Nonexudative age-related macular degeneration, bilateral, early dry stage: Secondary | ICD-10-CM | POA: Diagnosis not present

## 2023-02-26 DIAGNOSIS — M5030 Other cervical disc degeneration, unspecified cervical region: Secondary | ICD-10-CM | POA: Diagnosis not present

## 2023-02-26 DIAGNOSIS — Z9011 Acquired absence of right breast and nipple: Secondary | ICD-10-CM | POA: Diagnosis not present

## 2023-02-26 DIAGNOSIS — M5481 Occipital neuralgia: Secondary | ICD-10-CM | POA: Diagnosis not present

## 2023-02-26 DIAGNOSIS — M7918 Myalgia, other site: Secondary | ICD-10-CM | POA: Diagnosis not present

## 2023-04-08 DIAGNOSIS — R251 Tremor, unspecified: Secondary | ICD-10-CM | POA: Diagnosis not present

## 2023-04-08 DIAGNOSIS — E538 Deficiency of other specified B group vitamins: Secondary | ICD-10-CM | POA: Diagnosis not present

## 2023-04-08 DIAGNOSIS — E559 Vitamin D deficiency, unspecified: Secondary | ICD-10-CM | POA: Diagnosis not present

## 2023-04-08 DIAGNOSIS — L659 Nonscarring hair loss, unspecified: Secondary | ICD-10-CM | POA: Diagnosis not present

## 2023-04-08 DIAGNOSIS — R5383 Other fatigue: Secondary | ICD-10-CM | POA: Diagnosis not present

## 2023-04-08 DIAGNOSIS — R079 Chest pain, unspecified: Secondary | ICD-10-CM | POA: Diagnosis not present

## 2023-04-10 DIAGNOSIS — C50811 Malignant neoplasm of overlapping sites of right female breast: Secondary | ICD-10-CM | POA: Diagnosis not present

## 2023-04-10 DIAGNOSIS — R5383 Other fatigue: Secondary | ICD-10-CM | POA: Diagnosis not present

## 2023-04-10 DIAGNOSIS — Z9010 Acquired absence of unspecified breast and nipple: Secondary | ICD-10-CM | POA: Diagnosis not present

## 2023-04-10 DIAGNOSIS — Z79811 Long term (current) use of aromatase inhibitors: Secondary | ICD-10-CM | POA: Diagnosis not present

## 2023-04-10 DIAGNOSIS — C50911 Malignant neoplasm of unspecified site of right female breast: Secondary | ICD-10-CM | POA: Diagnosis not present

## 2023-04-10 DIAGNOSIS — Z1231 Encounter for screening mammogram for malignant neoplasm of breast: Secondary | ICD-10-CM | POA: Diagnosis not present

## 2023-04-10 DIAGNOSIS — Z96652 Presence of left artificial knee joint: Secondary | ICD-10-CM | POA: Diagnosis not present

## 2023-04-10 DIAGNOSIS — Z170 Estrogen receptor positive status [ER+]: Secondary | ICD-10-CM | POA: Diagnosis not present

## 2023-04-10 DIAGNOSIS — Z08 Encounter for follow-up examination after completed treatment for malignant neoplasm: Secondary | ICD-10-CM | POA: Diagnosis not present

## 2023-04-10 DIAGNOSIS — M1712 Unilateral primary osteoarthritis, left knee: Secondary | ICD-10-CM | POA: Diagnosis not present

## 2023-04-10 DIAGNOSIS — I10 Essential (primary) hypertension: Secondary | ICD-10-CM | POA: Diagnosis not present

## 2023-04-10 DIAGNOSIS — Z853 Personal history of malignant neoplasm of breast: Secondary | ICD-10-CM | POA: Diagnosis not present

## 2023-05-07 DIAGNOSIS — K219 Gastro-esophageal reflux disease without esophagitis: Secondary | ICD-10-CM | POA: Diagnosis not present

## 2023-05-07 DIAGNOSIS — I10 Essential (primary) hypertension: Secondary | ICD-10-CM | POA: Diagnosis not present

## 2023-05-07 DIAGNOSIS — R0602 Shortness of breath: Secondary | ICD-10-CM | POA: Diagnosis not present

## 2023-05-07 DIAGNOSIS — E782 Mixed hyperlipidemia: Secondary | ICD-10-CM | POA: Diagnosis not present

## 2023-05-07 DIAGNOSIS — I2089 Other forms of angina pectoris: Secondary | ICD-10-CM | POA: Diagnosis not present

## 2023-05-08 ENCOUNTER — Other Ambulatory Visit: Payer: Self-pay | Admitting: Internal Medicine

## 2023-05-08 DIAGNOSIS — I2089 Other forms of angina pectoris: Secondary | ICD-10-CM

## 2023-05-20 DIAGNOSIS — E039 Hypothyroidism, unspecified: Secondary | ICD-10-CM | POA: Diagnosis not present

## 2023-06-03 DIAGNOSIS — E039 Hypothyroidism, unspecified: Secondary | ICD-10-CM | POA: Diagnosis not present

## 2023-06-03 DIAGNOSIS — R251 Tremor, unspecified: Secondary | ICD-10-CM | POA: Diagnosis not present

## 2023-06-03 DIAGNOSIS — R5383 Other fatigue: Secondary | ICD-10-CM | POA: Diagnosis not present

## 2023-06-03 DIAGNOSIS — R002 Palpitations: Secondary | ICD-10-CM | POA: Diagnosis not present

## 2023-06-03 DIAGNOSIS — F411 Generalized anxiety disorder: Secondary | ICD-10-CM | POA: Diagnosis not present

## 2023-06-04 ENCOUNTER — Encounter (HOSPITAL_COMMUNITY): Payer: Self-pay

## 2023-06-04 ENCOUNTER — Other Ambulatory Visit (HOSPITAL_COMMUNITY): Payer: Self-pay | Admitting: *Deleted

## 2023-06-04 MED ORDER — METOPROLOL TARTRATE 100 MG PO TABS: ORAL_TABLET | ORAL | 0 refills | Status: AC

## 2023-06-06 ENCOUNTER — Telehealth (HOSPITAL_COMMUNITY): Payer: Self-pay | Admitting: *Deleted

## 2023-06-06 NOTE — Telephone Encounter (Signed)
Reaching out to patient to offer assistance regarding upcoming cardiac imaging study; pt verbalizes understanding of appt date/time, parking situation and where to check in, pre-test NPO status and medications ordered, and verified current allergies; name and call back number provided for further questions should they arise  Larey Brick RN Navigator Cardiac Imaging [facility] [phone] office [phone] cell  Reviewed how to take 13 hour prep and metoprolol with patient. She verbalized understanding.

## 2023-06-09 ENCOUNTER — Other Ambulatory Visit: Payer: Self-pay | Admitting: Internal Medicine

## 2023-06-09 ENCOUNTER — Ambulatory Visit
Admission: RE | Admit: 2023-06-09 | Discharge: 2023-06-09 | Disposition: A | Discharge status: Home / Self Care | Payer: Medicare HMO | Source: Ambulatory Visit | Attending: Internal Medicine | Admitting: Internal Medicine

## 2023-06-09 ENCOUNTER — Ambulatory Visit
Admission: RE | Admit: 2023-06-09 | Discharge: 2023-06-09 | Disposition: A | Discharge status: Home / Self Care | Payer: Self-pay | Source: Ambulatory Visit | Attending: Internal Medicine | Admitting: Internal Medicine

## 2023-06-09 DIAGNOSIS — I2089 Other forms of angina pectoris: Secondary | ICD-10-CM

## 2023-06-09 MED ORDER — METOPROLOL TARTRATE 5 MG/5ML IV SOLN
INTRAVENOUS | Status: AC
  Filled 2023-06-09: qty 10

## 2023-06-09 MED ORDER — METOPROLOL TARTRATE 5 MG/5ML IV SOLN
10.0000 mg | Freq: Once | INTRAVENOUS | Status: AC
  Administered 2023-06-09: 10 mg via INTRAVENOUS
  Filled 2023-06-09: qty 10

## 2023-06-09 MED ORDER — NITROGLYCERIN 0.4 MG SL SUBL
SUBLINGUAL_TABLET | SUBLINGUAL | Status: AC
  Filled 2023-06-09: qty 2

## 2023-06-09 MED ORDER — DILTIAZEM HCL 25 MG/5ML IV SOLN
INTRAVENOUS | Status: AC
  Filled 2023-06-09: qty 5

## 2023-06-09 MED ORDER — NITROGLYCERIN 0.4 MG SL SUBL
0.8000 mg | SUBLINGUAL_TABLET | Freq: Once | SUBLINGUAL | Status: AC
  Administered 2023-06-09: 0.8 mg via SUBLINGUAL
  Filled 2023-06-09: qty 25

## 2023-06-09 MED ORDER — DILTIAZEM HCL 25 MG/5ML IV SOLN
5.0000 mg | Freq: Once | INTRAVENOUS | Status: AC
  Administered 2023-06-09: 5 mg via INTRAVENOUS
  Filled 2023-06-09: qty 5

## 2023-06-09 MED ORDER — IOHEXOL 350 MG/ML SOLN
80.0000 mL | Freq: Once | INTRAVENOUS | Status: AC | PRN
  Administered 2023-06-09: 80 mL via INTRAVENOUS

## 2023-06-09 NOTE — Progress Notes (Signed)
Patient tolerated procedure well. Ambulate w/o difficulty. Denies any lightheadedness or being dizzy. Pt denies any pain at this time. Sitting in chair, pt is encouraged to drink additional water throughout the day and reason explained to patient. Patient verbalized understanding and all questions answered. ABC intact. No further needs at this time. Discharge from procedure area w/o issues.  

## 2023-06-11 DIAGNOSIS — E782 Mixed hyperlipidemia: Secondary | ICD-10-CM | POA: Diagnosis not present

## 2023-06-11 DIAGNOSIS — E039 Hypothyroidism, unspecified: Secondary | ICD-10-CM | POA: Diagnosis not present

## 2023-06-11 DIAGNOSIS — R5383 Other fatigue: Secondary | ICD-10-CM | POA: Diagnosis not present

## 2023-06-11 DIAGNOSIS — E7800 Pure hypercholesterolemia, unspecified: Secondary | ICD-10-CM | POA: Diagnosis not present

## 2023-06-11 DIAGNOSIS — I10 Essential (primary) hypertension: Secondary | ICD-10-CM | POA: Diagnosis not present

## 2023-06-11 DIAGNOSIS — R0602 Shortness of breath: Secondary | ICD-10-CM | POA: Diagnosis not present

## 2023-06-11 DIAGNOSIS — K219 Gastro-esophageal reflux disease without esophagitis: Secondary | ICD-10-CM | POA: Diagnosis not present

## 2023-06-11 DIAGNOSIS — I2089 Other forms of angina pectoris: Secondary | ICD-10-CM | POA: Diagnosis not present

## 2023-06-11 DIAGNOSIS — R002 Palpitations: Secondary | ICD-10-CM | POA: Diagnosis not present

## 2023-06-13 DIAGNOSIS — R002 Palpitations: Secondary | ICD-10-CM | POA: Diagnosis not present

## 2023-07-01 DIAGNOSIS — E039 Hypothyroidism, unspecified: Secondary | ICD-10-CM | POA: Diagnosis not present

## 2023-07-01 DIAGNOSIS — I10 Essential (primary) hypertension: Secondary | ICD-10-CM | POA: Diagnosis not present

## 2023-07-01 DIAGNOSIS — R002 Palpitations: Secondary | ICD-10-CM | POA: Diagnosis not present

## 2023-07-01 DIAGNOSIS — I2089 Other forms of angina pectoris: Secondary | ICD-10-CM | POA: Diagnosis not present

## 2023-07-01 DIAGNOSIS — E782 Mixed hyperlipidemia: Secondary | ICD-10-CM | POA: Diagnosis not present

## 2023-07-01 DIAGNOSIS — R5383 Other fatigue: Secondary | ICD-10-CM | POA: Diagnosis not present

## 2023-07-01 DIAGNOSIS — E7800 Pure hypercholesterolemia, unspecified: Secondary | ICD-10-CM | POA: Diagnosis not present

## 2023-07-01 DIAGNOSIS — R0602 Shortness of breath: Secondary | ICD-10-CM | POA: Diagnosis not present

## 2023-07-01 DIAGNOSIS — K219 Gastro-esophageal reflux disease without esophagitis: Secondary | ICD-10-CM | POA: Diagnosis not present

## 2023-07-24 DIAGNOSIS — E039 Hypothyroidism, unspecified: Secondary | ICD-10-CM | POA: Diagnosis not present

## 2023-09-03 ENCOUNTER — Emergency Department: Payer: Medicare HMO

## 2023-09-03 ENCOUNTER — Other Ambulatory Visit: Payer: Self-pay

## 2023-09-03 ENCOUNTER — Emergency Department
Admission: EM | Admit: 2023-09-03 | Discharge: 2023-09-03 | Disposition: A | Discharge status: Home / Self Care | Payer: Medicare HMO | Attending: Emergency Medicine | Admitting: Emergency Medicine

## 2023-09-03 DIAGNOSIS — R531 Weakness: Secondary | ICD-10-CM | POA: Insufficient documentation

## 2023-09-03 DIAGNOSIS — S0990XA Unspecified injury of head, initial encounter: Secondary | ICD-10-CM | POA: Insufficient documentation

## 2023-09-03 DIAGNOSIS — R0602 Shortness of breath: Secondary | ICD-10-CM | POA: Diagnosis not present

## 2023-09-03 DIAGNOSIS — E039 Hypothyroidism, unspecified: Secondary | ICD-10-CM | POA: Insufficient documentation

## 2023-09-03 DIAGNOSIS — W19XXXA Unspecified fall, initial encounter: Secondary | ICD-10-CM | POA: Diagnosis not present

## 2023-09-03 DIAGNOSIS — I6782 Cerebral ischemia: Secondary | ICD-10-CM | POA: Diagnosis not present

## 2023-09-03 DIAGNOSIS — I672 Cerebral atherosclerosis: Secondary | ICD-10-CM | POA: Diagnosis not present

## 2023-09-03 DIAGNOSIS — R55 Syncope and collapse: Secondary | ICD-10-CM | POA: Diagnosis not present

## 2023-09-03 DIAGNOSIS — I10 Essential (primary) hypertension: Secondary | ICD-10-CM | POA: Insufficient documentation

## 2023-09-03 DIAGNOSIS — Z853 Personal history of malignant neoplasm of breast: Secondary | ICD-10-CM | POA: Diagnosis not present

## 2023-09-03 LAB — BASIC METABOLIC PANEL
Anion gap: 7 (ref 5–15)
BUN: 18 mg/dL (ref 8–23)
CO2: 25 mmol/L (ref 22–32)
Calcium: 9 mg/dL (ref 8.9–10.3)
Chloride: 106 mmol/L (ref 98–111)
Creatinine, Ser: 0.8 mg/dL (ref 0.44–1.00)
GFR, Estimated: >60 mL/min (ref >60)
Glucose, Bld: 153 mg/dL — ABNORMAL HIGH (ref 70–99)
Potassium: 3.7 mmol/L (ref 3.5–5.1)
Sodium: 138 mmol/L (ref 135–145)

## 2023-09-03 LAB — URINALYSIS, ROUTINE W REFLEX MICROSCOPIC
Bilirubin Urine: NEGATIVE
Glucose, UA: NEGATIVE mg/dL
Hgb urine dipstick: NEGATIVE
Ketones, ur: NEGATIVE mg/dL
Leukocytes,Ua: NEGATIVE
Nitrite: NEGATIVE
Protein, ur: NEGATIVE mg/dL
Specific Gravity, Urine: 1.006 (ref 1.005–1.030)
pH: 6 (ref 5.0–8.0)

## 2023-09-03 LAB — CBC
HCT: 42.7 % (ref 36.0–46.0)
Hemoglobin: 14.1 g/dL (ref 12.0–15.0)
MCH: 30.7 pg (ref 26.0–34.0)
MCHC: 33 g/dL (ref 30.0–36.0)
MCV: 93 fL (ref 80.0–100.0)
Platelets: 229 10*3/uL (ref 150–400)
RBC: 4.59 MIL/uL (ref 3.87–5.11)
RDW: 13.2 % (ref 11.5–15.5)
WBC: 7.7 10*3/uL (ref 4.0–10.5)
nRBC: 0 % (ref 0.0–0.2)

## 2023-09-03 LAB — TROPONIN I (HIGH SENSITIVITY)
Troponin I (High Sensitivity): 4 ng/L (ref <18)
Troponin I (High Sensitivity): 4 ng/L (ref <18)

## 2023-09-03 LAB — T4, FREE: Free T4: 0.93 ng/dL (ref 0.61–1.12)

## 2023-09-03 LAB — TSH: TSH: 3.406 u[IU]/mL (ref 0.350–4.500)

## 2023-09-03 MED ORDER — HYDROCHLOROTHIAZIDE 12.5 MG PO TABS: 12.5000 mg | ORAL_TABLET | Freq: Once | ORAL | Status: DC

## 2023-09-03 MED ORDER — CLONIDINE HCL 0.1 MG PO TABS
0.1000 mg | ORAL_TABLET | Freq: Once | ORAL | Status: AC
  Administered 2023-09-03: 0.1 mg via ORAL
  Filled 2023-09-03: qty 1

## 2023-09-03 MED ORDER — AMLODIPINE BESYLATE 5 MG PO TABS
5.0000 mg | ORAL_TABLET | Freq: Once | ORAL | Status: AC
  Administered 2023-09-03: 5 mg via ORAL
  Filled 2023-09-03: qty 1

## 2023-09-03 MED ORDER — LOSARTAN POTASSIUM 50 MG PO TABS
100.0000 mg | ORAL_TABLET | Freq: Once | ORAL | Status: AC
  Administered 2023-09-03: 100 mg via ORAL
  Filled 2023-09-03: qty 2

## 2023-09-03 NOTE — Discharge Instructions (Signed)
For your blood pressure:  START taking Losartan 100 mg (2 50 mg tablets) instead of 50 mg  Take your other medications as prescribed  Record your BP twice a day and follow-up with your primary in 1 week

## 2023-09-03 NOTE — ED Provider Notes (Signed)
[facility] Provider Note    Event Date/Time   First MD Initiated Contact with Patient 09/03/23 1533     (approximate)   History   Fall   HPI  Margaret Townsend is a 77 y.o. female  with h/o HTN, breast CA, hyperthyroidism, here with generalized weakness, syncopal episode. Pt reports that she has been generally weak over the last few days. She feels like every time she gets up to start move her blood pressure increases and she feels "very weak" like she has been exercising, has had decreased exercise tolerance and general fatigue. No known triggers - no recent med changes, diet change. Earlier today, she was walking in to a class in the hospital today when she saw her friend. She then doesn't remember what happened but woke up on the ground. She believes she briefly lost consciousness. She does not remember tripping. Since then, she has felt weak and went home to note a BP of 170s/80s, which is much higher than her usual. She subsequently presents for evaluation. No vision changes, focal numbness or weakness.       Physical Exam   Triage Vital Signs: ED Triage Vitals  Encounter Vitals Group     BP 09/03/23 1404 (!) 156/78     Systolic BP Percentile --      Diastolic BP Percentile --      Pulse Rate 09/03/23 1404 73     Resp 09/03/23 1404 17     Temp 09/03/23 1404 98.2 F (36.8 C)     Temp Source 09/03/23 1404 Oral     SpO2 09/03/23 1404 98 %     Weight 09/03/23 1405 163 lb (73.9 kg)     Height 09/03/23 1405 5\' 5"  (1.651 m)     Head Circumference --      Peak Flow --      Pain Score 09/03/23 1404 4     Pain Loc --      Pain Education --      Exclude from Growth Chart --     Most recent vital signs: Vitals:   09/03/23 1900 09/03/23 2012  BP: (!) 171/78 138/76  Pulse: 68 78  Resp: 13 10  Temp:    SpO2: 99% 96%     General: Awake, no distress.  CV:  Good peripheral perfusion. RRR. Resp:  Normal work of breathing. Lungs clear  bilaterally Abd:  No distention. No tenderness. No guarding or rebound. Other:  Anxious. CNII-XII intact. Strength 5/5 bl UE and LE. Normal sensation to light touch bl UE and LE. No dysmetria. Normal FTN   ED Results / Procedures / Treatments   Labs (all labs ordered are listed, but only abnormal results are displayed) Labs Reviewed  BASIC METABOLIC PANEL - Abnormal; Notable for the following components:      Result Value   Glucose, Bld 153 (*)    All other components within normal limits  URINALYSIS, ROUTINE W REFLEX MICROSCOPIC - Abnormal; Notable for the following components:   Color, Urine STRAW (*)    APPearance CLEAR (*)    All other components within normal limits  CBC  TSH  T4, FREE  CBG MONITORING, ED  TROPONIN I (HIGH SENSITIVITY)  TROPONIN I (HIGH SENSITIVITY)     EKG Normal sinus rhythm, VR 79. PR 174, QRS 80, QTc 433. No acute ST elevations or depressions. No ischemia or infarct.   RADIOLOGY CT Head: Negative CXR: Clear   I also independently  reviewed and agree with radiologist interpretations.   PROCEDURES:  Critical Care performed: No  .1-3 Lead EKG Interpretation  Performed by: Shaune Pollack, MD Authorized by: Shaune Pollack, MD     Interpretation: normal     ECG rate:  80-90   ECG rate assessment: normal     Rhythm: sinus rhythm     Ectopy: none     Conduction: normal   Comments:     Indication: Weakness     MEDICATIONS ORDERED IN ED: Medications  amLODipine (NORVASC) tablet 5 mg (5 mg Oral Given 09/03/23 1734)  losartan (COZAAR) tablet 100 mg (100 mg Oral Given 09/03/23 1825)  cloNIDine (CATAPRES) tablet 0.1 mg (0.1 mg Oral Given 09/03/23 1916)     IMPRESSION / MDM / ASSESSMENT AND PLAN / ED COURSE  I reviewed the triage vital signs and the nursing notes.                              Differential diagnosis includes, but is not limited to, symptomatic HTN, anxiety, generalized weakness from anemia, dehydration, ACS, AKI,  medication effect  Patient's presentation is most consistent with acute presentation with potential threat to life or bodily function.  The patient is on the cardiac monitor to evaluate for evidence of arrhythmia and/or significant heart rate changes  77 yo F here with hypertension, generalized weakness. On exam, she is overall well appearing. No focal neuro deficits. CT head is negative.  Her labs are very reassuring with normal CBC, renal function, negative trop. EKG nonischemic. UA negative. TSH/free T4 better than her prior values.   Pt seems to be very symptomatic with increase in BP - unclear if this is 2/2 anxiety, stressors, or worsening HTN. We discussed medication options, and will increase her losartan from 50 to 100 mg. Otherwise, she will f/u with her PCP. Return precautions given.   FINAL CLINICAL IMPRESSION(S) / ED DIAGNOSES   Final diagnoses:  Fall, initial encounter  Primary hypertension     Rx / DC Orders   ED Discharge Orders     None        Note:  This document was prepared using Dragon voice recognition software and may include unintentional dictation errors.   Shaune Pollack, MD 09/04/23 [phone]

## 2023-09-03 NOTE — ED Notes (Signed)
CT to bedside. Taking the pt to CT

## 2023-09-03 NOTE — ED Triage Notes (Signed)
Pt arrives via POV from the [facility] urgent care. Pt was a rapid response earlier this AM here at [facility] but wanted to go home. Since than pt has been feeling weak with her BP going high and blurred vision. Pt is shaking. Pt is A/Ox4, NAD at this time.

## 2023-09-09 DIAGNOSIS — F411 Generalized anxiety disorder: Secondary | ICD-10-CM | POA: Diagnosis not present

## 2023-09-09 DIAGNOSIS — E538 Deficiency of other specified B group vitamins: Secondary | ICD-10-CM | POA: Diagnosis not present

## 2023-09-09 DIAGNOSIS — R55 Syncope and collapse: Secondary | ICD-10-CM | POA: Diagnosis not present

## 2023-09-10 DIAGNOSIS — E039 Hypothyroidism, unspecified: Secondary | ICD-10-CM | POA: Diagnosis not present

## 2023-10-01 DIAGNOSIS — I6522 Occlusion and stenosis of left carotid artery: Secondary | ICD-10-CM | POA: Diagnosis not present

## 2023-10-01 DIAGNOSIS — R55 Syncope and collapse: Secondary | ICD-10-CM | POA: Diagnosis not present

## 2023-10-08 DIAGNOSIS — E538 Deficiency of other specified B group vitamins: Secondary | ICD-10-CM | POA: Diagnosis not present

## 2023-10-08 DIAGNOSIS — R55 Syncope and collapse: Secondary | ICD-10-CM | POA: Diagnosis not present

## 2023-10-08 DIAGNOSIS — R7303 Prediabetes: Secondary | ICD-10-CM | POA: Diagnosis not present

## 2023-10-08 DIAGNOSIS — E039 Hypothyroidism, unspecified: Secondary | ICD-10-CM | POA: Diagnosis not present

## 2023-10-08 DIAGNOSIS — H539 Unspecified visual disturbance: Secondary | ICD-10-CM | POA: Diagnosis not present

## 2023-10-08 DIAGNOSIS — R531 Weakness: Secondary | ICD-10-CM | POA: Diagnosis not present

## 2023-10-14 DIAGNOSIS — F411 Generalized anxiety disorder: Secondary | ICD-10-CM | POA: Diagnosis not present

## 2023-10-14 DIAGNOSIS — E782 Mixed hyperlipidemia: Secondary | ICD-10-CM | POA: Diagnosis not present

## 2023-10-14 DIAGNOSIS — R519 Headache, unspecified: Secondary | ICD-10-CM | POA: Diagnosis not present

## 2023-10-14 DIAGNOSIS — H539 Unspecified visual disturbance: Secondary | ICD-10-CM | POA: Diagnosis not present

## 2023-10-14 DIAGNOSIS — R7303 Prediabetes: Secondary | ICD-10-CM | POA: Diagnosis not present

## 2023-10-14 DIAGNOSIS — E039 Hypothyroidism, unspecified: Secondary | ICD-10-CM | POA: Diagnosis not present

## 2023-10-14 DIAGNOSIS — R55 Syncope and collapse: Secondary | ICD-10-CM | POA: Diagnosis not present

## 2023-10-14 DIAGNOSIS — R531 Weakness: Secondary | ICD-10-CM | POA: Diagnosis not present

## 2023-10-22 DIAGNOSIS — M7918 Myalgia, other site: Secondary | ICD-10-CM | POA: Diagnosis not present

## 2023-10-22 DIAGNOSIS — M2560 Stiffness of unspecified joint, not elsewhere classified: Secondary | ICD-10-CM | POA: Diagnosis not present

## 2023-10-22 DIAGNOSIS — M5481 Occipital neuralgia: Secondary | ICD-10-CM | POA: Diagnosis not present

## 2023-10-22 DIAGNOSIS — M5030 Other cervical disc degeneration, unspecified cervical region: Secondary | ICD-10-CM | POA: Diagnosis not present

## 2023-10-28 ENCOUNTER — Ambulatory Visit (INDEPENDENT_AMBULATORY_CARE_PROVIDER_SITE_OTHER): Payer: Medicare HMO | Admitting: Nurse Practitioner

## 2023-10-28 ENCOUNTER — Encounter (INDEPENDENT_AMBULATORY_CARE_PROVIDER_SITE_OTHER): Payer: Self-pay | Admitting: Nurse Practitioner

## 2023-10-28 VITALS — BP 118/69 | HR 71 | Resp 16 | Wt 166.0 lb

## 2023-10-28 DIAGNOSIS — E782 Mixed hyperlipidemia: Secondary | ICD-10-CM | POA: Diagnosis not present

## 2023-10-28 DIAGNOSIS — I10 Essential (primary) hypertension: Secondary | ICD-10-CM | POA: Diagnosis not present

## 2023-10-28 DIAGNOSIS — I6522 Occlusion and stenosis of left carotid artery: Secondary | ICD-10-CM | POA: Diagnosis not present

## 2023-10-28 NOTE — Progress Notes (Signed)
Subjective:    Patient ID: Margaret Townsend, female    DOB: Dec 15, 1945, 77 y.o.   MRN: [id number] Chief Complaint  Patient presents with   New Patient (Initial Visit)    Ref Margaret Townsend consult left carotid artery stenosis and occlusion    Margaret Townsend is a 77 year old female who presents today for evaluation of carotid stenosis.  The patient has symptoms where she feels lightheaded almost an extreme at times.  She has had a recent syncopal event about 3 weeks ago while exercising and it was noted at the time that her BP was significantly high.  Additionally she endorses having blurry vision at times with weakness all over.  She has also been having episodes of fatigue and the symptoms have been ongoing for a year but they have recently been getting worse.  Her daughter also notes that she has been getting shortness of breath with activity as well.  She also recently has begun to have some tremors as well.  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  She is hopeful that she will be able to receive injections soon to try to see if this will help with some of her headaches.  In evaluation for some of her symptoms she had an MRI and CT which showed no evidence of stroke.  She also had a carotid artery duplex which notes that there is no significant plaque in right ICA however the left ICA has about a 50% stenosis by Doppler criteria however the plaque is bulky and eccentric and given some of the measurements there is concern that it is actually closer to 70% or more.    Review of Systems  Constitutional:  Positive for fatigue.  Musculoskeletal:  Positive for neck pain.  Neurological:  Positive for tremors, weakness, light-headedness and headaches.       Objective:   Physical Exam Vitals reviewed.  HENT:     Head: Normocephalic.  Neck:     Vascular: No carotid bruit.  Cardiovascular:     Rate and Rhythm: Normal rate and regular rhythm.     Pulses: Normal pulses.   Pulmonary:     Effort: Pulmonary effort is normal.  Musculoskeletal:     Cervical back: Neck supple.  Skin:    General: Skin is warm.  Neurological:     Mental Status: She is alert and oriented to person, place, and time.     Gait: Gait abnormal.  Psychiatric:        Mood and Affect: Mood normal.        Behavior: Behavior normal.        Thought Content: Thought content normal.        Judgment: Judgment normal.     BP 118/69 (BP Location: Left Arm)   Pulse 71   Resp 16   Wt 166 lb (75.3 kg)   BMI 27.62 kg/m   Past Medical History:  Diagnosis Date   Arthritis    Breast cancer (HCC)    Cancer (HCC)    breast   GERD (gastroesophageal reflux disease)    Headache    Hypertension    Hypothyroidism    PONV (postoperative nausea and vomiting)    Thyroid disease     Social History   Socioeconomic History   Marital status: Married    Spouse name: Not on file   Number of children: 2   Years of education: Not on file   Highest education level: High school  graduate  Occupational History   Not on file  Tobacco Use   Smoking status: Never   Smokeless tobacco: Never  Vaping Use   Vaping status: Never Used  Substance and Sexual Activity   Alcohol use: Never   Drug use: Never   Sexual activity: Not Currently  Other Topics Concern   Not on file  Social History Narrative   Not on file   Social Determinants of Health   Financial Resource Strain: Low Risk  (10/14/2023)   Received from [facility]   Overall Financial Resource Strain (CARDIA)    Difficulty of Paying Living Expenses: Not hard at all  Food Insecurity: No Food Insecurity (10/14/2023)   Received from [facility]   Hunger Vital Sign    Worried About Running Out of Food in the Last Year: Never true    Ran Out of Food in the Last Year: Never true  Transportation Needs: No Transportation Needs (10/14/2023)   Received from [facility] -  Transportation    In the past 12 months, has lack of transportation kept you from medical appointments or from getting medications?: No    Lack of Transportation (Non-Medical): No  Physical Activity: Insufficiently Active (09/09/2018)   Exercise Vital Sign    Days of Exercise per Week: 4 days    Minutes of Exercise per Session: 30 min  Stress: Not on file  Social Connections: Not on file  Intimate Partner Violence: Not on file    Past Surgical History:  Procedure Laterality Date   ABDOMINAL HYSTERECTOMY     BACK SURGERY  2001   Lumbar   BREAST SURGERY     CATARACT EXTRACTION W/PHACO Left 05/06/2018   Procedure: CATARACT EXTRACTION PHACO AND INTRAOCULAR LENS PLACEMENT (IOC) LEFT;  Surgeon: Lockie Mola, MD;  Location: [facility];  Service: Ophthalmology;  Laterality: Left;  prefers to arrive around 9 am   CATARACT EXTRACTION W/PHACO Right 05/20/2018   Procedure: CATARACT EXTRACTION PHACO AND INTRAOCULAR LENS PLACEMENT (IOC) RIGHT  TORIC;  Surgeon: Lockie Mola, MD;  Location: [facility];  Service: Ophthalmology;  Laterality: Right;   CHOLECYSTECTOMY     DILATION AND CURETTAGE OF UTERUS     KNEE ARTHROPLASTY Left 12/01/2019   Procedure: COMPUTER ASSISTED TOTAL KNEE ARTHROPLASTY;  Surgeon: Donato Heinz, MD;  Location: [facility];  Service: Orthopedics;  Laterality: Left;   KNEE SURGERY Left 1976   MASTECTOMY Right 2015   breast cancer only treated surgically and with Letrozole   TONSILLECTOMY      Family History  Problem Relation Age of Onset   Stroke Mother 59   Ovarian cancer Mother    Pemphigus vulgaris Father    Aneurysm Sister        Brain   Hyperlipidemia Daughter    Thyroid disease Daughter 70    Allergies  Allergen Reactions   Codeine Shortness Of Breath   Oxycodone-Acetaminophen Shortness Of Breath, Nausea And Vomiting and Rash   Propoxyphene Shortness Of Breath, Nausea And Vomiting and Rash   Sulfamethoxazole-Trimethoprim Hives and  Nausea Only   Albuterol Other (See Comments)    Passed out, increased bp   Demerol [Meperidine] Nausea And Vomiting   Dilaudid [Hydromorphone Hcl] Nausea And Vomiting   Iodinated Contrast Media Rash and Other (See Comments)    Passed out = this was some time ago and patient thinks she had it again without difficulty Passed out = this was some time ago  and patient thinks she had it again without difficulty    Morphine And Codeine Nausea And Vomiting   Other Nausea And Vomiting   Sulfa Antibiotics Hives   Lisinopril-Hydrochlorothiazide Nausea Only and Swelling    Tongue swelling   Amoxicillin-Pot Clavulanate Nausea Only   Hydrocodone-Acetaminophen Rash   Levofloxacin Nausea Only   Promethazine Nausea And Vomiting   Tramadol Nausea And Vomiting    Patient states that she tolerates       Latest Ref Rng & Units 09/03/2023    2:07 PM 11/11/2022    2:18 PM 12/04/2019   10:58 AM  CBC  WBC 4.0 - 10.5 K/uL 7.7  6.7  9.6   Hemoglobin 12.0 - 15.0 g/dL 16.1  09.6  04.5   Hematocrit 36.0 - 46.0 % 42.7  41.8  33.0   Platelets 150 - 400 K/uL 229  231  191       CMP     Component Value Date/Time   NA 138 09/03/2023 1407   K 3.7 09/03/2023 1407   CL 106 09/03/2023 1407   CO2 25 09/03/2023 1407   GLUCOSE 153 (H) 09/03/2023 1407   BUN 18 09/03/2023 1407   CREATININE 0.80 09/03/2023 1407   CREATININE 0.82 07/30/2019 0850   CALCIUM 9.0 09/03/2023 1407   PROT 7.1 11/11/2022 1418   ALBUMIN 4.2 11/11/2022 1418   AST 27 11/11/2022 1418   ALT 26 11/11/2022 1418   ALKPHOS 73 11/11/2022 1418   BILITOT 0.6 11/11/2022 1418   GFRNONAA >60 09/03/2023 1407   GFRNONAA 72 07/30/2019 0850     No results found.     Assessment & Plan:   1. Stenosis of left carotid artery The patient has a number of neurological issues and I believe that some of it is related to her occipital neuralgia.  However her syncopal episodes in addition to her lightheadedness are certainly concerning for worsening  carotid stenosis.  The ultrasound indicates 50% stenosis but it is noted that based on some of the grayscale measurements the plaque could be approaching 70%.  Given her symptoms I think would be prudent for [place] to move forward with CT angiogram to evaluate closely. - CT ANGIO NECK W OR WO CONTRAST; Future  2. Essential (primary) hypertension Continue antihypertensive medications as already ordered, these medications have been reviewed and there are no changes at this time.  3. Mixed hyperlipidemia Continue statin as ordered and reviewed, no changes at this time   Current Outpatient Medications on File Prior to Visit  Medication Sig Dispense Refill   acetaminophen (TYLENOL) 500 MG tablet Take 250 mg by mouth every 6 (six) hours as needed (for pain).     amLODipine (NORVASC) 5 MG tablet Take 1 tablet by mouth daily.     aspirin EC 81 MG tablet Take 81 mg by mouth daily.     Cholecalciferol (VITAMIN D3) 50 MCG (2000 UT) TABS Take 2,000 Units by mouth daily in the afternoon.     diphenhydramine-acetaminophen (TYLENOL PM) 25-500 MG TABS tablet Take 0.5 tablets by mouth at bedtime as needed (pain.).      famotidine (PEPCID) 20 MG tablet Take 1 tablet (20 mg total) by mouth 2 (two) times daily as needed for heartburn or indigestion. 180 tablet 1   gabapentin (NEURONTIN) 100 MG capsule Take 1 capsule by mouth at bedtime.     Green Tea Oil Fragrance OIL Take 1 drop by mouth daily.      letrozole (FEMARA) 2.5 MG tablet  Take 2.5 mg by mouth daily before breakfast.      levothyroxine (SYNTHROID) 112 MCG tablet Take 1 tablet (112 mcg total) by mouth daily before breakfast. 30 tablet 5   losartan-hydrochlorothiazide (HYZAAR) 50-12.5 MG tablet Take 1 tablet by mouth daily. 90 tablet 3   metoprolol tartrate (LOPRESSOR) 100 MG tablet Take tablet (100mg ) TWO hours prior to your cardiac CT scan. 1 tablet 0   Multiple Vitamin (MULTIVITAMIN WITH MINERALS) TABS tablet Take 1 tablet by mouth daily in the afternoon.      omeprazole (PRILOSEC) 20 MG capsule Take 20 mg by mouth at bedtime.     rosuvastatin (CRESTOR) 5 MG tablet Take 1 tablet (5 mg total) by mouth daily. (Patient taking differently: Take 5 mg by mouth every other day. At night) 90 tablet 3   vitamin B-12 (CYANOCOBALAMIN) 1000 MCG tablet Take 1,000 mcg by mouth daily in the afternoon.      vitamin E 400 UNIT capsule Take 1 capsule by mouth daily.     enoxaparin (LOVENOX) 40 MG/0.4ML injection Inject 0.4 mLs (40 mg total) into the skin daily. (Patient not taking: Reported on 10/28/2023) 5.6 mL 0   oxyCODONE (ROXICODONE) 5 MG immediate release tablet Take 1-2 tablets (5-10 mg total) by mouth every 4 (four) hours as needed for severe pain. (Patient not taking: Reported on 10/28/2023) 30 tablet 0   traMADol (ULTRAM) 50 MG tablet Take 1 tablet (50 mg total) by mouth every 6 (six) hours as needed for moderate pain. (Patient not taking: Reported on 10/28/2023) 30 tablet 0   No current facility-administered medications on file prior to visit.    There are no Patient Instructions on file for this visit. No follow-ups on file.   Georgiana Spinner, NP

## 2023-11-03 ENCOUNTER — Telehealth (INDEPENDENT_AMBULATORY_CARE_PROVIDER_SITE_OTHER): Payer: Self-pay | Admitting: Nurse Practitioner

## 2023-11-03 DIAGNOSIS — M5481 Occipital neuralgia: Secondary | ICD-10-CM | POA: Diagnosis not present

## 2023-11-03 DIAGNOSIS — M7918 Myalgia, other site: Secondary | ICD-10-CM | POA: Diagnosis not present

## 2023-11-03 NOTE — Telephone Encounter (Signed)
Contrast protocol has been called into pharmacy and patient was notified

## 2023-11-03 NOTE — Telephone Encounter (Signed)
Spoke with patient to let her know CT has been authorized. Advised to call radiology scheduling and make appt for CT. Advised to call back to [facility] to make CT results appt with Dr. Wyn Quaker. Patient has allergy to contrast and will need pre meds called into [facility] in [place].

## 2023-11-04 ENCOUNTER — Ambulatory Visit
Admission: RE | Admit: 2023-11-04 | Discharge: 2023-11-04 | Disposition: A | Discharge status: Home / Self Care | Payer: Medicare HMO | Source: Ambulatory Visit | Attending: Nurse Practitioner | Admitting: Nurse Practitioner

## 2023-11-04 ENCOUNTER — Ambulatory Visit (INDEPENDENT_AMBULATORY_CARE_PROVIDER_SITE_OTHER): Payer: Medicare HMO | Admitting: Vascular Surgery

## 2023-11-04 VITALS — BP 150/81 | HR 91 | Resp 18 | Ht 66.0 in | Wt 167.8 lb

## 2023-11-04 DIAGNOSIS — I6523 Occlusion and stenosis of bilateral carotid arteries: Secondary | ICD-10-CM

## 2023-11-04 DIAGNOSIS — I6522 Occlusion and stenosis of left carotid artery: Secondary | ICD-10-CM | POA: Diagnosis not present

## 2023-11-04 DIAGNOSIS — I10 Essential (primary) hypertension: Secondary | ICD-10-CM | POA: Diagnosis not present

## 2023-11-04 DIAGNOSIS — E782 Mixed hyperlipidemia: Secondary | ICD-10-CM

## 2023-11-04 DIAGNOSIS — I6529 Occlusion and stenosis of unspecified carotid artery: Secondary | ICD-10-CM | POA: Insufficient documentation

## 2023-11-04 DIAGNOSIS — I700 Atherosclerosis of aorta: Secondary | ICD-10-CM | POA: Diagnosis not present

## 2023-11-04 DIAGNOSIS — I6509 Occlusion and stenosis of unspecified vertebral artery: Secondary | ICD-10-CM | POA: Diagnosis not present

## 2023-11-04 MED ORDER — IOHEXOL 350 MG/ML SOLN
75.0000 mL | Freq: Once | INTRAVENOUS | Status: AC | PRN
  Administered 2023-11-04: 75 mL via INTRAVENOUS

## 2023-11-04 NOTE — Progress Notes (Signed)
MRN : [id number]  Margaret Townsend is a 77 y.o. (10/28/1946) female who presents with chief complaint of  Chief Complaint  Patient presents with   Follow-up    Stat CT results  .  History of Present Illness: Patient returns today in follow up of her carotid disease.  She continues to have some dizziness and swimmy headedness.  She has not had any syncopal episodes or near syncope since her last visit.  No focal symptoms of arm or leg weakness or numbness, speech or swallowing difficulty, or temporary monocular blindness.  I have independently reviewed her CT angiogram which she has undergone.  The official report is of no significant right carotid stenosis and a 40% left carotid artery stenosis.  Insignificant intracranial disease may have been present as well.  I would generally agree with this report and would estimate the degree of left carotid stenosis in the 40 to 50% range.  This is somewhat less than her outside ultrasound which suggested a 50% stenosis and may be more but was very vague in its interpretation.  Current Outpatient Medications  Medication Sig Dispense Refill   acetaminophen (TYLENOL) 500 MG tablet Take 250 mg by mouth every 6 (six) hours as needed (for pain).     amLODipine (NORVASC) 5 MG tablet Take 1 tablet by mouth daily.     aspirin EC 81 MG tablet Take 81 mg by mouth daily.     Cholecalciferol (VITAMIN D3) 50 MCG (2000 UT) TABS Take 2,000 Units by mouth daily in the afternoon.     diphenhydramine-acetaminophen (TYLENOL PM) 25-500 MG TABS tablet Take 0.5 tablets by mouth at bedtime as needed (pain.).      famotidine (PEPCID) 20 MG tablet Take 1 tablet (20 mg total) by mouth 2 (two) times daily as needed for heartburn or indigestion. 180 tablet 1   gabapentin (NEURONTIN) 100 MG capsule Take 1 capsule by mouth at bedtime.     Green Tea Oil Fragrance OIL Take 1 drop by mouth daily.      hydrochlorothiazide (HYDRODIURIL) 25 MG tablet      letrozole (FEMARA) 2.5 MG tablet  Take 2.5 mg by mouth daily before breakfast.      levothyroxine (SYNTHROID) 112 MCG tablet Take 1 tablet (112 mcg total) by mouth daily before breakfast. 30 tablet 5   losartan-hydrochlorothiazide (HYZAAR) 50-12.5 MG tablet Take 1 tablet by mouth daily. 90 tablet 3   metoprolol tartrate (LOPRESSOR) 100 MG tablet Take tablet (100mg ) TWO hours prior to your cardiac CT scan. 1 tablet 0   Multiple Vitamin (MULTIVITAMIN WITH MINERALS) TABS tablet Take 1 tablet by mouth daily in the afternoon.     omeprazole (PRILOSEC) 20 MG capsule Take 20 mg by mouth at bedtime.     rosuvastatin (CRESTOR) 5 MG tablet Take 1 tablet (5 mg total) by mouth daily. (Patient taking differently: Take 5 mg by mouth every other day. At night) 90 tablet 3   vitamin B-12 (CYANOCOBALAMIN) 1000 MCG tablet Take 1,000 mcg by mouth daily in the afternoon.      vitamin E 400 UNIT capsule Take 1 capsule by mouth daily.     enoxaparin (LOVENOX) 40 MG/0.4ML injection Inject 0.4 mLs (40 mg total) into the skin daily. (Patient not taking: Reported on 10/28/2023) 5.6 mL 0   oxyCODONE (ROXICODONE) 5 MG immediate release tablet Take 1-2 tablets (5-10 mg total) by mouth every 4 (four) hours as needed for severe pain. (Patient not taking: Reported on 10/28/2023) 30  tablet 0   traMADol (ULTRAM) 50 MG tablet Take 1 tablet (50 mg total) by mouth every 6 (six) hours as needed for moderate pain. (Patient not taking: Reported on 10/28/2023) 30 tablet 0   No current facility-administered medications for this visit.    Past Medical History:  Diagnosis Date   Arthritis    Breast cancer (HCC)    Cancer (HCC)    breast   GERD (gastroesophageal reflux disease)    Headache    Hypertension    Hypothyroidism    PONV (postoperative nausea and vomiting)    Thyroid disease     Past Surgical History:  Procedure Laterality Date   ABDOMINAL HYSTERECTOMY     BACK SURGERY  2001   Lumbar   BREAST SURGERY     CATARACT EXTRACTION W/PHACO Left 05/06/2018    Procedure: CATARACT EXTRACTION PHACO AND INTRAOCULAR LENS PLACEMENT (IOC) LEFT;  Surgeon: Lockie Mola, MD;  Location: [facility];  Service: Ophthalmology;  Laterality: Left;  prefers to arrive around 9 am   CATARACT EXTRACTION W/PHACO Right 05/20/2018   Procedure: CATARACT EXTRACTION PHACO AND INTRAOCULAR LENS PLACEMENT (IOC) RIGHT  TORIC;  Surgeon: Lockie Mola, MD;  Location: [facility];  Service: Ophthalmology;  Laterality: Right;   CHOLECYSTECTOMY     DILATION AND CURETTAGE OF UTERUS     KNEE ARTHROPLASTY Left 12/01/2019   Procedure: COMPUTER ASSISTED TOTAL KNEE ARTHROPLASTY;  Surgeon: Donato Heinz, MD;  Location: [facility];  Service: Orthopedics;  Laterality: Left;   KNEE SURGERY Left 1976   MASTECTOMY Right 2015   breast cancer only treated surgically and with Letrozole   TONSILLECTOMY       Social History   Tobacco Use   Smoking status: Never   Smokeless tobacco: Never  Vaping Use   Vaping status: Never Used  Substance Use Topics   Alcohol use: Never   Drug use: Never     Family History  Problem Relation Age of Onset   Stroke Mother 78   Ovarian cancer Mother    Pemphigus vulgaris Father    Aneurysm Sister        Brain   Hyperlipidemia Daughter    Thyroid disease Daughter 10    Allergies  Allergen Reactions   Codeine Shortness Of Breath   Oxycodone-Acetaminophen Shortness Of Breath, Nausea And Vomiting and Rash   Propoxyphene Shortness Of Breath, Nausea And Vomiting and Rash   Sulfamethoxazole-Trimethoprim Hives and Nausea Only   Albuterol Other (See Comments)    Passed out, increased bp   Demerol [Meperidine] Nausea And Vomiting   Dilaudid [Hydromorphone Hcl] Nausea And Vomiting   Iodinated Contrast Media Rash and Other (See Comments)    Passed out = this was some time ago and patient thinks she had it again without difficulty Passed out = this was some time ago and patient thinks she had it again without difficulty     Morphine And Codeine Nausea And Vomiting   Other Nausea And Vomiting   Sulfa Antibiotics Hives   Lisinopril-Hydrochlorothiazide Nausea Only and Swelling    Tongue swelling   Amoxicillin-Pot Clavulanate Nausea Only   Hydrocodone-Acetaminophen Rash   Levofloxacin Nausea Only   Promethazine Nausea And Vomiting   Tramadol Nausea And Vomiting    Patient states that she tolerates     REVIEW OF SYSTEMS (Negative unless checked)  Constitutional: [] Weight loss  [] Fever  [] Chills Cardiac: [] Chest pain   [] Chest pressure   [] Palpitations   [] Shortness of breath when laying flat   []   Shortness of breath at rest   [] Shortness of breath with exertion. Vascular:  [] Pain in legs with walking   [] Pain in legs at rest   [] Pain in legs when laying flat   [] Claudication   [] Pain in feet when walking  [] Pain in feet at rest  [] Pain in feet when laying flat   [] History of DVT   [] Phlebitis   [] Swelling in legs   [] Varicose veins   [] Non-healing ulcers Pulmonary:   [] Uses home oxygen   [] Productive cough   [] Hemoptysis   [] Wheeze  [] COPD   [] Asthma Neurologic:  [x] Dizziness  [x] Blackouts   [] Seizures   [] History of stroke   [] History of TIA  [] Aphasia   [] Temporary blindness   [] Dysphagia   [] Weakness or numbness in arms   [] Weakness or numbness in legs Musculoskeletal:  [x] Arthritis   [] Joint swelling   [x] Joint pain   [] Low back pain Hematologic:  [] Easy bruising  [] Easy bleeding   [] Hypercoagulable state   [] Anemic   Gastrointestinal:  [] Blood in stool   [] Vomiting blood  [x] Gastroesophageal reflux/heartburn   [] Abdominal pain Genitourinary:  [] Chronic kidney disease   [] Difficult urination  [] Frequent urination  [] Burning with urination   [] Hematuria Skin:  [] Rashes   [] Ulcers   [] Wounds Psychological:  [] History of anxiety   []  History of major depression.  Physical Examination  BP (!) 150/81 (BP Location: Left Arm)   Pulse 91   Resp 18   Ht 5\' 6"  (1.676 m)   Wt 167 lb 12.8 oz (76.1 kg)   BMI 27.08  kg/m  Gen:  WD/WN, NAD. Appears younger than stated age. Head: [place]/AT, No temporalis wasting. Ear/Nose/Throat: Hearing grossly intact, nares w/o erythema or drainage Eyes: Conjunctiva clear. Sclera non-icteric Neck: Supple.  Trachea midline Pulmonary:  Good air movement, no use of accessory muscles.  Cardiac: RRR, no JVD Vascular:  Vessel Right Left  Radial Palpable Palpable               Musculoskeletal: M/S 5/5 throughout.  No deformity or atrophy. No edema. Neurologic: Sensation grossly intact in extremities.  Symmetrical.  Speech is fluent.  Psychiatric: Judgment intact, Mood & affect appropriate for pt's clinical situation. Dermatologic: No rashes or ulcers noted.  No cellulitis or open wounds.      Labs Recent Results (from the past 2160 hour(s))  Basic metabolic panel     Status: Abnormal   Collection Time: 09/03/23  2:07 PM  Result Value Ref Range   Sodium 138 135 - 145 mmol/L   Potassium 3.7 3.5 - 5.1 mmol/L   Chloride 106 98 - 111 mmol/L   CO2 25 22 - 32 mmol/L   Glucose, Bld 153 (H) 70 - 99 mg/dL    Comment: Glucose reference range applies only to samples taken after fasting for at least 8 hours.   BUN 18 8 - 23 mg/dL   Creatinine, Ser 1.47 0.44 - 1.00 mg/dL   Calcium 9.0 8.9 - 82.9 mg/dL   GFR, Estimated >56 >21 mL/min    Comment: (NOTE) Calculated using the CKD-EPI Creatinine Equation (2021)    Anion gap 7 5 - 15    Comment: Performed at [facility], [address] [place], [place] [address]  CBC     Status: None   Collection Time: 09/03/23  2:07 PM  Result Value Ref Range   WBC 7.7 4.0 - 10.5 K/uL   RBC 4.59 3.87 - 5.11 MIL/uL   Hemoglobin 14.1 12.0 - 15.0 g/dL  HCT 42.7 36.0 - 46.0 %   MCV 93.0 80.0 - 100.0 fL   MCH 30.7 26.0 - 34.0 pg   MCHC 33.0 30.0 - 36.0 g/dL   RDW 16.1 09.6 - 04.5 %   Platelets 229 150 - 400 K/uL   nRBC 0.0 0.0 - 0.2 %    Comment: Performed at [facility], [address] [place], [place] [address]   Troponin I (High Sensitivity)     Status: None   Collection Time: 09/03/23  2:07 PM  Result Value Ref Range   Troponin I (High Sensitivity) 4 <18 ng/L    Comment: (NOTE) Elevated high sensitivity troponin I (hsTnI) values and significant  changes across serial measurements may suggest ACS but many other  chronic and acute conditions are known to elevate hsTnI results.  Refer to the "Links" section for chest pain algorithms and additional  guidance. Performed at [facility], [address] [place], [place] [address]   TSH     Status: None   Collection Time: 09/03/23  2:07 PM  Result Value Ref Range   TSH 3.406 0.350 - 4.500 uIU/mL    Comment: Performed by a 3rd Generation assay with a functional sensitivity of <=0.01 uIU/mL. Performed at [facility], [address] [place], [place] [address]   T4, free     Status: None   Collection Time: 09/03/23  2:07 PM  Result Value Ref Range   Free T4 0.93 0.61 - 1.12 ng/dL    Comment: (NOTE) Biotin ingestion may interfere with free T4 tests. If the results are inconsistent with the TSH level, previous test results, or the clinical presentation, then consider biotin interference. If needed, order repeat testing after stopping biotin. Performed at [facility], [address] [place], [place] [address]   Urinalysis, Routine w reflex microscopic -Urine, Clean Catch     Status: Abnormal   Collection Time: 09/03/23  6:02 PM  Result Value Ref Range   Color, Urine STRAW (A) YELLOW   APPearance CLEAR (A) CLEAR   Specific Gravity, Urine 1.006 1.005 - 1.030   pH 6.0 5.0 - 8.0   Glucose, UA NEGATIVE NEGATIVE mg/dL   Hgb urine dipstick NEGATIVE NEGATIVE   Bilirubin Urine NEGATIVE NEGATIVE   Ketones, ur NEGATIVE NEGATIVE mg/dL   Protein, ur NEGATIVE NEGATIVE mg/dL   Nitrite NEGATIVE NEGATIVE   Leukocytes,Ua NEGATIVE NEGATIVE    Comment: Performed at [facility], [address] [place], [place]  [address]  Troponin I (High Sensitivity)     Status: None   Collection Time: 09/03/23  6:02 PM  Result Value Ref Range   Troponin I (High Sensitivity) 4 <18 ng/L    Comment: (NOTE) Elevated high sensitivity troponin I (hsTnI) values and significant  changes across serial measurements may suggest ACS but many other  chronic and acute conditions are known to elevate hsTnI results.  Refer to the "Links" section for chest pain algorithms and additional  guidance. Performed at [facility], [address] [place], [place] [address]     Radiology CT ANGIO NECK W OR WO CONTRAST  Result Date: 11/04/2023 CLINICAL DATA:  Syncopal episodes, left carotid artery stenosis EXAM: CT ANGIOGRAPHY NECK TECHNIQUE: Multidetector CT imaging of the neck was performed using the standard protocol during bolus administration of intravenous contrast. Multiplanar CT image reconstructions and MIPs were obtained to evaluate the vascular anatomy. Carotid stenosis measurements (when applicable) are obtained utilizing NASCET criteria, using the distal internal carotid  diameter as the denominator. RADIATION DOSE REDUCTION: This exam was performed according to the departmental dose-optimization program which includes automated exposure control, adjustment of the mA and/or kV according to patient size and/or use of iterative reconstruction technique. CONTRAST:  75mL OMNIPAQUE IOHEXOL 350 MG/ML SOLN COMPARISON:  No prior CTA of the neck available FINDINGS: Aortic arch: Standard branching. Imaged portion shows no evidence of aneurysm or dissection. No significant stenosis of the major arch vessel origins. Aortic atherosclerosis. Right carotid system: No evidence of dissection, occlusion, or hemodynamically significant stenosis (greater than 50%). Left carotid system: No evidence of dissection, occlusion, or hemodynamically significant stenosis (greater than 50%). Approximately 40% stenosis in the proximal left ICA (series 6,  image 116). Vertebral arteries: Moderate stenosis in the distal left V2 (series 6, image 105 and series 7, image 127), although this may in part be artifactual, secondary to beam hardening artifact. No other hemodynamically significant stenosis in the vertebral arteries. No evidence of dissection or occlusion. The imaged portion the vertebral arteries are patent to the vertebrobasilar junction. Skeleton: No acute osseous abnormality. Degenerative changes in the cervical spine. Other neck: No acute finding. Upper chest: No focal pulmonary opacity or pleural effusion. IMPRESSION: 1. No hemodynamically significant stenosis in the carotid arteries. Approximately 40% stenosis in the proximal left ICA. 2. Moderate stenosis in the distal left V2, although this may in part be artifactual, secondary to beam hardening artifact. No other hemodynamically significant stenosis in the vertebral arteries. 3. Aortic atherosclerosis. Aortic Atherosclerosis (ICD10-I70.0). Electronically Signed   By: Wiliam Ke M.D.   On: 11/04/2023 11:25    Assessment/Plan  Essential (primary) hypertension blood pressure control important in reducing the progression of atherosclerotic disease. On appropriate oral medications.   HLD (hyperlipidemia) lipid control important in reducing the progression of atherosclerotic disease.  Patient has stopped her statin agent due to side effects of nausea making her feel poorly.  She is going to try restarting this at a lower dose.     Carotid stenosis I have independently reviewed her CT angiogram which she has undergone.  The official report is of no significant right carotid stenosis and a 40% left carotid artery stenosis.  Insignificant intracranial disease may have been present as well.  I would generally agree with this report and would estimate the degree of left carotid stenosis in the 40 to 50% range.  This is somewhat less than her outside ultrasound which suggested a 50% stenosis and  may be more but was very vague in its interpretation. Given this finding, I had a long discussion with she and her family today.  I do not believe there is any role for intervention at this degree of stenosis in a patient without clear focal neurologic symptoms.  I would recommend antiplatelets in the form of aspirin daily.  We also recommended she restart her statin agent which she had stopped due to side effects.  She is going to use a lower dose.  I will plan a carotid duplex in 3 to 4 months in follow-up.  She will contact our office with any problems in the interim.    Festus Barren, MD  11/04/2023 3:37 PM    This note was created with Dragon medical transcription system.  Any errors from dictation are purely unintentional

## 2023-11-04 NOTE — Assessment & Plan Note (Signed)
lipid control important in reducing the progression of atherosclerotic disease.  Patient has stopped her statin agent due to side effects of nausea making her feel poorly.  She is going to try restarting this at a lower dose.

## 2023-11-04 NOTE — Assessment & Plan Note (Signed)
blood pressure control important in reducing the progression of atherosclerotic disease. On appropriate oral medications.  

## 2023-11-04 NOTE — Assessment & Plan Note (Signed)
I have independently reviewed her CT angiogram which she has undergone.  The official report is of no significant right carotid stenosis and a 40% left carotid artery stenosis.  Insignificant intracranial disease may have been present as well.  I would generally agree with this report and would estimate the degree of left carotid stenosis in the 40 to 50% range.  This is somewhat less than her outside ultrasound which suggested a 50% stenosis and may be more but was very vague in its interpretation. Given this finding, I had a long discussion with she and her family today.  I do not believe there is any role for intervention at this degree of stenosis in a patient without clear focal neurologic symptoms.  I would recommend antiplatelets in the form of aspirin daily.  We also recommended she restart her statin agent which she had stopped due to side effects.  She is going to use a lower dose.  I will plan a carotid duplex in 3 to 4 months in follow-up.  She will contact our office with any problems in the interim.

## 2023-11-13 DIAGNOSIS — M5481 Occipital neuralgia: Secondary | ICD-10-CM | POA: Diagnosis not present

## 2023-11-13 DIAGNOSIS — M5030 Other cervical disc degeneration, unspecified cervical region: Secondary | ICD-10-CM | POA: Diagnosis not present

## 2023-11-13 DIAGNOSIS — R2689 Other abnormalities of gait and mobility: Secondary | ICD-10-CM | POA: Diagnosis not present

## 2023-11-13 DIAGNOSIS — R42 Dizziness and giddiness: Secondary | ICD-10-CM | POA: Diagnosis not present

## 2023-11-13 DIAGNOSIS — E039 Hypothyroidism, unspecified: Secondary | ICD-10-CM | POA: Diagnosis not present

## 2023-12-09 DIAGNOSIS — M5481 Occipital neuralgia: Secondary | ICD-10-CM | POA: Diagnosis not present

## 2023-12-09 DIAGNOSIS — R42 Dizziness and giddiness: Secondary | ICD-10-CM | POA: Diagnosis not present

## 2023-12-09 DIAGNOSIS — I6523 Occlusion and stenosis of bilateral carotid arteries: Secondary | ICD-10-CM | POA: Diagnosis not present

## 2023-12-10 DIAGNOSIS — H903 Sensorineural hearing loss, bilateral: Secondary | ICD-10-CM | POA: Diagnosis not present

## 2023-12-10 DIAGNOSIS — R42 Dizziness and giddiness: Secondary | ICD-10-CM | POA: Diagnosis not present

## 2023-12-10 DIAGNOSIS — H9313 Tinnitus, bilateral: Secondary | ICD-10-CM | POA: Diagnosis not present

## 2023-12-15 ENCOUNTER — Ambulatory Visit: Payer: Medicare HMO

## 2023-12-17 ENCOUNTER — Ambulatory Visit: Payer: Medicare HMO

## 2023-12-22 ENCOUNTER — Ambulatory Visit: Payer: Medicare HMO

## 2023-12-24 ENCOUNTER — Ambulatory Visit: Payer: Medicare HMO

## 2023-12-25 ENCOUNTER — Ambulatory Visit: Payer: Medicare HMO

## 2023-12-26 DIAGNOSIS — E039 Hypothyroidism, unspecified: Secondary | ICD-10-CM | POA: Diagnosis not present

## 2023-12-29 ENCOUNTER — Ambulatory Visit: Payer: Medicare HMO | Attending: Student

## 2023-12-29 DIAGNOSIS — M542 Cervicalgia: Secondary | ICD-10-CM | POA: Diagnosis not present

## 2023-12-29 DIAGNOSIS — R42 Dizziness and giddiness: Secondary | ICD-10-CM | POA: Diagnosis not present

## 2023-12-29 DIAGNOSIS — R2681 Unsteadiness on feet: Secondary | ICD-10-CM | POA: Insufficient documentation

## 2023-12-29 DIAGNOSIS — R262 Difficulty in walking, not elsewhere classified: Secondary | ICD-10-CM | POA: Diagnosis not present

## 2023-12-29 NOTE — Therapy (Signed)
OUTPATIENT PHYSICAL THERAPY VESTIBULAR EVALUATION     Patient Name: Margaret Townsend MRN: [id number] DOB:01-31-1946, 78 y.o., female Today's Date: 12/30/2023  END OF SESSION:  PT End of Session - 12/30/23 0836     Visit Number 1    Number of Visits 25    Date for PT Re-Evaluation 03/22/24    PT Start Time 1420    PT Stop Time 1510    PT Time Calculation (min) 50 min    Equipment Utilized During Treatment Gait belt    Activity Tolerance Patient tolerated treatment well    Behavior During Therapy WFL for tasks assessed/performed             Past Medical History:  Diagnosis Date   Arthritis    Breast cancer (HCC)    Cancer (HCC)    breast   GERD (gastroesophageal reflux disease)    Headache    Hypertension    Hypothyroidism    PONV (postoperative nausea and vomiting)    Thyroid disease    Past Surgical History:  Procedure Laterality Date   ABDOMINAL HYSTERECTOMY     BACK SURGERY  2001   Lumbar   BREAST SURGERY     CATARACT EXTRACTION W/PHACO Left 05/06/2018   Procedure: CATARACT EXTRACTION PHACO AND INTRAOCULAR LENS PLACEMENT (IOC) LEFT;  Surgeon: Lockie Mola, MD;  Location: [facility];  Service: Ophthalmology;  Laterality: Left;  prefers to arrive around 9 am   CATARACT EXTRACTION W/PHACO Right 05/20/2018   Procedure: CATARACT EXTRACTION PHACO AND INTRAOCULAR LENS PLACEMENT (IOC) RIGHT  TORIC;  Surgeon: Lockie Mola, MD;  Location: [facility];  Service: Ophthalmology;  Laterality: Right;   CHOLECYSTECTOMY     DILATION AND CURETTAGE OF UTERUS     KNEE ARTHROPLASTY Left 12/01/2019   Procedure: COMPUTER ASSISTED TOTAL KNEE ARTHROPLASTY;  Surgeon: Donato Heinz, MD;  Location: [facility];  Service: Orthopedics;  Laterality: Left;   KNEE SURGERY Left 1976   MASTECTOMY Right 2015   breast cancer only treated surgically and with Letrozole   TONSILLECTOMY     Patient Active Problem List   Diagnosis Date Noted   Carotid stenosis  11/04/2023   Osteoarthritis of left knee 12/01/2019   Primary osteoarthritis of left knee 09/26/2019   Peripheral polyneuropathy 08/01/2019   Chronic pain of left knee 08/01/2019   Heart palpitations 08/17/2018   History of vitamin D deficiency 11/07/2017   Prediabetes 11/07/2017   Pure hypercholesterolemia 11/06/2017   Degenerative disc disease, cervical 09/21/2016   Allergic rhinitis, seasonal 11/13/2015   Avitaminosis D 11/13/2015   Essential (primary) hypertension 08/28/2015   Gastro-esophageal reflux disease without esophagitis 08/28/2015   Combined fat and carbohydrate induced hyperlipemia 08/28/2015   Malignant neoplasm of breast (HCC) 07/13/2014   HLD (hyperlipidemia) 05/09/2014   Inflamed nasal mucosa 05/09/2014   Acid reflux 04/06/2014   BP (high blood pressure) 04/06/2014   Adult hypothyroidism 04/06/2014   Degenerative arthritis of lumbar spine 04/06/2014    PCP: Patrice Paradise, MD  REFERRING PROVIDER: Gigi Gin, PA   REFERRING DIAG: R42 (ICD-10-CM) - Dizziness and giddiness  THERAPY DIAG:  Dizziness and giddiness  Unsteadiness on feet  Difficulty in walking, not elsewhere classified  Cervicalgia  ONSET DATE: 2017  Rationale for Evaluation and Treatment: Rehabilitation  SUBJECTIVE:   SUBJECTIVE STATEMENT:  Pt reports onset of headaches Dec. 23 2024, where pt had injections for occipital neuralgia. She report symptoms have gradually worsened. Says she has had "everything checked" except her eyes. Has  appt in in middle of Feb for yearly vision check-up. She reports no hx of migraines, but that vestibular migraine was discussed as a possibility with her neurologist. Pt becomes symptomatic when looking at her computer or scrolling, when watching television. She describes dizziness as a lightheadedness. Looking out the window while in a car or going into a busy grocery store can have the same effect. She reports no spinning sensations, says her  symptoms are "like I'm in a fog." Pt with hx of fall back in October where she fell in hallway at hospital upon leaving fitness center and rapid response was called.  She reports they found her BP was high, and she went to the ED and had her BP meds changed.  No current vertigo, but did have vertigo a year ago. She has not had headaches since she started taking gabapentin. Pt feels posterior neck soreness sometimes when she lays down. Reports hx of concussion following fall in 2017 with LOC, thinks "slight" dizzy symptoms started back then. Pt taking chair aerobics at the [facility]. She says lights on help symptoms. She reports no sound or light sensitivity. Reports her eye implants are to help with reading in R eye and L eye for distance. She feels R eye is giving her the most difficulty.   Pt accompanied by: self  PERTINENT HISTORY:   Per chart & confirmed by pt: pt experiencing weakness, dizziness/lightheadedness, imbalance, blurry vision, tinnitus, with hx of bilateral carotid stenosis, occipital neuralgia, cervical degenerative disc disease and anxiety. Sx onset following occipital nerve block 11/03/2023. 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 Hearing loss L ear. Found to have HF SN hearing loss. Negative BBPV testing per note 12/11/2023 from ENT, also repots pt with hx of hypofxn years ago. Hx of lower back surgery 1980s (per chart in 2001 to lumbar region). Per chart PMH significant for arthritis, breast CA, headache, HTN, hypothyroidism. L knee arthroplasty (2020)  PAIN:  Are you having pain? No not currently, but does endorse cervical soreness/pain at times  PRECAUTIONS: Fall  RED FLAGS: none  WEIGHT BEARING RESTRICTIONS: No  FALLS: Has patient fallen in last 6 months? Yes. Number of falls 1  LIVING ENVIRONMENT: Lives with: lives with their spouse Lives in: House/apartment Stairs:  stairs just going down into the yard 6, pt reports has handrails bilat,reports fall with hx of  concussion prior to handrails (2017)  PLOF: Independent  PATIENT GOALS: improve symptoms  OBJECTIVE:  Note: Objective measures were completed at Evaluation unless otherwise noted.  DIAGNOSTIC FINDINGS:   11/04/2023 CT ANGIO NECK: " IMPRESSION: 1. No hemodynamically significant stenosis in the carotid arteries. Approximately 40% stenosis in the proximal left ICA. 2. Moderate stenosis in the distal left V2, although this may in part be artifactual, secondary to beam hardening artifact. No other hemodynamically significant stenosis in the vertebral arteries. 3. Aortic atherosclerosis.   Aortic Atherosclerosis (ICD10-I70.0).     Electronically Signed   By: Wiliam Ke M.D.   On: 11/04/2023 11:25"  09/03/2023 CT HEAD "IMPRESSION: No acute intracranial abnormality.     Electronically Signed   By: Minerva Fester M.D.   On: 09/03/2023 16:36"  COGNITION: Overall cognitive status: Within functional limits for tasks assessed   POSTURE:  Slight increase in kyphosis, stiff posture in standing and most notable with gait   GAIT: Gait pattern:  to be formally assessed future visit, but observed decrease in step-length and decrease in heel strike bilat, impaired balance with scanning Distance walked: clinic distances  Assistive device utilized: None Level of assistance: Complete Independence   FUNCTIONAL TESTS:  DGI - deferred to future visit   PATIENT SURVEYS:  DHI 36   VESTIBULAR ASSESSMENT:  OCULOMOTOR EXAM:  Ocular Alignment: normal  Ocular ROM: No Limitations - makes pt feel slightly lightheaded   Spontaneous Nystagmus: absent  Gaze-Induced Nystagmus: absent  Smooth Pursuits: intact  Saccades:  corrective saccade consistently with L gaze with horizontal testing - testing vertical and horizontal makes pt moderately dizzy  Convergence/Divergence: WNL   VESTIBULAR - OCULAR REFLEX:   Slow VOR: Normal - makes pt dizzy   VOR Cancellation: Normal - makes pt  dizzy  Head-Impulse Test: Normal but makes pt dizzy   DVA- deferred    Modified Motion Sensitivity Test  Movement Intensity (change from baseline, 0-10, no change=0, severe change=10) Duration  <5 sec = 0  5-10 s = 1 11-20 s = 2 21-30 s = 3 >30 s = 4 Score (Intensity + Duration)  5x Horizontal head turns 0    5x Vertical head turns 5 20 sec 7  5x Right diagonal head turns (upper left quadrant down to right) 5 25 sec 8  5x Left diagonal head turns (upper right quadrant down to left) 0    5x Trunk Bends (bending knees reaching to floor) 0    5x Right quarter body urns (look over right shoulder with trunk rotation, feet planted) 6 16 sec 8  5x Left quarter body turns (look over left shoulder with trunk rotation, feet planted) 0    1x 360 degree turn to right 7 >30 sec 11  1x 360 degree turn to left 0    5x VOR cancellation (follow thumbs horizontally with head/trunk rotation x45 degrees each way) 0    TOTAL SCORE      MSQ = total score x (# of positions)/14   0-10 mild range 11-30 moderate range 31-100 severe range   9.7 = mild    Comments: Pt with mild unsteadiness in standing when dizzy with MMS testing   OTHOSTATICS: deferred                                                                                                                             TREATMENT DATE: EVAL ONLY    PATIENT EDUCATION: Education details: assessment findings, goals/plan Person educated: Patient Education method: Explanation Education comprehension: verbalized understanding  HOME EXERCISE PROGRAM:  GOALS:  GOALS: Goals reviewed with patient? Yes  SHORT TERM GOALS: Target date: 02/09/2024   Patient will be independent in home exercise program to improve strength/mobility for better functional independence with ADLs. Baseline: Goal status: INITIAL   LONG TERM GOALS: Target date: 03/22/2024    The patient will report at least 30% reduction in symptoms with using her computer or watching  TV in order to increase ease with daily activities and improve QOL. Baseline: currently makes pt symptomatic  Goal status: INITIAL  2.  Patient will reduce  dizziness handicap inventory score to <30, for less dizziness with ADLs and increased safety with home and work tasks.  Baseline: 36 Goal status: INITIAL  3.  Patient will increase ABC scale score >80% to demonstrate better functional mobility and better confidence with ADLs.  Baseline: deferred  Goal status: INITIAL  4.  Patient will increase dynamic gait index score to >19/24 as to demonstrate reduced fall risk and improved dynamic gait balance for better safety with community/home ambulation.   Baseline: deferred Goal status: INITIAL   ASSESSMENT:  CLINICAL IMPRESSION: Patient is a pleasant 78 y.o. female who was seen today for physical therapy evaluation and treatment for dizziness and imbalance. Exam reveals hx of concussion, cervical pain, past and recent falls, headaches, dizziness, mild motion sensitivity and symptoms consistent with visual-motion sensitivity. Pt found to have impaired saccades and majority of testing recreated her dizziness although all other eye movements found to be WNL. Pt also with decreased balance with MMS testing, and observed gait impairments in session. The pt will benefit from further skilled PT to address these deficits in order to decrease fall risk and improve QOL.  OBJECTIVE IMPAIRMENTS: Abnormal gait, decreased activity tolerance, decreased balance, decreased mobility, difficulty walking, dizziness, improper body mechanics, postural dysfunction, and pain.   ACTIVITY LIMITATIONS: standing, stairs, and locomotion level  PARTICIPATION LIMITATIONS: meal prep, driving, shopping, community activity, and yard work  PERSONAL FACTORS: Age, Past/current experiences, Sex, Time since onset of injury/illness/exacerbation, and 3+ comorbidities: Per chart PMH significant for arthritis, breast CA, headache,  HTN, hypothyroidism. L knee arthroplasty (2020)  are also affecting patient's functional outcome.   REHAB POTENTIAL: Good  CLINICAL DECISION MAKING: Evolving/moderate complexity  EVALUATION COMPLEXITY: Moderate   PLAN:  PT FREQUENCY: 1-2x/week  PT DURATION: 12 weeks  PLANNED INTERVENTIONS: 97164- PT Re-evaluation, 97110-Therapeutic exercises, 97530- Therapeutic activity, 97112- Neuromuscular re-education, 97535- Self Care, 16109- Manual therapy, [phone]- Gait training, [phone]- Orthotic Fit/training, [phone]- Canalith repositioning, Patient/Family education, Balance training, Stair training, Taping, Dry Needling, Joint mobilization, Spinal mobilization, Scar mobilization, Vestibular training, Visual/preceptual remediation/compensation, DME instructions, Cryotherapy, and Moist heat  PLAN FOR NEXT SESSION: orthostatics, initiate HEP, VORx1, balance   Baird Kay, PT 12/30/2023, 8:38 AM

## 2023-12-30 DIAGNOSIS — K219 Gastro-esophageal reflux disease without esophagitis: Secondary | ICD-10-CM | POA: Diagnosis not present

## 2023-12-30 DIAGNOSIS — R7303 Prediabetes: Secondary | ICD-10-CM | POA: Diagnosis not present

## 2023-12-30 DIAGNOSIS — E782 Mixed hyperlipidemia: Secondary | ICD-10-CM | POA: Diagnosis not present

## 2023-12-30 DIAGNOSIS — R002 Palpitations: Secondary | ICD-10-CM | POA: Diagnosis not present

## 2023-12-30 DIAGNOSIS — R2689 Other abnormalities of gait and mobility: Secondary | ICD-10-CM | POA: Diagnosis not present

## 2023-12-30 DIAGNOSIS — R0602 Shortness of breath: Secondary | ICD-10-CM | POA: Diagnosis not present

## 2023-12-30 DIAGNOSIS — G629 Polyneuropathy, unspecified: Secondary | ICD-10-CM | POA: Diagnosis not present

## 2023-12-30 DIAGNOSIS — I2089 Other forms of angina pectoris: Secondary | ICD-10-CM | POA: Diagnosis not present

## 2023-12-30 DIAGNOSIS — E039 Hypothyroidism, unspecified: Secondary | ICD-10-CM | POA: Diagnosis not present

## 2023-12-31 ENCOUNTER — Ambulatory Visit: Payer: Medicare HMO

## 2024-01-01 DIAGNOSIS — Z961 Presence of intraocular lens: Secondary | ICD-10-CM | POA: Diagnosis not present

## 2024-01-01 DIAGNOSIS — Z0100 Encounter for examination of eyes and vision without abnormal findings: Secondary | ICD-10-CM | POA: Diagnosis not present

## 2024-01-01 DIAGNOSIS — H04123 Dry eye syndrome of bilateral lacrimal glands: Secondary | ICD-10-CM | POA: Diagnosis not present

## 2024-01-01 DIAGNOSIS — H353131 Nonexudative age-related macular degeneration, bilateral, early dry stage: Secondary | ICD-10-CM | POA: Diagnosis not present

## 2024-01-01 DIAGNOSIS — H538 Other visual disturbances: Secondary | ICD-10-CM | POA: Diagnosis not present

## 2024-01-05 ENCOUNTER — Ambulatory Visit: Payer: Medicare HMO | Attending: Student

## 2024-01-05 DIAGNOSIS — M542 Cervicalgia: Secondary | ICD-10-CM | POA: Insufficient documentation

## 2024-01-05 DIAGNOSIS — R262 Difficulty in walking, not elsewhere classified: Secondary | ICD-10-CM | POA: Insufficient documentation

## 2024-01-05 DIAGNOSIS — R2681 Unsteadiness on feet: Secondary | ICD-10-CM | POA: Insufficient documentation

## 2024-01-05 DIAGNOSIS — R278 Other lack of coordination: Secondary | ICD-10-CM | POA: Diagnosis not present

## 2024-01-05 DIAGNOSIS — R42 Dizziness and giddiness: Secondary | ICD-10-CM | POA: Insufficient documentation

## 2024-01-05 NOTE — Therapy (Signed)
OUTPATIENT PHYSICAL THERAPY VESTIBULAR TREATMENT     Patient Name: Margaret Townsend MRN: [id number] DOB:1946-07-28, 78 y.o., female Today's Date: 01/05/2024  END OF SESSION:  PT End of Session - 01/05/24 1147     Visit Number 2    Number of Visits 25    Date for PT Re-Evaluation 03/22/24    PT Start Time 1149    PT Stop Time 1229    PT Time Calculation (min) 40 min    Equipment Utilized During Treatment Gait belt    Activity Tolerance Patient tolerated treatment well    Behavior During Therapy WFL for tasks assessed/performed             Past Medical History:  Diagnosis Date   Arthritis    Breast cancer (HCC)    Cancer (HCC)    breast   GERD (gastroesophageal reflux disease)    Headache    Hypertension    Hypothyroidism    PONV (postoperative nausea and vomiting)    Thyroid disease    Past Surgical History:  Procedure Laterality Date   ABDOMINAL HYSTERECTOMY     BACK SURGERY  2001   Lumbar   BREAST SURGERY     CATARACT EXTRACTION W/PHACO Left 05/06/2018   Procedure: CATARACT EXTRACTION PHACO AND INTRAOCULAR LENS PLACEMENT (IOC) LEFT;  Surgeon: Lockie Mola, MD;  Location: [facility];  Service: Ophthalmology;  Laterality: Left;  prefers to arrive around 9 am   CATARACT EXTRACTION W/PHACO Right 05/20/2018   Procedure: CATARACT EXTRACTION PHACO AND INTRAOCULAR LENS PLACEMENT (IOC) RIGHT  TORIC;  Surgeon: Lockie Mola, MD;  Location: [facility];  Service: Ophthalmology;  Laterality: Right;   CHOLECYSTECTOMY     DILATION AND CURETTAGE OF UTERUS     KNEE ARTHROPLASTY Left 12/01/2019   Procedure: COMPUTER ASSISTED TOTAL KNEE ARTHROPLASTY;  Surgeon: Donato Heinz, MD;  Location: [facility];  Service: Orthopedics;  Laterality: Left;   KNEE SURGERY Left 1976   MASTECTOMY Right 2015   breast cancer only treated surgically and with Letrozole   TONSILLECTOMY     Patient Active Problem List   Diagnosis Date Noted   Carotid stenosis  11/04/2023   Osteoarthritis of left knee 12/01/2019   Primary osteoarthritis of left knee 09/26/2019   Peripheral polyneuropathy 08/01/2019   Chronic pain of left knee 08/01/2019   Heart palpitations 08/17/2018   History of vitamin D deficiency 11/07/2017   Prediabetes 11/07/2017   Pure hypercholesterolemia 11/06/2017   Degenerative disc disease, cervical 09/21/2016   Allergic rhinitis, seasonal 11/13/2015   Avitaminosis D 11/13/2015   Essential (primary) hypertension 08/28/2015   Gastro-esophageal reflux disease without esophagitis 08/28/2015   Combined fat and carbohydrate induced hyperlipemia 08/28/2015   Malignant neoplasm of breast (HCC) 07/13/2014   HLD (hyperlipidemia) 05/09/2014   Inflamed nasal mucosa 05/09/2014   Acid reflux 04/06/2014   BP (high blood pressure) 04/06/2014   Adult hypothyroidism 04/06/2014   Degenerative arthritis of lumbar spine 04/06/2014    PCP: Patrice Paradise, MD  REFERRING PROVIDER: Gigi Gin, PA   REFERRING DIAG: R42 (ICD-10-CM) - Dizziness and giddiness  THERAPY DIAG:  Dizziness and giddiness  ONSET DATE: 2017  Rationale for Evaluation and Treatment: Rehabilitation  SUBJECTIVE:   SUBJECTIVE STATEMENT:  Pt reports posterior head pain (chronic issue) and that she was "kind of wobbly" yesterday. Reports she had to take Tyelnol to sleep. Pt says symptoms lasted all day, but this morning she feels better. She says she did not take gabapentin last night  and was worried it has been contributing to symptoms. Pt reports she was advised by physician to stop gabapentin if she did experience strange symptoms.   Pt accompanied by: self  PERTINENT HISTORY:   Per chart & confirmed by pt: pt experiencing weakness, dizziness/lightheadedness, imbalance, blurry vision, tinnitus, with hx of bilateral carotid stenosis, occipital neuralgia, cervical degenerative disc disease and anxiety. Sx onset following occipital nerve block 11/03/2023.  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 Hearing loss L ear. Found to have HF SN hearing loss. Negative BBPV testing per note 12/11/2023 from ENT, also repots pt with hx of hypofxn years ago. Hx of lower back surgery 1980s (per chart in 2001 to lumbar region). Per chart PMH significant for arthritis, breast CA, headache, HTN, hypothyroidism. L knee arthroplasty (2020)  PAIN:  Are you having pain? No not currently, but does endorse cervical soreness/pain at times  PRECAUTIONS: Fall  RED FLAGS: none  WEIGHT BEARING RESTRICTIONS: No  FALLS: Has patient fallen in last 6 months? Yes. Number of falls 1  LIVING ENVIRONMENT: Lives with: lives with their spouse Lives in: House/apartment Stairs:  stairs just going down into the yard 6, pt reports has handrails bilat,reports fall with hx of concussion prior to handrails (2017)  PLOF: Independent  PATIENT GOALS: improve symptoms  OBJECTIVE:  Note: Objective measures were completed at Evaluation unless otherwise noted.  DIAGNOSTIC FINDINGS:   11/04/2023 CT ANGIO NECK: " IMPRESSION: 1. No hemodynamically significant stenosis in the carotid arteries. Approximately 40% stenosis in the proximal left ICA. 2. Moderate stenosis in the distal left V2, although this may in part be artifactual, secondary to beam hardening artifact. No other hemodynamically significant stenosis in the vertebral arteries. 3. Aortic atherosclerosis.   Aortic Atherosclerosis (ICD10-I70.0).     Electronically Signed   By: Wiliam Ke M.D.   On: 11/04/2023 11:25"  09/03/2023 CT HEAD "IMPRESSION: No acute intracranial abnormality.     Electronically Signed   By: Minerva Fester M.D.   On: 09/03/2023 16:36"  COGNITION: Overall cognitive status: Within functional limits for tasks assessed   POSTURE:  Slight increase in kyphosis, stiff posture in standing and most notable with gait   GAIT: Gait pattern:  to be formally assessed future visit, but observed  decrease in step-length and decrease in heel strike bilat, impaired balance with scanning Distance walked: clinic distances Assistive device utilized: None Level of assistance: Complete Independence   FUNCTIONAL TESTS:  DGI - deferred to future visit   PATIENT SURVEYS:  DHI 36   VESTIBULAR ASSESSMENT:  OCULOMOTOR EXAM:  Ocular Alignment: normal  Ocular ROM: No Limitations - makes pt feel slightly lightheaded   Spontaneous Nystagmus: absent  Gaze-Induced Nystagmus: absent  Smooth Pursuits: intact  Saccades:  corrective saccade consistently with L gaze with horizontal testing - testing vertical and horizontal makes pt moderately dizzy  Convergence/Divergence: WNL   VESTIBULAR - OCULAR REFLEX:   Slow VOR: Normal - makes pt dizzy   VOR Cancellation: Normal - makes pt dizzy  Head-Impulse Test: Normal but makes pt dizzy   DVA- deferred    Modified Motion Sensitivity Test  Movement Intensity (change from baseline, 0-10, no change=0, severe change=10) Duration  <5 sec = 0  5-10 s = 1 11-20 s = 2 21-30 s = 3 >30 s = 4 Score (Intensity + Duration)  5x Horizontal head turns 0    5x Vertical head turns 5 20 sec 7  5x Right diagonal head turns (upper left quadrant down to right)  5 25 sec 8  5x Left diagonal head turns (upper right quadrant down to left) 0    5x Trunk Bends (bending knees reaching to floor) 0    5x Right quarter body urns (look over right shoulder with trunk rotation, feet planted) 6 16 sec 8  5x Left quarter body turns (look over left shoulder with trunk rotation, feet planted) 0    1x 360 degree turn to right 7 >30 sec 11  1x 360 degree turn to left 0    5x VOR cancellation (follow thumbs horizontally with head/trunk rotation x45 degrees each way) 0    TOTAL SCORE      MSQ = total score x (# of positions)/14   0-10 mild range 11-30 moderate range 31-100 severe range   9.7 = mild    Comments: Pt with mild unsteadiness in standing when dizzy with MMS  testing   OTHOSTATICS: deferred                                                                                                                             TREATMENT DATE:  NMR: Seated VORx1, plain background, vertical head turn x 3 min total, performed in 15-30 second interval to reach total of 3 min with correct technique. Multiple practice rounds to achieve correct technique with extensive PT demo/education.  Seated, optokinetic training grocery store video - 15-30 sec chunks up to 2 min total. Recovery intervals provided.  ABC scale: 28.75   Self-care/Home management: PT provides extensive education on symptom modulation, grounding techniques, progression of interventions, when to take rest breaks and when to resume activities, PT provided instruction with HEP/purpose of frequency, habituation, and pacing and impact on prognosis  Access Code: [email or web address] URL: [email or web address] Date: 01/05/2024 Prepared by: Temple Pacini  Exercises - Seated Gaze Stabilization with Head Nod  - 1 x daily - 7 x weekly - 6 sets - 1 reps - 30 sec hold  PATIENT EDUCATION: Education details: HEP, exercise technique Person educated: Patient Education method: IT trainer, verbal cue, handout  Education comprehension: verbalized understanding, returned demo  HOME EXERCISE PROGRAM:  Access Code: [email or web address] URL: [email or web address] Date: 01/05/2024 Prepared by: Temple Pacini  Exercises - Seated Gaze Stabilization with Head Nod  - 1 x daily - 7 x weekly - 6 sets - 1 reps - 30 sec hold  Optokinetic training video: grocery store. Please try to complete 2 minutes total daily, but only watch the video in 10-15 second intervals or as much as you can tolerate with only becoming mildly dizzy. Please then take a rest break until your symptoms come back down then try again.  Complete in a seated position.   [email or web address]   GOALS:  GOALS: Goals reviewed with patient? Yes  SHORT TERM GOALS: Target date: 02/09/2024   Patient will be independent in home exercise program to improve strength/mobility for better functional independence with ADLs. Baseline: Goal status: INITIAL   LONG TERM GOALS: Target date:  03/22/2024    The patient will report at least 30% reduction in symptoms with using her computer or watching TV in order to increase ease with daily activities and improve QOL. Baseline: currently makes pt symptomatic  Goal status: INITIAL  2.  Patient will reduce dizziness handicap inventory score to <30, for less dizziness with ADLs and increased safety with home and work tasks.  Baseline: 36 Goal status: INITIAL  3.  Patient will increase ABC scale score >80% to demonstrate better functional mobility and better confidence with ADLs.  Baseline: 28.75%  Goal status: INITIAL  4.  Patient will increase dynamic gait index score to >19/24 as to demonstrate reduced fall risk and improved dynamic gait balance for better safety with community/home ambulation.   Baseline: deferred Goal status: INITIAL   ASSESSMENT:  CLINICAL IMPRESSION: ABC scale score indicates low balance confidence. Pt also initiated HEP with emphasis on pacing, rest breaks, technique and importance of frequency. Pt found to be most symptomatic with optokinetic training. Optokinetic training video emailed to pt at email address provided by pt in session and with pt's consent. The pt will benefit from further skilled PT to address these deficits in order to decrease fall risk and improve QOL.  OBJECTIVE IMPAIRMENTS: Abnormal gait, decreased activity tolerance, decreased balance, decreased mobility, difficulty walking, dizziness, improper body mechanics, postural dysfunction, and pain.   ACTIVITY LIMITATIONS: standing, stairs, and locomotion level  PARTICIPATION LIMITATIONS: meal prep,  driving, shopping, community activity, and yard work  PERSONAL FACTORS: Age, Past/current experiences, Sex, Time since onset of injury/illness/exacerbation, and 3+ comorbidities: Per chart PMH significant for arthritis, breast CA, headache, HTN, hypothyroidism. L knee arthroplasty (2020)  are also affecting patient's functional outcome.   REHAB POTENTIAL: Good  CLINICAL DECISION MAKING: Evolving/moderate complexity  EVALUATION COMPLEXITY: Moderate   PLAN:  PT FREQUENCY: 1-2x/week  PT DURATION: 12 weeks  PLANNED INTERVENTIONS: 97164- PT Re-evaluation, 97110-Therapeutic exercises, 97530- Therapeutic activity, 97112- Neuromuscular re-education, 97535- Self Care, 81191- Manual therapy, [phone]- Gait training, [phone]- Orthotic Fit/training, [phone]- Canalith repositioning, Patient/Family education, Balance training, Stair training, Taping, Dry Needling, Joint mobilization, Spinal mobilization, Scar mobilization, Vestibular training, Visual/preceptual remediation/compensation, DME instructions, Cryotherapy, and Moist heat  PLAN FOR NEXT SESSION: orthostatics, initiate HEP, VORx1, balance   Baird Kay, PT 01/05/2024, 1:33 PM

## 2024-01-06 ENCOUNTER — Ambulatory Visit: Payer: Medicare HMO

## 2024-01-08 ENCOUNTER — Ambulatory Visit: Payer: Medicare HMO

## 2024-01-12 ENCOUNTER — Ambulatory Visit: Payer: Medicare HMO

## 2024-01-14 ENCOUNTER — Ambulatory Visit: Payer: Medicare HMO

## 2024-01-14 DIAGNOSIS — R2681 Unsteadiness on feet: Secondary | ICD-10-CM | POA: Diagnosis not present

## 2024-01-14 DIAGNOSIS — R262 Difficulty in walking, not elsewhere classified: Secondary | ICD-10-CM | POA: Diagnosis not present

## 2024-01-14 DIAGNOSIS — R278 Other lack of coordination: Secondary | ICD-10-CM | POA: Diagnosis not present

## 2024-01-14 DIAGNOSIS — R42 Dizziness and giddiness: Secondary | ICD-10-CM | POA: Diagnosis not present

## 2024-01-14 DIAGNOSIS — M542 Cervicalgia: Secondary | ICD-10-CM | POA: Diagnosis not present

## 2024-01-14 NOTE — Therapy (Signed)
OUTPATIENT PHYSICAL THERAPY VESTIBULAR TREATMENT     Patient Name: Margaret Townsend MRN: [id number] DOB:10-15-1946, 78 y.o., female Today's Date: 01/14/2024  END OF SESSION:  PT End of Session - 01/14/24 1057     Visit Number 3    Number of Visits 25    Date for PT Re-Evaluation 03/22/24    PT Start Time 1100    PT Stop Time 1144    PT Time Calculation (min) 44 min    Equipment Utilized During Treatment Gait belt    Activity Tolerance Patient tolerated treatment well    Behavior During Therapy WFL for tasks assessed/performed              Past Medical History:  Diagnosis Date   Arthritis    Breast cancer (HCC)    Cancer (HCC)    breast   GERD (gastroesophageal reflux disease)    Headache    Hypertension    Hypothyroidism    PONV (postoperative nausea and vomiting)    Thyroid disease    Past Surgical History:  Procedure Laterality Date   ABDOMINAL HYSTERECTOMY     BACK SURGERY  2001   Lumbar   BREAST SURGERY     CATARACT EXTRACTION W/PHACO Left 05/06/2018   Procedure: CATARACT EXTRACTION PHACO AND INTRAOCULAR LENS PLACEMENT (IOC) LEFT;  Surgeon: Lockie Mola, MD;  Location: [facility];  Service: Ophthalmology;  Laterality: Left;  prefers to arrive around 9 am   CATARACT EXTRACTION W/PHACO Right 05/20/2018   Procedure: CATARACT EXTRACTION PHACO AND INTRAOCULAR LENS PLACEMENT (IOC) RIGHT  TORIC;  Surgeon: Lockie Mola, MD;  Location: [facility];  Service: Ophthalmology;  Laterality: Right;   CHOLECYSTECTOMY     DILATION AND CURETTAGE OF UTERUS     KNEE ARTHROPLASTY Left 12/01/2019   Procedure: COMPUTER ASSISTED TOTAL KNEE ARTHROPLASTY;  Surgeon: Donato Heinz, MD;  Location: [facility];  Service: Orthopedics;  Laterality: Left;   KNEE SURGERY Left 1976   MASTECTOMY Right 2015   breast cancer only treated surgically and with Letrozole   TONSILLECTOMY     Patient Active Problem List   Diagnosis Date Noted   Carotid stenosis  11/04/2023   Osteoarthritis of left knee 12/01/2019   Primary osteoarthritis of left knee 09/26/2019   Peripheral polyneuropathy 08/01/2019   Chronic pain of left knee 08/01/2019   Heart palpitations 08/17/2018   History of vitamin D deficiency 11/07/2017   Prediabetes 11/07/2017   Pure hypercholesterolemia 11/06/2017   Degenerative disc disease, cervical 09/21/2016   Allergic rhinitis, seasonal 11/13/2015   Avitaminosis D 11/13/2015   Essential (primary) hypertension 08/28/2015   Gastro-esophageal reflux disease without esophagitis 08/28/2015   Combined fat and carbohydrate induced hyperlipemia 08/28/2015   Malignant neoplasm of breast (HCC) 07/13/2014   HLD (hyperlipidemia) 05/09/2014   Inflamed nasal mucosa 05/09/2014   Acid reflux 04/06/2014   BP (high blood pressure) 04/06/2014   Adult hypothyroidism 04/06/2014   Degenerative arthritis of lumbar spine 04/06/2014    PCP: Patrice Paradise, MD  REFERRING PROVIDER: Gigi Gin, PA   REFERRING DIAG: R42 (ICD-10-CM) - Dizziness and giddiness  THERAPY DIAG:  Unsteadiness on feet  Difficulty in walking, not elsewhere classified  Other lack of coordination  Dizziness and giddiness  ONSET DATE: 2017  Rationale for Evaluation and Treatment: Rehabilitation  SUBJECTIVE:   SUBJECTIVE STATEMENT:  Pt arriving from aerobics class, says that it left her head spinning. Overall things have been better. Recently got eye drops which has helped some of her  symptoms.  Pt accompanied by: self  PERTINENT HISTORY:   Per chart & confirmed by pt: pt experiencing weakness, dizziness/lightheadedness, imbalance, blurry vision, tinnitus, with hx of bilateral carotid stenosis, occipital neuralgia, cervical degenerative disc disease and anxiety. Sx onset following occipital nerve block 11/03/2023. 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 Hearing loss L ear. Found to have HF SN hearing loss. Negative BBPV testing per note 12/11/2023  from ENT, also repots pt with hx of hypofxn years ago. Hx of lower back surgery 1980s (per chart in 2001 to lumbar region). Per chart PMH significant for arthritis, breast CA, headache, HTN, hypothyroidism. L knee arthroplasty (2020)  PAIN:  Are you having pain? No not currently, but does endorse cervical soreness/pain at times  PRECAUTIONS: Fall  RED FLAGS: none  WEIGHT BEARING RESTRICTIONS: No  FALLS: Has patient fallen in last 6 months? Yes. Number of falls 1  LIVING ENVIRONMENT: Lives with: lives with their spouse Lives in: House/apartment Stairs:  stairs just going down into the yard 6, pt reports has handrails bilat,reports fall with hx of concussion prior to handrails (2017)  PLOF: Independent  PATIENT GOALS: improve symptoms  OBJECTIVE:  Note: Objective measures were completed at Evaluation unless otherwise noted.  DIAGNOSTIC FINDINGS:   11/04/2023 CT ANGIO NECK: " IMPRESSION: 1. No hemodynamically significant stenosis in the carotid arteries. Approximately 40% stenosis in the proximal left ICA. 2. Moderate stenosis in the distal left V2, although this may in part be artifactual, secondary to beam hardening artifact. No other hemodynamically significant stenosis in the vertebral arteries. 3. Aortic atherosclerosis.   Aortic Atherosclerosis (ICD10-I70.0).     Electronically Signed   By: Wiliam Ke M.D.   On: 11/04/2023 11:25"  09/03/2023 CT HEAD "IMPRESSION: No acute intracranial abnormality.     Electronically Signed   By: Minerva Fester M.D.   On: 09/03/2023 16:36"  COGNITION: Overall cognitive status: Within functional limits for tasks assessed   POSTURE:  Slight increase in kyphosis, stiff posture in standing and most notable with gait   GAIT: Gait pattern:  to be formally assessed future visit, but observed decrease in step-length and decrease in heel strike bilat, impaired balance with scanning Distance walked: clinic distances Assistive  device utilized: None Level of assistance: Complete Independence   FUNCTIONAL TESTS:  DGI - deferred to future visit   PATIENT SURVEYS:  DHI 36   VESTIBULAR ASSESSMENT:  OCULOMOTOR EXAM:  Ocular Alignment: normal  Ocular ROM: No Limitations - makes pt feel slightly lightheaded   Spontaneous Nystagmus: absent  Gaze-Induced Nystagmus: absent  Smooth Pursuits: intact  Saccades:  corrective saccade consistently with L gaze with horizontal testing - testing vertical and horizontal makes pt moderately dizzy  Convergence/Divergence: WNL   VESTIBULAR - OCULAR REFLEX:   Slow VOR: Normal - makes pt dizzy   VOR Cancellation: Normal - makes pt dizzy  Head-Impulse Test: Normal but makes pt dizzy   DVA- deferred    Modified Motion Sensitivity Test  Movement Intensity (change from baseline, 0-10, no change=0, severe change=10) Duration  <5 sec = 0  5-10 s = 1 11-20 s = 2 21-30 s = 3 >30 s = 4 Score (Intensity + Duration)  5x Horizontal head turns 0    5x Vertical head turns 5 20 sec 7  5x Right diagonal head turns (upper left quadrant down to right) 5 25 sec 8  5x Left diagonal head turns (upper right quadrant down to left) 0    5x Trunk Bends (bending knees  reaching to floor) 0    5x Right quarter body urns (look over right shoulder with trunk rotation, feet planted) 6 16 sec 8  5x Left quarter body turns (look over left shoulder with trunk rotation, feet planted) 0    1x 360 degree turn to right 7 >30 sec 11  1x 360 degree turn to left 0    5x VOR cancellation (follow thumbs horizontally with head/trunk rotation x45 degrees each way) 0    TOTAL SCORE      MSQ = total score x (# of positions)/14   0-10 mild range 11-30 moderate range 31-100 severe range   9.7 = mild    Comments: Pt with mild unsteadiness in standing when dizzy with MMS testing   OTHOSTATICS: deferred                                                                                                                              TREATMENT DATE: 01/14/24   Physical Performance: DGI - 18/24  NMR: Standing VORx1 - vertical, WBOS 2x30 sec - unsteady  Tandem stance 2x30 sec each LE - intermittent UE support SLB 2x30 sec each LE - intermittent UE support  TA: for mobility, gentle strengthening, symptom modulation Seated shrugs 10x bilat UE  Seated scapular squeezes 10x with 2-3 sec holds Seated chin tucks 2x10  Seated UT stretch 20 sec bilat - feels stiff   Self-care/home management: HEP updated/reviewed with pt:  Access Code: [email or web address] URL: [email or web address] Date: 01/14/2024 Prepared by: Temple Pacini  Exercises - Seated Gaze Stabilization with Head Nod  - 1 x daily - 7 x weekly - 6 sets - 1 reps - 30 sec hold - Standing Gaze Stabilization with Head Nod  - 1 x daily - 7 x weekly - 4 sets - 1 reps - 30 sec hold - Standing Tandem Balance with Counter Support  - 1 x daily - 7 x weekly - 2 sets - 1 reps - 30 sec each le  hold - Standing Single Leg Stance with Counter Support  - 1 x daily - 7 x weekly - 2 sets - 1 reps - 30 sec/leg hold    PATIENT EDUCATION: Education details: HEP, exercise technique Person educated: Patient Education method: IT trainer, verbal cue, handout  Education comprehension: verbalized understanding, returned demo  HOME EXERCISE PROGRAM:  Access Code: [email or web address] URL: [email or web address] Date: 01/05/2024 Prepared by: Temple Pacini  Exercises - Seated Gaze Stabilization with Head Nod  - 1 x daily - 7 x weekly - 6 sets - 1 reps - 30 sec hold  [employer] training video: grocery store. Please try to complete 2 minutes total daily, but only watch the video in 10-15 second intervals or as much as you can tolerate with only becoming mildly dizzy. Please then take a rest break until your symptoms come back down then try again.  Complete in a seated position.   [email or web address]   GOALS:  GOALS: Goals  reviewed with patient? Yes  SHORT TERM GOALS: Target date: 02/09/2024   Patient will be independent in home exercise program to improve strength/mobility for better functional independence with ADLs. Baseline: Goal status: INITIAL   LONG TERM GOALS: Target date: 03/22/2024    The patient will report at least 30% reduction in symptoms with using her computer or watching TV in order to increase ease with daily activities and improve QOL. Baseline: currently makes pt symptomatic  Goal status: INITIAL  2.  Patient will reduce dizziness handicap inventory score to <30, for less dizziness with ADLs and increased safety with home and work tasks.  Baseline: 36 Goal status: INITIAL  3.  Patient will increase ABC scale score >80% to demonstrate better functional mobility and better confidence with ADLs.  Baseline: 28.75%  Goal status: INITIAL  4.  Patient will increase dynamic gait index score to >19/24 as to demonstrate reduced fall risk and improved dynamic gait balance for better safety with community/home ambulation.   Baseline: deferred Goal status: INITIAL   ASSESSMENT:  CLINICAL IMPRESSION: DGI completed, pt scored 18/24, indicating fall risk. HEP updated and reviewed in session to further address and improve impairments. The pt will benefit from further skilled PT to address these deficits in order to decrease fall risk and improve QOL.  OBJECTIVE IMPAIRMENTS: Abnormal gait, decreased activity tolerance, decreased balance, decreased mobility, difficulty walking, dizziness, improper body mechanics, postural dysfunction, and pain.   ACTIVITY LIMITATIONS: standing, stairs, and locomotion level  PARTICIPATION LIMITATIONS: meal prep, driving, shopping, community activity, and yard work  PERSONAL FACTORS: Age, Past/current experiences, Sex, Time since onset of injury/illness/exacerbation, and 3+ comorbidities:  Per chart PMH significant for arthritis, breast CA, headache, HTN, hypothyroidism. L knee arthroplasty (2020)  are also affecting patient's functional outcome.   REHAB POTENTIAL: Good  CLINICAL DECISION MAKING: Evolving/moderate complexity  EVALUATION COMPLEXITY: Moderate   PLAN:  PT FREQUENCY: 1-2x/week  PT DURATION: 12 weeks  PLANNED INTERVENTIONS: 97164- PT Re-evaluation, 97110-Therapeutic exercises, 97530- Therapeutic activity, 97112- Neuromuscular re-education, 97535- Self Care, 82956- Manual therapy, [phone]- Gait training, [phone]- Orthotic Fit/training, [phone]- Canalith repositioning, Patient/Family education, Balance training, Stair training, Taping, Dry Needling, Joint mobilization, Spinal mobilization, Scar mobilization, Vestibular training, Visual/preceptual remediation/compensation, DME instructions, Cryotherapy, and Moist heat  PLAN FOR NEXT SESSION: orthostatics, initiate HEP, VORx1, balance   Baird Kay, PT 01/14/2024, 5:15 PM

## 2024-01-19 ENCOUNTER — Ambulatory Visit: Payer: Medicare HMO

## 2024-01-21 ENCOUNTER — Ambulatory Visit: Payer: Medicare HMO

## 2024-01-22 ENCOUNTER — Ambulatory Visit: Payer: Medicare HMO

## 2024-01-26 ENCOUNTER — Ambulatory Visit: Payer: Medicare HMO

## 2024-01-26 DIAGNOSIS — M542 Cervicalgia: Secondary | ICD-10-CM | POA: Diagnosis not present

## 2024-01-26 DIAGNOSIS — R42 Dizziness and giddiness: Secondary | ICD-10-CM

## 2024-01-26 DIAGNOSIS — R2681 Unsteadiness on feet: Secondary | ICD-10-CM | POA: Diagnosis not present

## 2024-01-26 DIAGNOSIS — R278 Other lack of coordination: Secondary | ICD-10-CM | POA: Diagnosis not present

## 2024-01-26 DIAGNOSIS — R262 Difficulty in walking, not elsewhere classified: Secondary | ICD-10-CM | POA: Diagnosis not present

## 2024-01-26 NOTE — Therapy (Signed)
 OUTPATIENT PHYSICAL THERAPY VESTIBULAR TREATMENT     Patient Name: Margaret Townsend MRN: [id number] DOB:17-Nov-1946, 78 y.o., female Today's Date: 01/26/2024  END OF SESSION:  PT End of Session - 01/26/24 1634     Visit Number 4    Number of Visits 25    Date for PT Re-Evaluation 03/22/24    PT Start Time 1103    PT Stop Time 1145    PT Time Calculation (min) 42 min    Equipment Utilized During Treatment Gait belt    Activity Tolerance Patient tolerated treatment well    Behavior During Therapy WFL for tasks assessed/performed               Past Medical History:  Diagnosis Date   Arthritis    Breast cancer (HCC)    Cancer (HCC)    breast   GERD (gastroesophageal reflux disease)    Headache    Hypertension    Hypothyroidism    PONV (postoperative nausea and vomiting)    Thyroid disease    Past Surgical History:  Procedure Laterality Date   ABDOMINAL HYSTERECTOMY     BACK SURGERY  2001   Lumbar   BREAST SURGERY     CATARACT EXTRACTION W/PHACO Left 05/06/2018   Procedure: CATARACT EXTRACTION PHACO AND INTRAOCULAR LENS PLACEMENT (IOC) LEFT;  Surgeon: Lockie Mola, MD;  Location: [facility];  Service: Ophthalmology;  Laterality: Left;  prefers to arrive around 9 am   CATARACT EXTRACTION W/PHACO Right 05/20/2018   Procedure: CATARACT EXTRACTION PHACO AND INTRAOCULAR LENS PLACEMENT (IOC) RIGHT  TORIC;  Surgeon: Lockie Mola, MD;  Location: [facility];  Service: Ophthalmology;  Laterality: Right;   CHOLECYSTECTOMY     DILATION AND CURETTAGE OF UTERUS     KNEE ARTHROPLASTY Left 12/01/2019   Procedure: COMPUTER ASSISTED TOTAL KNEE ARTHROPLASTY;  Surgeon: Donato Heinz, MD;  Location: [facility] ORS;  Service: Orthopedics;  Laterality: Left;   KNEE SURGERY Left 1976   MASTECTOMY Right 2015   breast cancer only treated surgically and with Letrozole   TONSILLECTOMY     Patient Active Problem List   Diagnosis Date Noted   Carotid stenosis  11/04/2023   Osteoarthritis of left knee 12/01/2019   Primary osteoarthritis of left knee 09/26/2019   Peripheral polyneuropathy 08/01/2019   Chronic pain of left knee 08/01/2019   Heart palpitations 08/17/2018   History of vitamin D deficiency 11/07/2017   Prediabetes 11/07/2017   Pure hypercholesterolemia 11/06/2017   Degenerative disc disease, cervical 09/21/2016   Allergic rhinitis, seasonal 11/13/2015   Avitaminosis D 11/13/2015   Essential (primary) hypertension 08/28/2015   Gastro-esophageal reflux disease without esophagitis 08/28/2015   Combined fat and carbohydrate induced hyperlipemia 08/28/2015   Malignant neoplasm of breast (HCC) 07/13/2014   HLD (hyperlipidemia) 05/09/2014   Inflamed nasal mucosa 05/09/2014   Acid reflux 04/06/2014   BP (high blood pressure) 04/06/2014   Adult hypothyroidism 04/06/2014   Degenerative arthritis of lumbar spine 04/06/2014    PCP: Patrice Paradise, MD  REFERRING PROVIDER: Gigi Gin, PA   REFERRING DIAG: R42 (ICD-10-CM) - Dizziness and giddiness  THERAPY DIAG:  Dizziness and giddiness  Cervicalgia  ONSET DATE: 2017  Rationale for Evaluation and Treatment: Rehabilitation  SUBJECTIVE:   SUBJECTIVE STATEMENT:  Pt reports 1 fall since last seen but didn't get hurt, occurred due to dog tripping her. She reports she thinks her thyroid medicine is making her feel bad. She thinks headaches are associated with thyroid medicine, she said this  increase happened after increase in medication dose, she plans to discuss with her physician. Pt also has noticed changes in hair and skin quality. Back of her head remains sore. She reports no recent dizziness. Pt reports a lot of tension in her neck.  Pt accompanied by: self  PERTINENT HISTORY:   Per chart & confirmed by pt: pt experiencing weakness, dizziness/lightheadedness, imbalance, blurry vision, tinnitus, with hx of bilateral carotid stenosis, occipital neuralgia, cervical  degenerative disc disease and anxiety. Sx onset following occipital nerve block 11/03/2023. 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 Hearing loss L ear. Found to have HF SN hearing loss. Negative BBPV testing per note 12/11/2023 from ENT, also repots pt with hx of hypofxn years ago. Hx of lower back surgery 1980s (per chart in 2001 to lumbar region). Per chart PMH significant for arthritis, breast CA, headache, HTN, hypothyroidism. L knee arthroplasty (2020)  PAIN:  Are you having pain? No not currently, but does endorse cervical soreness/pain at times  PRECAUTIONS: Fall  RED FLAGS: none  WEIGHT BEARING RESTRICTIONS: No  FALLS: Has patient fallen in last 6 months? Yes. Number of falls 1  LIVING ENVIRONMENT: Lives with: lives with their spouse Lives in: House/apartment Stairs:  stairs just going down into the yard 6, pt reports has handrails bilat,reports fall with hx of concussion prior to handrails (2017)  PLOF: Independent  PATIENT GOALS: improve symptoms  OBJECTIVE:  Note: Objective measures were completed at Evaluation unless otherwise noted.  DIAGNOSTIC FINDINGS:   11/04/2023 CT ANGIO NECK: " IMPRESSION: 1. No hemodynamically significant stenosis in the carotid arteries. Approximately 40% stenosis in the proximal left ICA. 2. Moderate stenosis in the distal left V2, although this may in part be artifactual, secondary to beam hardening artifact. No other hemodynamically significant stenosis in the vertebral arteries. 3. Aortic atherosclerosis.   Aortic Atherosclerosis (ICD10-I70.0).     Electronically Signed   By: Wiliam Ke M.D.   On: 11/04/2023 11:25"  09/03/2023 CT HEAD "IMPRESSION: No acute intracranial abnormality.     Electronically Signed   By: Minerva Fester M.D.   On: 09/03/2023 16:36"  COGNITION: Overall cognitive status: Within functional limits for tasks assessed   POSTURE:  Slight increase in kyphosis, stiff posture in standing and most  notable with gait   GAIT: Gait pattern:  to be formally assessed future visit, but observed decrease in step-length and decrease in heel strike bilat, impaired balance with scanning Distance walked: clinic distances Assistive device utilized: None Level of assistance: Complete Independence   FUNCTIONAL TESTS:  DGI - deferred to future visit   PATIENT SURVEYS:  DHI 36   VESTIBULAR ASSESSMENT:  OCULOMOTOR EXAM:  Ocular Alignment: normal  Ocular ROM: No Limitations - makes pt feel slightly lightheaded   Spontaneous Nystagmus: absent  Gaze-Induced Nystagmus: absent  Smooth Pursuits: intact  Saccades:  corrective saccade consistently with L gaze with horizontal testing - testing vertical and horizontal makes pt moderately dizzy  Convergence/Divergence: WNL   VESTIBULAR - OCULAR REFLEX:   Slow VOR: Normal - makes pt dizzy   VOR Cancellation: Normal - makes pt dizzy  Head-Impulse Test: Normal but makes pt dizzy   DVA- deferred    Modified Motion Sensitivity Test  Movement Intensity (change from baseline, 0-10, no change=0, severe change=10) Duration  <5 sec = 0  5-10 s = 1 11-20 s = 2 21-30 s = 3 >30 s = 4 Score (Intensity + Duration)  5x Horizontal head turns 0    5x Vertical  head turns 5 20 sec 7  5x Right diagonal head turns (upper left quadrant down to right) 5 25 sec 8  5x Left diagonal head turns (upper right quadrant down to left) 0    5x Trunk Bends (bending knees reaching to floor) 0    5x Right quarter body urns (look over right shoulder with trunk rotation, feet planted) 6 16 sec 8  5x Left quarter body turns (look over left shoulder with trunk rotation, feet planted) 0    1x 360 degree turn to right 7 >30 sec 11  1x 360 degree turn to left 0    5x VOR cancellation (follow thumbs horizontally with head/trunk rotation x45 degrees each way) 0    TOTAL SCORE      MSQ = total score x (# of positions)/14   0-10 mild range 11-30 moderate range 31-100 severe  range   9.7 = mild    Comments: Pt with mild unsteadiness in standing when dizzy with MMS testing   OTHOSTATICS: deferred                                                                                                                             TREATMENT DATE: 01/26/24   TA: Orthostatics assessment- Supine: 149/76 mmHg HR 71 Seated: 156/86 mmHg HR 69 Standing: 139/75 mmHg HR 74 Comments: no symptoms throughout  Supine on plinth: Chin tuck 10x R UT stretch x 30 sec Scapular squeezes 10x  Seated UT stretch 2x30 sec each side    HEP UPDATED:  Access Code: [email or web address] URL: [email or web address] Date: 01/26/2024 Prepared by: Temple Pacini  Exercises - Seated Upper Trapezius Stretch  - 1 x daily - 5 x weekly - 2 sets - 1 reps - 30 sec hold  Manual:  Supine on plinth, PT provides STM to R shoulder mm, UT, and cervical paraspinals      PATIENT EDUCATION: Education details: HEP, exercise technique Person educated: Patient Education method: IT trainer, verbal cue, handout  Education comprehension: verbalized understanding, returned demo  HOME EXERCISE PROGRAM:   01/26/24:  HEP UPDATED:  Access Code: [email or web address] URL: [email or web address] Date: 01/26/2024 Prepared by: Temple Pacini  Exercises - Seated Upper Trapezius Stretch  - 1 x daily - 5 x weekly - 2 sets - 1 reps - 30 sec hold   Access Code: [email or web address] URL: [email or web address] Date: 01/05/2024 Prepared by: Temple Pacini  Exercises - Seated Gaze Stabilization with Head Nod  - 1 x daily - 7 x weekly - 6 sets - 1 reps - 30 sec hold  Optokinetic training video: grocery store. Please try to complete 2 minutes total daily, but only watch the video in 10-15 second intervals or as much as you can tolerate with only becoming mildly dizzy. Please then take a rest break until your symptoms come back down then try again.  Complete in a seated position.   [email or web address]   GOALS:  GOALS: Goals reviewed with patient? Yes  SHORT  TERM GOALS: Target date: 02/09/2024   Patient will be independent in home exercise program to improve strength/mobility for better functional independence with ADLs. Baseline: Goal status: INITIAL   LONG TERM GOALS: Target date: 03/22/2024    The patient will report at least 30% reduction in symptoms with using her computer or watching TV in order to increase ease with daily activities and improve QOL. Baseline: currently makes pt symptomatic  Goal status: INITIAL  2.  Patient will reduce dizziness handicap inventory score to <30, for less dizziness with ADLs and increased safety with home and work tasks.  Baseline: 36 Goal status: INITIAL  3.  Patient will increase ABC scale score >80% to demonstrate better functional mobility and better confidence with ADLs.  Baseline: 28.75%  Goal status: INITIAL  4.  Patient will increase dynamic gait index score to >19/24 as to demonstrate reduced fall risk and improved dynamic gait balance for better safety with community/home ambulation.   Baseline: done previous visit 18/24 Goal status: INITIAL   ASSESSMENT:  CLINICAL IMPRESSION: Orthostatics assessed today: pt found to have significant diastolic BP drop, and 17 pt systolic BP drop with change in position seated>stand, but reported no symptoms. PT discussed these findings with pt, plan to reassess future session/will continue to monitor. Pt reported she is planning to review her thyroid medication with her physician since she thinks this might be related to her increased HA frequency. Possible plan to hold PT until she meets with her physician. The pt will benefit from further skilled PT to address these deficits in order to decrease fall risk and improve QOL.  OBJECTIVE IMPAIRMENTS: Abnormal gait, decreased activity tolerance, decreased balance, decreased mobility, difficulty  walking, dizziness, improper body mechanics, postural dysfunction, and pain.   ACTIVITY LIMITATIONS: standing, stairs, and locomotion level  PARTICIPATION LIMITATIONS: meal prep, driving, shopping, community activity, and yard work  PERSONAL FACTORS: Age, Past/current experiences, Sex, Time since onset of injury/illness/exacerbation, and 3+ comorbidities: Per chart PMH significant for arthritis, breast CA, headache, HTN, hypothyroidism. L knee arthroplasty (2020)  are also affecting patient's functional outcome.   REHAB POTENTIAL: Good  CLINICAL DECISION MAKING: Evolving/moderate complexity  EVALUATION COMPLEXITY: Moderate   PLAN:  PT FREQUENCY: 1-2x/week  PT DURATION: 12 weeks  PLANNED INTERVENTIONS: 97164- PT Re-evaluation, 97110-Therapeutic exercises, 97530- Therapeutic activity, 97112- Neuromuscular re-education, 97535- Self Care, 16109- Manual therapy, [phone]- Gait training, [phone]- Orthotic Fit/training, [phone]- Canalith repositioning, Patient/Family education, Balance training, Stair training, Taping, Dry Needling, Joint mobilization, Spinal mobilization, Scar mobilization, Vestibular training, Visual/preceptual remediation/compensation, DME instructions, Cryotherapy, and Moist heat  PLAN FOR NEXT SESSION: manual, VOR, balance, UE MMT, cervical mobility   Baird Kay, PT 01/26/2024, 4:42 PM

## 2024-01-29 ENCOUNTER — Ambulatory Visit: Payer: Medicare HMO

## 2024-02-02 ENCOUNTER — Ambulatory Visit: Payer: Medicare HMO

## 2024-02-02 DIAGNOSIS — R531 Weakness: Secondary | ICD-10-CM | POA: Diagnosis not present

## 2024-02-02 DIAGNOSIS — E039 Hypothyroidism, unspecified: Secondary | ICD-10-CM | POA: Diagnosis not present

## 2024-02-05 ENCOUNTER — Ambulatory Visit: Payer: Medicare HMO

## 2024-02-09 ENCOUNTER — Ambulatory Visit: Payer: Medicare HMO

## 2024-02-10 ENCOUNTER — Ambulatory Visit (INDEPENDENT_AMBULATORY_CARE_PROVIDER_SITE_OTHER): Payer: Medicare HMO

## 2024-02-10 ENCOUNTER — Ambulatory Visit (INDEPENDENT_AMBULATORY_CARE_PROVIDER_SITE_OTHER): Payer: Medicare HMO | Admitting: Vascular Surgery

## 2024-02-10 ENCOUNTER — Encounter (INDEPENDENT_AMBULATORY_CARE_PROVIDER_SITE_OTHER): Payer: Self-pay | Admitting: Vascular Surgery

## 2024-02-10 VITALS — BP 153/83 | HR 74 | Resp 16 | Wt 167.0 lb

## 2024-02-10 DIAGNOSIS — E782 Mixed hyperlipidemia: Secondary | ICD-10-CM | POA: Diagnosis not present

## 2024-02-10 DIAGNOSIS — I6523 Occlusion and stenosis of bilateral carotid arteries: Secondary | ICD-10-CM

## 2024-02-10 DIAGNOSIS — I10 Essential (primary) hypertension: Secondary | ICD-10-CM

## 2024-02-10 NOTE — Assessment & Plan Note (Signed)
 Carotid duplex today showed velocities consistent with a 1 to 39% ICA stenosis bilaterally which would suggest no progression from her previous disease.  Taking aspirin and a statin agent.  We can follow this annually with duplex.

## 2024-02-10 NOTE — Progress Notes (Signed)
 MRN : [id number]  Margaret Townsend is a 78 y.o. (12/27/1945) female who presents with chief complaint of  Chief Complaint  Patient presents with   Follow-up    3-4 month carotid follow up  .  History of Present Illness: Patient returns today in follow up of her carotid disease.  She is doing well today.  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  She has not had focal neurologic deficits that would suggest stroke or TIA such as arm or leg weakness or numbness, speech or swallowing difficulty, or temporary monocular blindness.  Carotid duplex today showed velocities consistent with a 1 to 39% ICA stenosis bilaterally which would suggest no progression from her previous disease.  Current Outpatient Medications  Medication Sig Dispense Refill   acetaminophen (TYLENOL) 500 MG tablet Take 250 mg by mouth every 6 (six) hours as needed (for pain).     amLODipine (NORVASC) 5 MG tablet Take 1 tablet by mouth daily.     aspirin EC 81 MG tablet Take 81 mg by mouth daily.     Cholecalciferol (VITAMIN D3) 50 MCG (2000 UT) TABS Take 2,000 Units by mouth daily in the afternoon.     diphenhydramine-acetaminophen (TYLENOL PM) 25-500 MG TABS tablet Take 0.5 tablets by mouth at bedtime as needed (pain.).      famotidine (PEPCID) 20 MG tablet Take 1 tablet (20 mg total) by mouth 2 (two) times daily as needed for heartburn or indigestion. 180 tablet 1   Green Tea Oil Fragrance OIL Take 1 drop by mouth daily.      hydrochlorothiazide (HYDRODIURIL) 25 MG tablet      letrozole (FEMARA) 2.5 MG tablet Take 2.5 mg by mouth daily before breakfast.      levothyroxine (SYNTHROID) 112 MCG tablet Take 1 tablet (112 mcg total) by mouth daily before breakfast. 30 tablet 5   losartan (COZAAR) 25 MG tablet Take 25 mg by mouth daily.     metoprolol tartrate (LOPRESSOR) 100 MG tablet Take tablet (100mg ) TWO hours prior to your cardiac CT scan. 1 tablet 0    Multiple Vitamin (MULTIVITAMIN WITH MINERALS) TABS tablet Take 1 tablet by mouth daily in the afternoon.     omeprazole (PRILOSEC) 20 MG capsule Take 20 mg by mouth at bedtime.     rosuvastatin (CRESTOR) 5 MG tablet Take 1 tablet (5 mg total) by mouth daily. (Patient taking differently: Take 5 mg by mouth every other day. At night) 90 tablet 3   vitamin B-12 (CYANOCOBALAMIN) 1000 MCG tablet Take 1,000 mcg by mouth daily in the afternoon.      vitamin E 400 UNIT capsule Take 1 capsule by mouth daily.     enoxaparin (LOVENOX) 40 MG/0.4ML injection Inject 0.4 mLs (40 mg total) into the skin daily. (Patient not taking: Reported on 02/10/2024) 5.6 mL 0   gabapentin (NEURONTIN) 100 MG capsule Take 1 capsule by mouth at bedtime. (Patient not taking: Reported on 02/10/2024)     losartan-hydrochlorothiazide (HYZAAR) 50-12.5 MG tablet Take 1 tablet by mouth daily. (Patient not taking: Reported on 02/10/2024) 90 tablet 3   oxyCODONE (ROXICODONE) 5 MG immediate release tablet Take 1-2 tablets (5-10 mg total) by mouth every 4 (four) hours as needed for severe pain. (Patient not taking: Reported on 02/10/2024) 30 tablet 0   traMADol (ULTRAM) 50 MG tablet Take 1 tablet (50 mg total) by mouth every 6 (six) hours as needed for  moderate pain. (Patient not taking: Reported on 02/10/2024) 30 tablet 0   No current facility-administered medications for this visit.    Past Medical History:  Diagnosis Date   Arthritis    Breast cancer (HCC)    Cancer (HCC)    breast   GERD (gastroesophageal reflux disease)    Headache    Hypertension    Hypothyroidism    PONV (postoperative nausea and vomiting)    Thyroid disease     Past Surgical History:  Procedure Laterality Date   ABDOMINAL HYSTERECTOMY     BACK SURGERY  2001   Lumbar   BREAST SURGERY     CATARACT EXTRACTION W/PHACO Left 05/06/2018   Procedure: CATARACT EXTRACTION PHACO AND INTRAOCULAR LENS PLACEMENT (IOC) LEFT;  Surgeon: Lockie Mola, MD;  Location:  [facility];  Service: Ophthalmology;  Laterality: Left;  prefers to arrive around 9 am   CATARACT EXTRACTION W/PHACO Right 05/20/2018   Procedure: CATARACT EXTRACTION PHACO AND INTRAOCULAR LENS PLACEMENT (IOC) RIGHT  TORIC;  Surgeon: Lockie Mola, MD;  Location: [facility];  Service: Ophthalmology;  Laterality: Right;   CHOLECYSTECTOMY     DILATION AND CURETTAGE OF UTERUS     KNEE ARTHROPLASTY Left 12/01/2019   Procedure: COMPUTER ASSISTED TOTAL KNEE ARTHROPLASTY;  Surgeon: Donato Heinz, MD;  Location: [facility] OR[facility];  Service: Orthopedics;  Laterality: Left;   KNEE SURGERY Left 1976   MASTECTOMY Right 2015   breast cancer only treated surgically and with Letrozole   TONSILLECTOMY       Social History   Tobacco Use   Smoking status: Never   Smokeless tobacco: Never  Vaping Use   Vaping status: Never Used  Substance Use Topics   Alcohol use: Never   Drug use: Never      Family History  Problem Relation Age of Onset   Stroke Mother 1   Ovarian cancer Mother    Pemphigus vulgaris Father    Aneurysm Sister        Brain   Hyperlipidemia Daughter    Thyroid disease Daughter 10     Allergies  Allergen Reactions   Codeine Shortness Of Breath   Oxycodone-Acetaminophen Shortness Of Breath, Nausea And Vomiting and Rash   Propoxyphene Shortness Of Breath, Nausea And Vomiting and Rash   Sulfamethoxazole-Trimethoprim Hives and Nausea Only   Albuterol Other (See Comments)    Passed out, increased bp   Demerol [Meperidine] Nausea And Vomiting   Dilaudid [Hydromorphone Hcl] Nausea And Vomiting   Iodinated Contrast Media Rash and Other (See Comments)    Passed out = this was some time ago and patient thinks she had it again without difficulty Passed out = this was some time ago and patient thinks she had it again without difficulty    Morphine And Codeine Nausea And Vomiting   Other Nausea And Vomiting   Sulfa Antibiotics Hives    Lisinopril-Hydrochlorothiazide Nausea Only and Swelling    Tongue swelling   Amoxicillin-Pot Clavulanate Nausea Only   Hydrocodone-Acetaminophen Rash   Levofloxacin Nausea Only   Promethazine Nausea And Vomiting   Tramadol Nausea And Vomiting    Patient states that she tolerates     REVIEW OF SYSTEMS (Negative unless checked)   Constitutional: [] Weight loss  [] Fever  [] Chills Cardiac: [] Chest pain   [] Chest pressure   [] Palpitations   [] Shortness of breath when laying flat   [] Shortness of breath at rest   [] Shortness of breath with exertion. Vascular:  [] Pain in legs with walking   []   Pain in legs at rest   [] Pain in legs when laying flat   [] Claudication   [] Pain in feet when walking  [] Pain in feet at rest  [] Pain in feet when laying flat   [] History of DVT   [] Phlebitis   [] Swelling in legs   [] Varicose veins   [] Non-healing ulcers Pulmonary:   [] Uses home oxygen   [] Productive cough   [] Hemoptysis   [] Wheeze  [] COPD   [] Asthma Neurologic:  [x] Dizziness  [x] Blackouts   [] Seizures   [] History of stroke   [] History of TIA  [] Aphasia   [] Temporary blindness   [] Dysphagia   [] Weakness or numbness in arms   [] Weakness or numbness in legs Musculoskeletal:  [x] Arthritis   [] Joint swelling   [x] Joint pain   [] Low back pain Hematologic:  [] Easy bruising  [] Easy bleeding   [] Hypercoagulable state   [] Anemic   Gastrointestinal:  [] Blood in stool   [] Vomiting blood  [x] Gastroesophageal reflux/heartburn   [] Abdominal pain Genitourinary:  [] Chronic kidney disease   [] Difficult urination  [] Frequent urination  [] Burning with urination   [] Hematuria Skin:  [] Rashes   [] Ulcers   [] Wounds Psychological:  [] History of anxiety   []  History of major depression.  Physical Examination  BP (!) 153/83   Pulse 74   Resp 16   Wt 167 lb (75.8 kg)   BMI 26.95 kg/m  Gen:  WD/WN, NAD Head: [place]/AT, No temporalis wasting. Ear/Nose/Throat: Hearing grossly intact, nares w/o erythema or drainage Eyes: Conjunctiva  clear. Sclera non-icteric Neck: Supple.  Trachea midline Pulmonary:  Good air movement, no use of accessory muscles.  Cardiac: RRR, no JVD Vascular:  Vessel Right Left  Radial Palpable Palpable               Musculoskeletal: M/S 5/5 throughout.  No deformity or atrophy. No edema. Neurologic: Sensation grossly intact in extremities.  Symmetrical.  Speech is fluent.  Psychiatric: Judgment intact, Mood & affect appropriate for pt's clinical situation. Dermatologic: No rashes or ulcers noted.  No cellulitis or open wounds.      Labs No results found for this or any previous visit (from the past 2160 hours).  Radiology No results found.  Assessment/Plan  Carotid stenosis Carotid duplex today showed velocities consistent with a 1 to 39% ICA stenosis bilaterally which would suggest no progression from her previous disease.  Taking aspirin and a statin agent.  We can follow this annually with duplex.  Essential (primary) hypertension blood pressure control important in reducing the progression of atherosclerotic disease. On appropriate oral medications.     HLD (hyperlipidemia) lipid control important in reducing the progression of atherosclerotic disease. On Crestor  Festus Barren, MD  02/10/2024 11:51 AM    This note was created with Dragon medical transcription system.  Any errors from dictation are purely unintentional

## 2024-02-11 ENCOUNTER — Ambulatory Visit: Payer: Medicare HMO

## 2024-02-16 ENCOUNTER — Ambulatory Visit: Payer: Medicare HMO

## 2024-02-18 ENCOUNTER — Ambulatory Visit: Payer: Medicare HMO

## 2024-02-19 DIAGNOSIS — R2689 Other abnormalities of gait and mobility: Secondary | ICD-10-CM | POA: Diagnosis not present

## 2024-02-19 DIAGNOSIS — R42 Dizziness and giddiness: Secondary | ICD-10-CM | POA: Diagnosis not present

## 2024-02-19 DIAGNOSIS — R0683 Snoring: Secondary | ICD-10-CM | POA: Diagnosis not present

## 2024-02-19 DIAGNOSIS — M7918 Myalgia, other site: Secondary | ICD-10-CM | POA: Diagnosis not present

## 2024-02-19 DIAGNOSIS — M5481 Occipital neuralgia: Secondary | ICD-10-CM | POA: Diagnosis not present

## 2024-02-23 ENCOUNTER — Ambulatory Visit: Payer: Medicare HMO

## 2024-02-24 DIAGNOSIS — M5481 Occipital neuralgia: Secondary | ICD-10-CM | POA: Diagnosis not present

## 2024-02-24 DIAGNOSIS — M7918 Myalgia, other site: Secondary | ICD-10-CM | POA: Diagnosis not present

## 2024-02-25 ENCOUNTER — Ambulatory Visit: Payer: Medicare HMO

## 2024-03-01 ENCOUNTER — Ambulatory Visit: Payer: Medicare HMO

## 2024-03-02 ENCOUNTER — Ambulatory Visit: Payer: Medicare HMO

## 2024-03-04 ENCOUNTER — Ambulatory Visit: Payer: Medicare HMO

## 2024-03-08 ENCOUNTER — Ambulatory Visit: Payer: Medicare HMO

## 2024-03-09 ENCOUNTER — Ambulatory Visit: Payer: Medicare HMO

## 2024-03-11 ENCOUNTER — Ambulatory Visit: Payer: Medicare HMO

## 2024-03-15 ENCOUNTER — Ambulatory Visit: Payer: Medicare HMO

## 2024-03-16 ENCOUNTER — Ambulatory Visit: Payer: Medicare HMO

## 2024-03-18 ENCOUNTER — Ambulatory Visit: Payer: Medicare HMO

## 2024-03-22 ENCOUNTER — Ambulatory Visit: Payer: Medicare HMO

## 2024-03-23 ENCOUNTER — Ambulatory Visit: Payer: Medicare HMO

## 2024-03-25 ENCOUNTER — Ambulatory Visit: Payer: Medicare HMO

## 2024-03-29 ENCOUNTER — Ambulatory Visit: Payer: Medicare HMO

## 2024-03-30 ENCOUNTER — Ambulatory Visit: Payer: Medicare HMO

## 2024-04-01 ENCOUNTER — Ambulatory Visit: Payer: Medicare HMO

## 2024-04-05 ENCOUNTER — Ambulatory Visit: Payer: Medicare HMO

## 2024-04-05 DIAGNOSIS — E782 Mixed hyperlipidemia: Secondary | ICD-10-CM | POA: Diagnosis not present

## 2024-04-05 DIAGNOSIS — R7303 Prediabetes: Secondary | ICD-10-CM | POA: Diagnosis not present

## 2024-04-05 DIAGNOSIS — R531 Weakness: Secondary | ICD-10-CM | POA: Diagnosis not present

## 2024-04-06 ENCOUNTER — Ambulatory Visit: Payer: Medicare HMO

## 2024-04-08 ENCOUNTER — Ambulatory Visit: Payer: Medicare HMO

## 2024-04-12 ENCOUNTER — Ambulatory Visit: Payer: Medicare HMO

## 2024-04-12 DIAGNOSIS — Z0000 Encounter for general adult medical examination without abnormal findings: Secondary | ICD-10-CM | POA: Diagnosis not present

## 2024-04-12 DIAGNOSIS — R55 Syncope and collapse: Secondary | ICD-10-CM | POA: Diagnosis not present

## 2024-04-12 DIAGNOSIS — R799 Abnormal finding of blood chemistry, unspecified: Secondary | ICD-10-CM | POA: Diagnosis not present

## 2024-04-12 DIAGNOSIS — R0683 Snoring: Secondary | ICD-10-CM | POA: Diagnosis not present

## 2024-04-12 DIAGNOSIS — M7918 Myalgia, other site: Secondary | ICD-10-CM | POA: Diagnosis not present

## 2024-04-12 DIAGNOSIS — M5030 Other cervical disc degeneration, unspecified cervical region: Secondary | ICD-10-CM | POA: Diagnosis not present

## 2024-04-12 DIAGNOSIS — E039 Hypothyroidism, unspecified: Secondary | ICD-10-CM | POA: Diagnosis not present

## 2024-04-12 DIAGNOSIS — M5481 Occipital neuralgia: Secondary | ICD-10-CM | POA: Diagnosis not present

## 2024-04-12 DIAGNOSIS — F411 Generalized anxiety disorder: Secondary | ICD-10-CM | POA: Diagnosis not present

## 2024-04-12 DIAGNOSIS — R7303 Prediabetes: Secondary | ICD-10-CM | POA: Diagnosis not present

## 2024-04-12 DIAGNOSIS — E782 Mixed hyperlipidemia: Secondary | ICD-10-CM | POA: Diagnosis not present

## 2024-04-13 ENCOUNTER — Ambulatory Visit: Payer: Medicare HMO

## 2024-04-15 ENCOUNTER — Ambulatory Visit: Payer: Medicare HMO

## 2024-04-19 ENCOUNTER — Ambulatory Visit: Payer: Medicare HMO

## 2024-04-20 ENCOUNTER — Encounter (INDEPENDENT_AMBULATORY_CARE_PROVIDER_SITE_OTHER): Payer: Self-pay

## 2024-04-20 ENCOUNTER — Ambulatory Visit: Payer: Medicare HMO

## 2024-04-22 ENCOUNTER — Ambulatory Visit: Payer: Medicare HMO

## 2024-04-27 ENCOUNTER — Ambulatory Visit: Payer: Medicare HMO

## 2024-04-28 ENCOUNTER — Ambulatory Visit: Payer: Medicare HMO

## 2024-04-29 ENCOUNTER — Ambulatory Visit: Payer: Medicare HMO

## 2024-05-03 ENCOUNTER — Ambulatory Visit: Payer: Medicare HMO

## 2024-05-04 ENCOUNTER — Ambulatory Visit: Payer: Medicare HMO

## 2024-05-06 ENCOUNTER — Ambulatory Visit: Payer: Medicare HMO

## 2024-05-10 ENCOUNTER — Ambulatory Visit: Payer: Medicare HMO

## 2024-05-11 ENCOUNTER — Ambulatory Visit: Payer: Medicare HMO

## 2024-05-13 ENCOUNTER — Ambulatory Visit: Payer: Medicare HMO

## 2024-05-17 ENCOUNTER — Ambulatory Visit: Payer: Medicare HMO

## 2024-05-18 ENCOUNTER — Ambulatory Visit: Payer: Medicare HMO

## 2024-05-20 ENCOUNTER — Ambulatory Visit: Payer: Medicare HMO

## 2024-05-20 DIAGNOSIS — Z13820 Encounter for screening for osteoporosis: Secondary | ICD-10-CM | POA: Diagnosis not present

## 2024-05-20 DIAGNOSIS — Z1231 Encounter for screening mammogram for malignant neoplasm of breast: Secondary | ICD-10-CM | POA: Diagnosis not present

## 2024-05-20 DIAGNOSIS — Z882 Allergy status to sulfonamides status: Secondary | ICD-10-CM | POA: Diagnosis not present

## 2024-05-20 DIAGNOSIS — Z79811 Long term (current) use of aromatase inhibitors: Secondary | ICD-10-CM | POA: Diagnosis not present

## 2024-05-20 DIAGNOSIS — Z170 Estrogen receptor positive status [ER+]: Secondary | ICD-10-CM | POA: Diagnosis not present

## 2024-05-20 DIAGNOSIS — C50811 Malignant neoplasm of overlapping sites of right female breast: Secondary | ICD-10-CM | POA: Diagnosis not present

## 2024-05-24 ENCOUNTER — Ambulatory Visit: Payer: Medicare HMO

## 2024-05-25 ENCOUNTER — Ambulatory Visit: Payer: Medicare HMO

## 2024-05-27 ENCOUNTER — Ambulatory Visit: Payer: Medicare HMO

## 2024-05-31 ENCOUNTER — Ambulatory Visit: Payer: Medicare HMO

## 2024-06-01 ENCOUNTER — Ambulatory Visit: Payer: Medicare HMO

## 2024-06-03 ENCOUNTER — Ambulatory Visit: Payer: Medicare HMO

## 2024-06-07 ENCOUNTER — Ambulatory Visit: Payer: Medicare HMO

## 2024-06-08 ENCOUNTER — Ambulatory Visit: Payer: Medicare HMO

## 2024-06-10 ENCOUNTER — Ambulatory Visit: Payer: Medicare HMO

## 2024-06-14 ENCOUNTER — Ambulatory Visit: Payer: Medicare HMO

## 2024-06-15 ENCOUNTER — Ambulatory Visit: Payer: Medicare HMO

## 2024-06-17 ENCOUNTER — Ambulatory Visit: Payer: Medicare HMO

## 2024-08-11 DIAGNOSIS — M79675 Pain in left toe(s): Secondary | ICD-10-CM | POA: Diagnosis not present

## 2024-08-13 DIAGNOSIS — M79675 Pain in left toe(s): Secondary | ICD-10-CM | POA: Diagnosis not present

## 2024-08-13 DIAGNOSIS — R7303 Prediabetes: Secondary | ICD-10-CM | POA: Diagnosis not present

## 2024-08-13 DIAGNOSIS — L600 Ingrowing nail: Secondary | ICD-10-CM | POA: Diagnosis not present

## 2024-08-13 DIAGNOSIS — M79674 Pain in right toe(s): Secondary | ICD-10-CM | POA: Diagnosis not present

## 2024-08-13 DIAGNOSIS — B351 Tinea unguium: Secondary | ICD-10-CM | POA: Diagnosis not present

## 2024-10-13 DIAGNOSIS — R2689 Other abnormalities of gait and mobility: Secondary | ICD-10-CM | POA: Diagnosis not present

## 2024-10-13 DIAGNOSIS — R42 Dizziness and giddiness: Secondary | ICD-10-CM | POA: Diagnosis not present

## 2024-10-13 DIAGNOSIS — M5481 Occipital neuralgia: Secondary | ICD-10-CM | POA: Diagnosis not present

## 2024-10-13 DIAGNOSIS — E538 Deficiency of other specified B group vitamins: Secondary | ICD-10-CM | POA: Diagnosis not present

## 2024-10-26 DIAGNOSIS — J0100 Acute maxillary sinusitis, unspecified: Secondary | ICD-10-CM | POA: Diagnosis not present

## 2024-10-26 DIAGNOSIS — E782 Mixed hyperlipidemia: Secondary | ICD-10-CM | POA: Diagnosis not present

## 2024-10-26 DIAGNOSIS — R7303 Prediabetes: Secondary | ICD-10-CM | POA: Diagnosis not present

## 2024-10-27 DIAGNOSIS — E538 Deficiency of other specified B group vitamins: Secondary | ICD-10-CM | POA: Diagnosis not present

## 2025-02-08 ENCOUNTER — Encounter (INDEPENDENT_AMBULATORY_CARE_PROVIDER_SITE_OTHER)

## 2025-02-08 ENCOUNTER — Ambulatory Visit (INDEPENDENT_AMBULATORY_CARE_PROVIDER_SITE_OTHER): Admitting: Vascular Surgery
